# Patient Record
Sex: Male | Born: 1955
Health system: Southern US, Community
[De-identification: ages and names within clinical notes are randomized; demographics above are authoritative.]

## PROBLEM LIST (undated history)

## (undated) DIAGNOSIS — L0390 Cellulitis, unspecified: Secondary | ICD-10-CM

## (undated) DIAGNOSIS — E66811 Obesity, class 1: Secondary | ICD-10-CM

## (undated) DIAGNOSIS — N189 Chronic kidney disease, unspecified: Secondary | ICD-10-CM

## (undated) DIAGNOSIS — I10 Essential (primary) hypertension: Secondary | ICD-10-CM

## (undated) DIAGNOSIS — E119 Type 2 diabetes mellitus without complications: Secondary | ICD-10-CM

## (undated) DIAGNOSIS — I639 Cerebral infarction, unspecified: Secondary | ICD-10-CM

## (undated) DIAGNOSIS — G479 Sleep disorder, unspecified: Secondary | ICD-10-CM

## (undated) DIAGNOSIS — M109 Gout, unspecified: Secondary | ICD-10-CM

## (undated) DIAGNOSIS — E669 Obesity, unspecified: Secondary | ICD-10-CM

## (undated) HISTORY — DX: Essential (primary) hypertension: I10

## (undated) HISTORY — DX: Obesity, unspecified: E66.9

## (undated) HISTORY — PX: TONSILLECTOMY: SUR1361

## (undated) HISTORY — PX: OTHER SURGICAL HISTORY: SHX169

## (undated) HISTORY — DX: Cellulitis, unspecified: L03.90

## (undated) HISTORY — DX: Gout, unspecified: M10.9

## (undated) HISTORY — DX: Sleep disorder, unspecified: G47.9

## (undated) HISTORY — DX: Type 2 diabetes mellitus without complications: E11.9

## (undated) HISTORY — DX: Chronic kidney disease, unspecified: N18.9

## (undated) HISTORY — DX: Obesity, class 1: E66.811

---

## 2003-02-05 ENCOUNTER — Ambulatory Visit (HOSPITAL_BASED_OUTPATIENT_CLINIC_OR_DEPARTMENT_OTHER): Admission: RE | Admit: 2003-02-05 | Discharge: 2003-02-05 | Payer: Self-pay | Admitting: Family Medicine

## 2012-01-05 DIAGNOSIS — I639 Cerebral infarction, unspecified: Secondary | ICD-10-CM

## 2012-01-05 HISTORY — DX: Cerebral infarction, unspecified: I63.9

## 2012-10-02 ENCOUNTER — Ambulatory Visit: Payer: Self-pay | Admitting: Neurology

## 2016-02-08 DIAGNOSIS — T23261A Burn of second degree of back of right hand, initial encounter: Secondary | ICD-10-CM | POA: Diagnosis not present

## 2016-03-15 DIAGNOSIS — M5136 Other intervertebral disc degeneration, lumbar region: Secondary | ICD-10-CM | POA: Diagnosis not present

## 2016-03-15 DIAGNOSIS — M9905 Segmental and somatic dysfunction of pelvic region: Secondary | ICD-10-CM | POA: Diagnosis not present

## 2016-03-15 DIAGNOSIS — M9903 Segmental and somatic dysfunction of lumbar region: Secondary | ICD-10-CM | POA: Diagnosis not present

## 2016-03-15 DIAGNOSIS — M5442 Lumbago with sciatica, left side: Secondary | ICD-10-CM | POA: Diagnosis not present

## 2016-03-19 DIAGNOSIS — M5430 Sciatica, unspecified side: Secondary | ICD-10-CM | POA: Diagnosis not present

## 2016-03-19 DIAGNOSIS — M545 Low back pain: Secondary | ICD-10-CM | POA: Diagnosis not present

## 2016-04-26 DIAGNOSIS — I639 Cerebral infarction, unspecified: Secondary | ICD-10-CM | POA: Diagnosis not present

## 2016-04-26 DIAGNOSIS — N183 Chronic kidney disease, stage 3 (moderate): Secondary | ICD-10-CM | POA: Diagnosis not present

## 2016-04-26 DIAGNOSIS — I10 Essential (primary) hypertension: Secondary | ICD-10-CM | POA: Diagnosis not present

## 2016-04-26 DIAGNOSIS — G459 Transient cerebral ischemic attack, unspecified: Secondary | ICD-10-CM | POA: Diagnosis not present

## 2016-04-26 DIAGNOSIS — R270 Ataxia, unspecified: Secondary | ICD-10-CM | POA: Insufficient documentation

## 2016-04-26 DIAGNOSIS — E1122 Type 2 diabetes mellitus with diabetic chronic kidney disease: Secondary | ICD-10-CM | POA: Diagnosis not present

## 2016-04-26 DIAGNOSIS — R4189 Other symptoms and signs involving cognitive functions and awareness: Secondary | ICD-10-CM | POA: Diagnosis not present

## 2016-04-26 DIAGNOSIS — N179 Acute kidney failure, unspecified: Secondary | ICD-10-CM | POA: Diagnosis not present

## 2016-04-26 DIAGNOSIS — M109 Gout, unspecified: Secondary | ICD-10-CM | POA: Diagnosis not present

## 2016-04-26 DIAGNOSIS — R479 Unspecified speech disturbances: Secondary | ICD-10-CM | POA: Diagnosis not present

## 2016-04-26 DIAGNOSIS — F430 Acute stress reaction: Secondary | ICD-10-CM | POA: Diagnosis not present

## 2016-04-26 DIAGNOSIS — I668 Occlusion and stenosis of other cerebral arteries: Secondary | ICD-10-CM | POA: Diagnosis not present

## 2016-04-26 DIAGNOSIS — I129 Hypertensive chronic kidney disease with stage 1 through stage 4 chronic kidney disease, or unspecified chronic kidney disease: Secondary | ICD-10-CM | POA: Diagnosis not present

## 2016-04-26 DIAGNOSIS — R4781 Slurred speech: Secondary | ICD-10-CM | POA: Diagnosis not present

## 2016-04-26 DIAGNOSIS — I080 Rheumatic disorders of both mitral and aortic valves: Secondary | ICD-10-CM | POA: Diagnosis not present

## 2016-04-26 DIAGNOSIS — I517 Cardiomegaly: Secondary | ICD-10-CM | POA: Diagnosis not present

## 2016-04-26 DIAGNOSIS — I663 Occlusion and stenosis of cerebellar arteries: Secondary | ICD-10-CM | POA: Diagnosis not present

## 2016-04-26 DIAGNOSIS — R260 Ataxic gait: Secondary | ICD-10-CM | POA: Diagnosis not present

## 2016-04-26 DIAGNOSIS — I6522 Occlusion and stenosis of left carotid artery: Secondary | ICD-10-CM | POA: Diagnosis not present

## 2016-04-26 DIAGNOSIS — Z789 Other specified health status: Secondary | ICD-10-CM | POA: Diagnosis not present

## 2016-04-26 DIAGNOSIS — R410 Disorientation, unspecified: Secondary | ICD-10-CM | POA: Diagnosis not present

## 2016-04-26 DIAGNOSIS — E669 Obesity, unspecified: Secondary | ICD-10-CM | POA: Diagnosis not present

## 2016-04-26 DIAGNOSIS — Z6831 Body mass index (BMI) 31.0-31.9, adult: Secondary | ICD-10-CM | POA: Diagnosis not present

## 2016-04-26 DIAGNOSIS — G4733 Obstructive sleep apnea (adult) (pediatric): Secondary | ICD-10-CM | POA: Diagnosis not present

## 2016-04-26 DIAGNOSIS — G9389 Other specified disorders of brain: Secondary | ICD-10-CM | POA: Diagnosis not present

## 2016-04-27 DIAGNOSIS — I517 Cardiomegaly: Secondary | ICD-10-CM | POA: Diagnosis not present

## 2016-04-27 DIAGNOSIS — I639 Cerebral infarction, unspecified: Secondary | ICD-10-CM | POA: Diagnosis not present

## 2016-04-27 DIAGNOSIS — I080 Rheumatic disorders of both mitral and aortic valves: Secondary | ICD-10-CM | POA: Diagnosis not present

## 2016-04-27 DIAGNOSIS — R270 Ataxia, unspecified: Secondary | ICD-10-CM | POA: Diagnosis not present

## 2016-04-27 DIAGNOSIS — N179 Acute kidney failure, unspecified: Secondary | ICD-10-CM | POA: Diagnosis not present

## 2016-04-27 DIAGNOSIS — E119 Type 2 diabetes mellitus without complications: Secondary | ICD-10-CM | POA: Insufficient documentation

## 2016-04-27 DIAGNOSIS — E1122 Type 2 diabetes mellitus with diabetic chronic kidney disease: Secondary | ICD-10-CM | POA: Insufficient documentation

## 2016-04-27 DIAGNOSIS — R410 Disorientation, unspecified: Secondary | ICD-10-CM | POA: Diagnosis not present

## 2016-04-28 DIAGNOSIS — E669 Obesity, unspecified: Secondary | ICD-10-CM | POA: Insufficient documentation

## 2016-04-28 DIAGNOSIS — Z7289 Other problems related to lifestyle: Secondary | ICD-10-CM | POA: Insufficient documentation

## 2016-04-28 DIAGNOSIS — N179 Acute kidney failure, unspecified: Secondary | ICD-10-CM | POA: Diagnosis not present

## 2016-04-28 DIAGNOSIS — F430 Acute stress reaction: Secondary | ICD-10-CM | POA: Insufficient documentation

## 2016-04-28 DIAGNOSIS — Z789 Other specified health status: Secondary | ICD-10-CM | POA: Diagnosis not present

## 2016-04-28 DIAGNOSIS — R270 Ataxia, unspecified: Secondary | ICD-10-CM | POA: Diagnosis not present

## 2016-04-28 DIAGNOSIS — I10 Essential (primary) hypertension: Secondary | ICD-10-CM | POA: Insufficient documentation

## 2016-04-28 DIAGNOSIS — I639 Cerebral infarction, unspecified: Secondary | ICD-10-CM | POA: Diagnosis not present

## 2016-04-29 DIAGNOSIS — Z789 Other specified health status: Secondary | ICD-10-CM | POA: Diagnosis not present

## 2016-04-29 DIAGNOSIS — I672 Cerebral atherosclerosis: Secondary | ICD-10-CM | POA: Insufficient documentation

## 2016-04-29 DIAGNOSIS — N179 Acute kidney failure, unspecified: Secondary | ICD-10-CM | POA: Diagnosis not present

## 2016-04-29 DIAGNOSIS — R270 Ataxia, unspecified: Secondary | ICD-10-CM | POA: Diagnosis not present

## 2016-04-29 DIAGNOSIS — I639 Cerebral infarction, unspecified: Secondary | ICD-10-CM | POA: Diagnosis not present

## 2016-04-29 DIAGNOSIS — G4733 Obstructive sleep apnea (adult) (pediatric): Secondary | ICD-10-CM | POA: Insufficient documentation

## 2016-05-05 DIAGNOSIS — Z8673 Personal history of transient ischemic attack (TIA), and cerebral infarction without residual deficits: Secondary | ICD-10-CM | POA: Diagnosis not present

## 2016-05-05 DIAGNOSIS — Z6832 Body mass index (BMI) 32.0-32.9, adult: Secondary | ICD-10-CM | POA: Diagnosis not present

## 2016-05-05 DIAGNOSIS — E1165 Type 2 diabetes mellitus with hyperglycemia: Secondary | ICD-10-CM | POA: Diagnosis not present

## 2016-05-05 DIAGNOSIS — Z1389 Encounter for screening for other disorder: Secondary | ICD-10-CM | POA: Diagnosis not present

## 2016-05-05 DIAGNOSIS — I10 Essential (primary) hypertension: Secondary | ICD-10-CM | POA: Diagnosis not present

## 2016-05-27 DIAGNOSIS — M109 Gout, unspecified: Secondary | ICD-10-CM | POA: Diagnosis not present

## 2016-05-27 DIAGNOSIS — Z6831 Body mass index (BMI) 31.0-31.9, adult: Secondary | ICD-10-CM | POA: Diagnosis not present

## 2016-05-27 DIAGNOSIS — Q846 Other congenital malformations of nails: Secondary | ICD-10-CM | POA: Diagnosis not present

## 2016-05-27 DIAGNOSIS — G479 Sleep disorder, unspecified: Secondary | ICD-10-CM | POA: Diagnosis not present

## 2016-05-27 DIAGNOSIS — E7800 Pure hypercholesterolemia, unspecified: Secondary | ICD-10-CM | POA: Diagnosis not present

## 2016-05-27 DIAGNOSIS — R5383 Other fatigue: Secondary | ICD-10-CM | POA: Diagnosis not present

## 2016-05-27 DIAGNOSIS — I10 Essential (primary) hypertension: Secondary | ICD-10-CM | POA: Diagnosis not present

## 2016-06-08 DIAGNOSIS — D485 Neoplasm of uncertain behavior of skin: Secondary | ICD-10-CM | POA: Diagnosis not present

## 2016-06-08 DIAGNOSIS — L608 Other nail disorders: Secondary | ICD-10-CM | POA: Diagnosis not present

## 2016-06-08 DIAGNOSIS — L570 Actinic keratosis: Secondary | ICD-10-CM | POA: Diagnosis not present

## 2016-08-12 DIAGNOSIS — Z6833 Body mass index (BMI) 33.0-33.9, adult: Secondary | ICD-10-CM | POA: Diagnosis not present

## 2016-08-12 DIAGNOSIS — M109 Gout, unspecified: Secondary | ICD-10-CM | POA: Diagnosis not present

## 2016-08-16 DIAGNOSIS — Z6833 Body mass index (BMI) 33.0-33.9, adult: Secondary | ICD-10-CM | POA: Diagnosis not present

## 2016-08-16 DIAGNOSIS — Z0000 Encounter for general adult medical examination without abnormal findings: Secondary | ICD-10-CM | POA: Diagnosis not present

## 2016-08-16 DIAGNOSIS — R9431 Abnormal electrocardiogram [ECG] [EKG]: Secondary | ICD-10-CM | POA: Diagnosis not present

## 2016-09-10 DIAGNOSIS — E7800 Pure hypercholesterolemia, unspecified: Secondary | ICD-10-CM

## 2016-09-10 DIAGNOSIS — R9431 Abnormal electrocardiogram [ECG] [EKG]: Secondary | ICD-10-CM

## 2016-09-10 DIAGNOSIS — G479 Sleep disorder, unspecified: Secondary | ICD-10-CM

## 2016-09-10 DIAGNOSIS — I10 Essential (primary) hypertension: Secondary | ICD-10-CM

## 2016-09-14 ENCOUNTER — Encounter: Payer: Self-pay | Admitting: Cardiology

## 2016-09-14 ENCOUNTER — Ambulatory Visit (INDEPENDENT_AMBULATORY_CARE_PROVIDER_SITE_OTHER): Payer: BLUE CROSS/BLUE SHIELD | Admitting: Cardiology

## 2016-09-14 VITALS — BP 190/80 | HR 108 | Resp 14 | Ht 70.0 in | Wt 227.1 lb

## 2016-09-14 DIAGNOSIS — G479 Sleep disorder, unspecified: Secondary | ICD-10-CM

## 2016-09-14 DIAGNOSIS — R9431 Abnormal electrocardiogram [ECG] [EKG]: Secondary | ICD-10-CM

## 2016-09-14 DIAGNOSIS — E7800 Pure hypercholesterolemia, unspecified: Secondary | ICD-10-CM | POA: Diagnosis not present

## 2016-09-14 DIAGNOSIS — I10 Essential (primary) hypertension: Secondary | ICD-10-CM

## 2016-09-14 NOTE — Patient Instructions (Signed)
Medication Instructions:  Your physician recommends that you continue on your current medications as directed. Please refer to the Current Medication list given to you today.  Labwork: None   Testing/Procedures: Your physician has requested that you have an echocardiogram. Echocardiography is a painless test that uses sound waves to create images of your heart. It provides your doctor with information about the size and shape of your heart and how well your heart's chambers and valves are working. This procedure takes approximately one hour. There are no restrictions for this procedure.   Follow-Up: Your physician recommends that you schedule a follow-up appointment in: 3 weeks   Any Other Special Instructions Will Be Listed Below (If Applicable).  Please note that any paperwork needing to be filled out by the provider will need to be addressed at the front desk prior to seeing the provider. Please note that any paperwork FMLA, Disability or other documents regarding health condition is subject to a $25.00 charge that must be received prior to completion of paperwork in the form of a money order or check.    If you need a refill on your cardiac medications before your next appointment, please call your pharmacy.

## 2016-09-14 NOTE — Progress Notes (Signed)
Cardiology Consultation:    Date:  09/14/2016   ID:  [name] [name], DOB 18-Mar-1955, MRN [id number]  PCP:  Noni Saupeedding, John F. II, MD  Cardiologist:  Gypsy Balsamobert Jaquin Coy, MD   Referring MD: Noni Saupeedding, John F. II, MD   Chief Complaint  Patient presents with  . Abnormal ECG  Is doing well he's here to talk about abnormal EKG  History of Present Illness:    [name] [name] is a 61 y.o. male who is being seen today for the evaluation of Abnormal EKG at the request of Arvin Collardedding, John F. II, MD. Patient has a history of long-standing hypertension he tells me for at least 17 years, dyslipidemia, mild sleep apnea. It have his annual physical was fine to have abnormal EKG with some ischemic changes and he was referred to me for evaluation. He denies having tightness squeezing pressure burning chest but that the same time he tells me he does not exercise much. 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 He does not exercise on the radial basis. Years ago he was diagnosed with mild sleep apnea however no treatment initiated. He described to have some exertional shortness of breath as well as some swelling of lower extremities evening time. No dizziness no syncope.  Past Medical History:  Diagnosis Date  . Hypertension   . Obesity (BMI 30.0-34.9)   . Sleep disorder     Past Surgical History:  Procedure Laterality Date  . Arthroscopic knee surgery      Current Medications: Current Meds  Medication Sig  . allopurinol (ZYLOPRIM) 100 MG tablet Take 200 mg by mouth daily.  Marland Kitchen. ALPRAZolam (XANAX) 0.25 MG tablet Take 0.25 mg by mouth as needed for anxiety.  Marland Kitchen. amLODipine (NORVASC) 10 MG tablet Take 10 mg by mouth daily.  Marland Kitchen. aspirin 81 MG chewable tablet Chew 81 mg by mouth daily.  Marland Kitchen. lisinopril (PRINIVIL,ZESTRIL) 40 MG tablet Take 40 mg by mouth daily.  . meloxicam (MOBIC) 15 MG tablet Take 15 mg by mouth daily.  . metoprolol (TOPROL-XL) 200 MG 24 hr tablet Take 200 mg by mouth  daily.  . nitroGLYCERIN (NITROSTAT) 0.6 MG SL tablet Place 0.6 mg under the tongue every 5 (five) minutes as needed for chest pain.     Allergies:   Patient has no known allergies.   Social History   Social History  . Marital status: Unknown    Spouse name: N/A  . Number of children: N/A  . Years of education: N/A   Social History Main Topics  . Smoking status: Never Smoker  . Smokeless tobacco: Never Used  . Alcohol use Yes  . Drug use: No  . Sexual activity: Not Asked   Other Topics Concern  . None   Social History Narrative  . None     Family History: The patient's family history includes Hypertension in his father and mother. ROS:   Please see the history of present illness.    All 14 point review of systems negative except as described per history of present illness.  EKGs/Labs/Other Studies Reviewed:     Recent Labs: No results found for requested labs within last 8760 hours.  Recent Lipid Panel No results found for: CHOL, TRIG, HDL, CHOLHDL, VLDL, LDLCALC, LDLDIRECT  Physical Exam:    VS:  BP (!) 190/80   Pulse (!) 108   Resp 14   Ht 5\' 10"  (1.778 m)   Wt 227 lb 1.9 oz (103 kg)  BMI 32.59 kg/m     Wt Readings from Last 3 Encounters:  09/14/16 227 lb 1.9 oz (103 kg)     GEN:  Well nourished, well developed in no acute distress HEENT: Normal NECK: No JVD; No carotid bruits LYMPHATICS: No lymphadenopathy CARDIAC: RRR, no murmurs, no rubs, no gallops RESPIRATORY:  Clear to auscultation without rales, wheezing or rhonchi  ABDOMEN: Soft, non-tender, non-distended MUSCULOSKELETAL:  No edema; No deformity  SKIN: Warm and dry NEUROLOGIC:  Alert and oriented x 3 PSYCHIATRIC:  Normal affect   ASSESSMENT:    1. Benign essential hypertension   2. Abnormal EKG   3. Elevated cholesterol   4. Sleep disorder    PLAN:    In order of problems listed above:  1. Benign essential hypertension: His blood pressure is high today but this is first visit to  my office. I asked him to recheck his blood pressure on the radial basis and taken out of it. I will continue present medications which include metoprolol lisinopril as well as amlodipine. 2. Abnormal EKG: EKG will be repeated. Those changes on the EKG could be related to ischemia or left ventricle hypertrophy. I will ask him to have echocardiogram to assess left ventricular ejection fraction and left ventricle hypertrophy. I'll be also interested in looking for right ventricle pressures. 3. Lipidemia: I will call primary care physician to get his fasting lipid profile. He is not on any therapy. 4. Obstructive sleep apnea: Apparently mild does not have any treatment for it we'll talk about this in the future. He may benefit from CPAP mask if blood pressure is truly elevated and does some target organ damage like left ventricle hypertrophy.  I will see him in about 2-3 weeks. Hopefully by that time we'll have echocardiogram done, and also he will bring the blood pressure measurements so we can adjust his medications accordingly.   Medication Adjustments/Labs and Tests Ordered: Current medicines are reviewed at length with the patient today.  Concerns regarding medicines are outlined above.  No orders of the defined types were placed in this encounter.  No orders of the defined types were placed in this encounter.   Signed, Georgeanna Lea, MD, [facility] 09/14/2016 10:32 AM    [facility]

## 2016-09-22 ENCOUNTER — Ambulatory Visit (HOSPITAL_BASED_OUTPATIENT_CLINIC_OR_DEPARTMENT_OTHER)
Admission: RE | Admit: 2016-09-22 | Discharge: 2016-09-22 | Disposition: A | Discharge status: Home / Self Care | Payer: BLUE CROSS/BLUE SHIELD | Source: Ambulatory Visit | Attending: Cardiology | Admitting: Cardiology

## 2016-09-22 DIAGNOSIS — I081 Rheumatic disorders of both mitral and tricuspid valves: Secondary | ICD-10-CM | POA: Insufficient documentation

## 2016-09-22 DIAGNOSIS — R9431 Abnormal electrocardiogram [ECG] [EKG]: Secondary | ICD-10-CM | POA: Insufficient documentation

## 2016-10-05 ENCOUNTER — Ambulatory Visit: Payer: BLUE CROSS/BLUE SHIELD | Admitting: Cardiology

## 2016-10-08 ENCOUNTER — Ambulatory Visit: Payer: BLUE CROSS/BLUE SHIELD | Admitting: Cardiology

## 2016-10-14 ENCOUNTER — Ambulatory Visit (INDEPENDENT_AMBULATORY_CARE_PROVIDER_SITE_OTHER): Payer: BLUE CROSS/BLUE SHIELD | Admitting: Cardiology

## 2016-10-14 ENCOUNTER — Encounter: Payer: Self-pay | Admitting: Cardiology

## 2016-10-14 VITALS — BP 170/92 | HR 76 | Resp 14 | Ht 70.0 in | Wt 227.0 lb

## 2016-10-14 DIAGNOSIS — R0789 Other chest pain: Secondary | ICD-10-CM | POA: Insufficient documentation

## 2016-10-14 DIAGNOSIS — G479 Sleep disorder, unspecified: Secondary | ICD-10-CM | POA: Diagnosis not present

## 2016-10-14 DIAGNOSIS — I10 Essential (primary) hypertension: Secondary | ICD-10-CM | POA: Diagnosis not present

## 2016-10-14 DIAGNOSIS — R9431 Abnormal electrocardiogram [ECG] [EKG]: Secondary | ICD-10-CM | POA: Diagnosis not present

## 2016-10-14 DIAGNOSIS — E7800 Pure hypercholesterolemia, unspecified: Secondary | ICD-10-CM | POA: Diagnosis not present

## 2016-10-14 MED ORDER — ATORVASTATIN CALCIUM 20 MG PO TABS: 20.0000 mg | ORAL_TABLET | Freq: Every day | ORAL | 6 refills | Status: DC

## 2016-10-14 NOTE — Progress Notes (Signed)
Cardiology Office Note:    Date:  10/14/2016   ID:  [name] [name], DOB 02/03/1955, MRN [id number]  PCP:  Noni Saupe, MD  Cardiologist:  Gypsy Balsam, MD    Referring MD: Noni Saupe, MD   Chief Complaint  Patient presents with  . 3 week follow up  I'm feeling fine  History of Present Illness:    [name] [name] is a 61 y.o. male  with hypertension, abnormal EKG. He said he is feeling well according on was done which showed significant left ventricle hypertrophy. Clearly we need to improve management of his hypertension. I will ask him to have Chem-7 done today Chem-7 is okay we will initiate small dose of diuretic. If that will not be sufficient to get his blood pressure were supposed to be will try to readjust dose of clonidine. He is already on ACE inhibitor as well as calcium channel blocker and beta blocker. He described to have an fatigued quite easily and some on his sensation to chest, therefore I will schedule him to have a stress test. It will be done in form of stress echocardiogram. I reviewed his cholesterol. He needs to be at least moderate intensity stopped and I will initiate therapy with Lipitor 20 mg daily.  Past Medical History:  Diagnosis Date  . Hypertension   . Obesity (BMI 30.0-34.9)   . Sleep disorder     Past Surgical History:  Procedure Laterality Date  . Arthroscopic knee surgery      Current Medications: Current Meds  Medication Sig  . allopurinol (ZYLOPRIM) 100 MG tablet Take 200 mg by mouth daily.  Marland Kitchen amLODipine (NORVASC) 10 MG tablet Take 10 mg by mouth daily.  . cloNIDine (CATAPRES) 0.2 MG tablet Take 0.2 mg by mouth daily.  Marland Kitchen lisinopril (PRINIVIL,ZESTRIL) 40 MG tablet Take 40 mg by mouth daily.  . meloxicam (MOBIC) 15 MG tablet Take 15 mg by mouth daily.  . metoprolol (TOPROL-XL) 200 MG 24 hr tablet Take 200 mg by mouth daily.     Allergies:   Patient has no known allergies.   Social History   Social History  .  Marital status: Married    Spouse name: N/A  . Number of children: N/A  . Years of education: N/A   Social History Main Topics  . Smoking status: Never Smoker  . Smokeless tobacco: Never Used  . Alcohol use Yes  . Drug use: No  . Sexual activity: Not Asked   Other Topics Concern  . None   Social History Narrative  . None     Family History: The patient's family history includes Hypertension in his father and mother. ROS:   Please see the history of present illness.    All 14 point review of systems negative except as described per history of present illness  EKGs/Labs/Other Studies Reviewed:      Recent Labs: No results found for requested labs within last 8760 hours.  Recent Lipid Panel No results found for: CHOL, TRIG, HDL, CHOLHDL, VLDL, LDLCALC, LDLDIRECT  Physical Exam:    VS:  BP (!) 170/92   Pulse 76   Resp 14   Ht  (1.778 m)   Wt 227 lb (103 kg)   BMI 32.57 kg/m     Wt Readings from Last 3 Encounters:  10/14/16 227 lb (103 kg)  09/14/16 227 lb 1.9 oz (103 kg)     GEN:  Well nourished, well developed in no acute  distress HEENT: Normal NECK: No JVD; No carotid bruits LYMPHATICS: No lymphadenopathy CARDIAC: RRR, no murmurs, no rubs, no gallops RESPIRATORY:  Clear to auscultation without rales, wheezing or rhonchi  ABDOMEN: Soft, non-tender, non-distended MUSCULOSKELETAL:  No edema; No deformity  SKIN: Warm and dry LOWER EXTREMITIES: no swelling NEUROLOGIC:  Alert and oriented x 3 PSYCHIATRIC:  Normal affect   ASSESSMENT:    1. Benign essential hypertension   2. Elevated cholesterol   3. Abnormal EKG   4. Sleep disorder   5. Atypical chest pain    PLAN:    In order of problems listed above:  1. Benign essential hypertension: Chem-7 will be done if Chem-7 is 5 we'll initiate diuretic. 2. Elevated cholesterol: We will restart statin. 3. Abnormal EKG: Most likely left ventricle hypertrophy related but stressed this will be done to  rule out any inducible ischemia. 4. Sleep disorder: Followed by internal medicine team. 5. Atypical chest pain: Stressed test will be done.  I see him back in one month or sooner if he has a problem   Medication Adjustments/Labs and Tests Ordered: Current medicines are reviewed at length with the patient today.  Concerns regarding medicines are outlined above.  No orders of the defined types were placed in this encounter.  Medication changes: No orders of the defined types were placed in this encounter.   Signed, Georgeanna Lea, MD, [facility] 10/14/2016 9:30 AM    [facility]

## 2016-10-14 NOTE — Patient Instructions (Addendum)
Medication Instructions:  Your physician has recommended you make the following change in your medication:  1.) START Lipitor 20 mg daily.   Labwork: Your physician recommends that you have lab work today to check your Kidney function as well as sodium level and potassium.   Testing/Procedures: Your physician has requested that you have a stress echocardiogram. For further information please visit [email or web address] Please follow instruction sheet as given.   Follow-Up: Your physician recommends that you schedule a follow-up appointment in: 1 month   Any Other Special Instructions Will Be Listed Below (If Applicable).  Please note that any paperwork needing to be filled out by the provider will need to be addressed at the front desk prior to seeing the provider. Please note that any paperwork FMLA, Disability or other documents regarding health condition is subject to a $25.00 charge that must be received prior to completion of paperwork in the form of a money order or check.    If you need a refill on your cardiac medications before your next appointment, please call your pharmacy.

## 2016-10-15 LAB — BASIC METABOLIC PANEL
BUN/Creatinine Ratio: 17 (ref 9–20)
BUN: 23 mg/dL (ref 6–24)
CALCIUM: 9.4 mg/dL (ref 8.7–10.2)
CHLORIDE: 104 mmol/L (ref 96–106)
CO2: 27 mmol/L (ref 20–29)
Creatinine, Ser: 1.34 mg/dL — ABNORMAL HIGH (ref 0.76–1.27)
GFR calc non Af Amer: 58 mL/min/{1.73_m2} — ABNORMAL LOW (ref >59)
GFR, EST AFRICAN AMERICAN: 67 mL/min/{1.73_m2} (ref >59)
GLUCOSE: 177 mg/dL — AB (ref 65–99)
Potassium: 4.2 mmol/L (ref 3.5–5.2)
Sodium: 145 mmol/L — ABNORMAL HIGH (ref 134–144)

## 2016-11-09 DIAGNOSIS — R0603 Acute respiratory distress: Secondary | ICD-10-CM | POA: Diagnosis not present

## 2016-11-09 DIAGNOSIS — I130 Hypertensive heart and chronic kidney disease with heart failure and stage 1 through stage 4 chronic kidney disease, or unspecified chronic kidney disease: Secondary | ICD-10-CM | POA: Diagnosis not present

## 2016-11-09 DIAGNOSIS — Z79899 Other long term (current) drug therapy: Secondary | ICD-10-CM | POA: Diagnosis not present

## 2016-11-09 DIAGNOSIS — G8191 Hemiplegia, unspecified affecting right dominant side: Secondary | ICD-10-CM | POA: Diagnosis not present

## 2016-11-09 DIAGNOSIS — Z9181 History of falling: Secondary | ICD-10-CM | POA: Diagnosis not present

## 2016-11-09 DIAGNOSIS — M25461 Effusion, right knee: Secondary | ICD-10-CM | POA: Diagnosis not present

## 2016-11-09 DIAGNOSIS — E7800 Pure hypercholesterolemia, unspecified: Secondary | ICD-10-CM | POA: Diagnosis not present

## 2016-11-09 DIAGNOSIS — G459 Transient cerebral ischemic attack, unspecified: Secondary | ICD-10-CM | POA: Diagnosis not present

## 2016-11-09 DIAGNOSIS — R29818 Other symptoms and signs involving the nervous system: Secondary | ICD-10-CM | POA: Diagnosis not present

## 2016-11-09 DIAGNOSIS — M109 Gout, unspecified: Secondary | ICD-10-CM | POA: Diagnosis not present

## 2016-11-09 DIAGNOSIS — I6523 Occlusion and stenosis of bilateral carotid arteries: Secondary | ICD-10-CM | POA: Diagnosis not present

## 2016-11-09 DIAGNOSIS — Z8673 Personal history of transient ischemic attack (TIA), and cerebral infarction without residual deficits: Secondary | ICD-10-CM | POA: Diagnosis not present

## 2016-11-09 DIAGNOSIS — I6350 Cerebral infarction due to unspecified occlusion or stenosis of unspecified cerebral artery: Secondary | ICD-10-CM | POA: Diagnosis not present

## 2016-11-09 DIAGNOSIS — I5031 Acute diastolic (congestive) heart failure: Secondary | ICD-10-CM | POA: Diagnosis not present

## 2016-11-09 DIAGNOSIS — R29700 NIHSS score 0: Secondary | ICD-10-CM | POA: Diagnosis not present

## 2016-11-09 DIAGNOSIS — I10 Essential (primary) hypertension: Secondary | ICD-10-CM | POA: Diagnosis not present

## 2016-11-09 DIAGNOSIS — N183 Chronic kidney disease, stage 3 (moderate): Secondary | ICD-10-CM | POA: Diagnosis not present

## 2016-11-09 DIAGNOSIS — E785 Hyperlipidemia, unspecified: Secondary | ICD-10-CM | POA: Diagnosis not present

## 2016-11-09 DIAGNOSIS — E1122 Type 2 diabetes mellitus with diabetic chronic kidney disease: Secondary | ICD-10-CM | POA: Diagnosis not present

## 2016-11-09 DIAGNOSIS — I6789 Other cerebrovascular disease: Secondary | ICD-10-CM | POA: Diagnosis not present

## 2016-11-09 DIAGNOSIS — R0602 Shortness of breath: Secondary | ICD-10-CM | POA: Diagnosis not present

## 2016-11-09 DIAGNOSIS — I639 Cerebral infarction, unspecified: Secondary | ICD-10-CM | POA: Diagnosis not present

## 2016-11-09 DIAGNOSIS — Z7982 Long term (current) use of aspirin: Secondary | ICD-10-CM | POA: Diagnosis not present

## 2016-11-09 DIAGNOSIS — R0902 Hypoxemia: Secondary | ICD-10-CM | POA: Diagnosis not present

## 2016-11-09 DIAGNOSIS — M1A0790 Idiopathic chronic gout, unspecified ankle and foot, without tophus (tophi): Secondary | ICD-10-CM | POA: Diagnosis not present

## 2016-11-09 DIAGNOSIS — I160 Hypertensive urgency: Secondary | ICD-10-CM | POA: Diagnosis not present

## 2016-11-10 DIAGNOSIS — I6523 Occlusion and stenosis of bilateral carotid arteries: Secondary | ICD-10-CM | POA: Diagnosis not present

## 2016-11-10 DIAGNOSIS — I10 Essential (primary) hypertension: Secondary | ICD-10-CM | POA: Diagnosis not present

## 2016-11-10 DIAGNOSIS — I639 Cerebral infarction, unspecified: Secondary | ICD-10-CM | POA: Diagnosis not present

## 2016-11-10 DIAGNOSIS — I6789 Other cerebrovascular disease: Secondary | ICD-10-CM | POA: Diagnosis not present

## 2016-11-10 DIAGNOSIS — M109 Gout, unspecified: Secondary | ICD-10-CM | POA: Diagnosis not present

## 2016-11-10 DIAGNOSIS — E785 Hyperlipidemia, unspecified: Secondary | ICD-10-CM | POA: Diagnosis not present

## 2016-11-11 DIAGNOSIS — M109 Gout, unspecified: Secondary | ICD-10-CM | POA: Diagnosis not present

## 2016-11-11 DIAGNOSIS — E785 Hyperlipidemia, unspecified: Secondary | ICD-10-CM | POA: Diagnosis not present

## 2016-11-11 DIAGNOSIS — I10 Essential (primary) hypertension: Secondary | ICD-10-CM | POA: Diagnosis not present

## 2016-11-11 DIAGNOSIS — R0602 Shortness of breath: Secondary | ICD-10-CM | POA: Diagnosis not present

## 2016-11-11 DIAGNOSIS — R0603 Acute respiratory distress: Secondary | ICD-10-CM | POA: Diagnosis not present

## 2016-11-11 DIAGNOSIS — I639 Cerebral infarction, unspecified: Secondary | ICD-10-CM | POA: Diagnosis not present

## 2016-11-11 DIAGNOSIS — I6789 Other cerebrovascular disease: Secondary | ICD-10-CM | POA: Diagnosis not present

## 2016-11-11 DIAGNOSIS — M25461 Effusion, right knee: Secondary | ICD-10-CM | POA: Diagnosis not present

## 2016-11-12 ENCOUNTER — Inpatient Hospital Stay (HOSPITAL_COMMUNITY): Payer: BLUE CROSS/BLUE SHIELD

## 2016-11-12 ENCOUNTER — Inpatient Hospital Stay (HOSPITAL_COMMUNITY)
Admission: RE | Admit: 2016-11-12 | Discharge: 2016-11-23 | DRG: 057 | Disposition: A | Discharge status: Home / Self Care | Payer: BLUE CROSS/BLUE SHIELD | Source: Intra-hospital | Attending: Physical Medicine & Rehabilitation | Admitting: Physical Medicine & Rehabilitation

## 2016-11-12 ENCOUNTER — Encounter: Payer: Self-pay | Admitting: *Deleted

## 2016-11-12 ENCOUNTER — Encounter (HOSPITAL_COMMUNITY): Payer: Self-pay | Admitting: Physical Medicine and Rehabilitation

## 2016-11-12 DIAGNOSIS — E8770 Fluid overload, unspecified: Secondary | ICD-10-CM | POA: Diagnosis not present

## 2016-11-12 DIAGNOSIS — N183 Chronic kidney disease, stage 3 unspecified: Secondary | ICD-10-CM

## 2016-11-12 DIAGNOSIS — I129 Hypertensive chronic kidney disease with stage 1 through stage 4 chronic kidney disease, or unspecified chronic kidney disease: Secondary | ICD-10-CM | POA: Diagnosis not present

## 2016-11-12 DIAGNOSIS — F411 Generalized anxiety disorder: Secondary | ICD-10-CM | POA: Diagnosis not present

## 2016-11-12 DIAGNOSIS — G8191 Hemiplegia, unspecified affecting right dominant side: Secondary | ICD-10-CM | POA: Diagnosis not present

## 2016-11-12 DIAGNOSIS — R0789 Other chest pain: Secondary | ICD-10-CM | POA: Diagnosis not present

## 2016-11-12 DIAGNOSIS — E785 Hyperlipidemia, unspecified: Secondary | ICD-10-CM | POA: Diagnosis not present

## 2016-11-12 DIAGNOSIS — M25561 Pain in right knee: Secondary | ICD-10-CM | POA: Insufficient documentation

## 2016-11-12 DIAGNOSIS — R279 Unspecified lack of coordination: Secondary | ICD-10-CM | POA: Diagnosis not present

## 2016-11-12 DIAGNOSIS — M10061 Idiopathic gout, right knee: Secondary | ICD-10-CM | POA: Diagnosis present

## 2016-11-12 DIAGNOSIS — E119 Type 2 diabetes mellitus without complications: Secondary | ICD-10-CM | POA: Diagnosis not present

## 2016-11-12 DIAGNOSIS — Z791 Long term (current) use of non-steroidal anti-inflammatories (NSAID): Secondary | ICD-10-CM

## 2016-11-12 DIAGNOSIS — M1000 Idiopathic gout, unspecified site: Secondary | ICD-10-CM | POA: Diagnosis not present

## 2016-11-12 DIAGNOSIS — I63412 Cerebral infarction due to embolism of left middle cerebral artery: Secondary | ICD-10-CM | POA: Diagnosis present

## 2016-11-12 DIAGNOSIS — I10 Essential (primary) hypertension: Secondary | ICD-10-CM

## 2016-11-12 DIAGNOSIS — Z743 Need for continuous supervision: Secondary | ICD-10-CM | POA: Diagnosis not present

## 2016-11-12 DIAGNOSIS — R079 Chest pain, unspecified: Secondary | ICD-10-CM

## 2016-11-12 DIAGNOSIS — I69392 Facial weakness following cerebral infarction: Secondary | ICD-10-CM

## 2016-11-12 DIAGNOSIS — R1311 Dysphagia, oral phase: Secondary | ICD-10-CM | POA: Diagnosis not present

## 2016-11-12 DIAGNOSIS — R0602 Shortness of breath: Secondary | ICD-10-CM

## 2016-11-12 DIAGNOSIS — Z79899 Other long term (current) drug therapy: Secondary | ICD-10-CM | POA: Diagnosis not present

## 2016-11-12 DIAGNOSIS — I639 Cerebral infarction, unspecified: Secondary | ICD-10-CM | POA: Diagnosis not present

## 2016-11-12 DIAGNOSIS — I169 Hypertensive crisis, unspecified: Secondary | ICD-10-CM | POA: Diagnosis not present

## 2016-11-12 DIAGNOSIS — E1122 Type 2 diabetes mellitus with diabetic chronic kidney disease: Secondary | ICD-10-CM | POA: Diagnosis not present

## 2016-11-12 DIAGNOSIS — G8194 Hemiplegia, unspecified affecting left nondominant side: Secondary | ICD-10-CM

## 2016-11-12 DIAGNOSIS — Z8249 Family history of ischemic heart disease and other diseases of the circulatory system: Secondary | ICD-10-CM | POA: Diagnosis not present

## 2016-11-12 DIAGNOSIS — M10071 Idiopathic gout, right ankle and foot: Secondary | ICD-10-CM | POA: Diagnosis not present

## 2016-11-12 DIAGNOSIS — I69351 Hemiplegia and hemiparesis following cerebral infarction affecting right dominant side: Secondary | ICD-10-CM | POA: Diagnosis not present

## 2016-11-12 DIAGNOSIS — M109 Gout, unspecified: Secondary | ICD-10-CM | POA: Diagnosis not present

## 2016-11-12 DIAGNOSIS — M79609 Pain in unspecified limb: Secondary | ICD-10-CM | POA: Diagnosis not present

## 2016-11-12 HISTORY — DX: Cerebral infarction, unspecified: I63.9

## 2016-11-12 LAB — GLUCOSE, CAPILLARY
GLUCOSE-CAPILLARY: 139 mg/dL — AB (ref 65–99)
Glucose-Capillary: 173 mg/dL — ABNORMAL HIGH (ref 65–99)

## 2016-11-12 LAB — URINALYSIS, ROUTINE W REFLEX MICROSCOPIC
BILIRUBIN URINE: NEGATIVE
Glucose, UA: NEGATIVE mg/dL
HGB URINE DIPSTICK: NEGATIVE
KETONES UR: NEGATIVE mg/dL
Leukocytes, UA: NEGATIVE
Nitrite: NEGATIVE
PROTEIN: NEGATIVE mg/dL
SPECIFIC GRAVITY, URINE: 1.006 (ref 1.005–1.030)
pH: 5 (ref 5.0–8.0)

## 2016-11-12 MED ORDER — PROCHLORPERAZINE MALEATE 5 MG PO TABS: 5.0000 mg | ORAL_TABLET | Freq: Four times a day (QID) | ORAL | Status: DC | PRN

## 2016-11-12 MED ORDER — PROCHLORPERAZINE EDISYLATE 5 MG/ML IJ SOLN: 5.0000 mg | Freq: Four times a day (QID) | INTRAMUSCULAR | Status: DC | PRN

## 2016-11-12 MED ORDER — ASPIRIN EC 81 MG PO TBEC
81.0000 mg | DELAYED_RELEASE_TABLET | Freq: Every day | ORAL | Status: DC
  Administered 2016-11-13 – 2016-11-23 (×11): 81 mg via ORAL
  Filled 2016-11-12 (×11): qty 1

## 2016-11-12 MED ORDER — CLONIDINE HCL 0.1 MG PO TABS
0.1000 mg | ORAL_TABLET | ORAL | Status: DC | PRN
  Administered 2016-11-12 – 2016-11-21 (×4): 0.1 mg via ORAL
  Filled 2016-11-12 (×4): qty 1

## 2016-11-12 MED ORDER — FUROSEMIDE 10 MG/ML IJ SOLN
INTRAMUSCULAR | Status: AC
  Administered 2016-11-12: 40 mg
  Filled 2016-11-12: qty 4

## 2016-11-12 MED ORDER — ALLOPURINOL 100 MG PO TABS
100.0000 mg | ORAL_TABLET | Freq: Every day | ORAL | Status: DC
  Administered 2016-11-13: 100 mg via ORAL
  Filled 2016-11-12: qty 1

## 2016-11-12 MED ORDER — PROCHLORPERAZINE 25 MG RE SUPP: 12.5000 mg | Freq: Four times a day (QID) | RECTAL | Status: DC | PRN

## 2016-11-12 MED ORDER — BISACODYL 10 MG RE SUPP: 10.0000 mg | Freq: Every day | RECTAL | Status: DC | PRN

## 2016-11-12 MED ORDER — TRAZODONE HCL 50 MG PO TABS
25.0000 mg | ORAL_TABLET | Freq: Every evening | ORAL | Status: DC | PRN
  Administered 2016-11-15 – 2016-11-21 (×6): 50 mg via ORAL
  Filled 2016-11-12 (×6): qty 1

## 2016-11-12 MED ORDER — INSULIN ASPART 100 UNIT/ML ~~LOC~~ SOLN: 0.0000 [IU] | Freq: Every day | SUBCUTANEOUS | Status: DC

## 2016-11-12 MED ORDER — ALUM & MAG HYDROXIDE-SIMETH 200-200-20 MG/5ML PO SUSP: 30.0000 mL | ORAL | Status: DC | PRN

## 2016-11-12 MED ORDER — ACETAMINOPHEN 325 MG PO TABS: 325.0000 mg | ORAL_TABLET | ORAL | Status: DC | PRN

## 2016-11-12 MED ORDER — POLYETHYLENE GLYCOL 3350 17 G PO PACK: 17.0000 g | PACK | Freq: Every day | ORAL | Status: DC | PRN

## 2016-11-12 MED ORDER — ENOXAPARIN SODIUM 40 MG/0.4ML ~~LOC~~ SOLN
40.0000 mg | SUBCUTANEOUS | Status: DC
Start: 2016-11-13 — End: 2016-11-23
  Administered 2016-11-13 – 2016-11-23 (×11): 40 mg via SUBCUTANEOUS
  Filled 2016-11-12 (×11): qty 0.4

## 2016-11-12 MED ORDER — LISINOPRIL 10 MG PO TABS
10.0000 mg | ORAL_TABLET | Freq: Every day | ORAL | Status: DC
  Administered 2016-11-13 – 2016-11-17 (×5): 10 mg via ORAL
  Filled 2016-11-12 (×5): qty 1

## 2016-11-12 MED ORDER — GUAIFENESIN-DM 100-10 MG/5ML PO SYRP: 5.0000 mL | ORAL_SOLUTION | Freq: Four times a day (QID) | ORAL | Status: DC | PRN

## 2016-11-12 MED ORDER — FLEET ENEMA 7-19 GM/118ML RE ENEM: 1.0000 | ENEMA | Freq: Once | RECTAL | Status: DC | PRN

## 2016-11-12 MED ORDER — METOPROLOL SUCCINATE ER 50 MG PO TB24
100.0000 mg | ORAL_TABLET | Freq: Every day | ORAL | Status: DC
  Administered 2016-11-13 – 2016-11-23 (×11): 100 mg via ORAL
  Filled 2016-11-12 (×11): qty 2

## 2016-11-12 MED ORDER — GLIMEPIRIDE 1 MG PO TABS
1.0000 mg | ORAL_TABLET | Freq: Every day | ORAL | Status: DC
  Administered 2016-11-13 – 2016-11-23 (×11): 1 mg via ORAL
  Filled 2016-11-12 (×11): qty 1

## 2016-11-12 MED ORDER — DIPHENHYDRAMINE HCL 12.5 MG/5ML PO ELIX
12.5000 mg | ORAL_SOLUTION | Freq: Four times a day (QID) | ORAL | Status: DC | PRN
  Filled 2016-11-12: qty 10

## 2016-11-12 MED ORDER — TRAMADOL HCL 50 MG PO TABS
50.0000 mg | ORAL_TABLET | Freq: Four times a day (QID) | ORAL | Status: DC | PRN
  Administered 2016-11-13 – 2016-11-15 (×7): 50 mg via ORAL
  Filled 2016-11-12 (×7): qty 1

## 2016-11-12 MED ORDER — INSULIN ASPART 100 UNIT/ML ~~LOC~~ SOLN
0.0000 [IU] | Freq: Three times a day (TID) | SUBCUTANEOUS | Status: DC
  Administered 2016-11-13 – 2016-11-22 (×8): 1 [IU] via SUBCUTANEOUS

## 2016-11-12 MED ORDER — FUROSEMIDE 10 MG/ML IJ SOLN
40.0000 mg | Freq: Once | INTRAMUSCULAR | Status: AC
  Administered 2016-11-12: 40 mg via INTRAVENOUS

## 2016-11-12 NOTE — H&P (Signed)
Physical Medicine and Rehabilitation Admission H&P    CC: Stroke with significant decline in mobility and ability to carry out ADLs.    HPI: [name] [name] is a 61 year old male with history of HTN, gout, prior CVA, medication non-compliance who was admitted to [facility] on 11/09/16 with right sided weakness, inability to walk, fall, word finding deficits and right facial droop. History taken from chart review and patient. BP reported to be 240/120 per EMS. He was started on IVF for question AKI with BUN/Scr 24/1.4. MRI brain done revealing acute left internal capsule lacunar infarct and subacute small left frontal white matter infarct and old left> right basal ganglia infarcts and mild parenchymal volume loss. Cardiac echo done showing EF 55-60% with moderate to severe AR and diastolic dysfunction. Carotid dopplers were negative for significant ICA stenosis. Low dose ASA recommended for thrombotic stroke due to small vessel disease and medications added for management of labile blood pressures. He was found to have elevated BS and new diagnosis of diabetes. Swallow evaluation showed mild oral phase dysphagia and diet modified to dysphagia 3 with chopped meats.  Patient with left facial weakness, left sided weakness with balance deficits and has had significant decline in mobility and ability to carry out ADLs. CIR recommended by rehab team. Upon arrival, patient noted to have chest pain with SOB.  Review of Systems  Constitutional: Negative for chills.  HENT: Negative for hearing loss and tinnitus.   Eyes: Negative for blurred vision and double vision.  Respiratory: Positive for shortness of breath.   Cardiovascular: Positive for chest pain and leg swelling.  Gastrointestinal: Negative for constipation, heartburn and nausea.  Genitourinary: Negative for dysuria and urgency.  Musculoskeletal: Positive for joint pain (right knee). Negative for myalgias.  Skin: Negative for rash.  Neurological:  Positive for speech change, focal weakness and weakness. Negative for dizziness and headaches.  Psychiatric/Behavioral: The patient is not nervous/anxious and does not have insomnia.   All other systems reviewed and are negative.     Past Medical History:  Diagnosis Date  . CVA (cerebral vascular accident) (HCC) 2014  . Hypertension   . Obesity (BMI 30.0-34.9)   . Sleep disorder     Past Surgical History:  Procedure Laterality Date  . Arthroscopic knee surgery    . TONSILLECTOMY      Family History  Problem Relation Age of Onset  . Hypertension Mother   . Hypertension Father   . Colon cancer Paternal Grandmother     Social History:  Lives with fiancee. Works as a Production designer, theatre/television/film for [employer]. He reports that  has never smoked. he has never used smokeless tobacco. He reports that he drinks alcohol on rare occasions.  He reports that he does not use drugs.    Allergies: No Known Allergies    Medications Prior to Admission  Medication Sig Dispense Refill  . allopurinol (ZYLOPRIM) 100 MG tablet Take 200 mg by mouth daily.    Marland Kitchen amLODipine (NORVASC) 10 MG tablet Take 10 mg by mouth daily.    Marland Kitchen atorvastatin (LIPITOR) 20 MG tablet Take 1 tablet (20 mg total) by mouth daily. 30 tablet 6  . cloNIDine (CATAPRES) 0.2 MG tablet Take 0.2 mg by mouth daily.    Marland Kitchen lisinopril (PRINIVIL,ZESTRIL) 40 MG tablet Take 40 mg by mouth daily.    . meloxicam (MOBIC) 15 MG tablet Take 15 mg by mouth daily.    . metoprolol (TOPROL-XL) 200 MG 24 hr tablet Take 200 mg by  mouth daily.      Drug Regimen Review  Drug regimen was reviewed and remains appropriate with no significant issues identified  Home: One level home with 3 STE   Functional History: Independent without AD PTA. Works full time.   Functional Status:  Mobility: Min assist for bed mobility Min assist for transfers Mod assist + 2 to ambulate 585' with RW   ADL:   Cognition:     There were no vitals taken for this  visit. Physical Exam  Nursing note and vitals reviewed. Constitutional: He is oriented to person, place, and time. He appears well-developed and well-nourished. He appears distressed.  HENT:  Head: Normocephalic and atraumatic.  Mouth/Throat: Oropharynx is clear and moist.  Eyes: Conjunctivae and EOM are normal. Pupils are equal, round, and reactive to light.  Neck: Normal range of motion. Neck supple.  Cardiovascular: Regular rhythm. Tachycardia present.  Respiratory: No stridor. He has decreased breath sounds in the left lower field. He has wheezes in the left middle field and the left lower field.  Increased WOB +Rales  GI: Soft. Bowel sounds are normal. He exhibits no distension. There is no tenderness.  Musculoskeletal:  Min edema right knee and discomfort with ROM.  Multiple abrasions right shin.   Neurological: He is alert and oriented to person, place, and time.  Right facial weakness with soft and measured speech. Motor:RUE/RLE: 4/5 proximal to distal with apraxia LUE/LLE: 5/5 proximal to distal Hyperreflexic B/l LE  Sensation diminished to light touch RLE  Skin: Skin is warm and dry. He is not diaphoretic.  Psychiatric: His behavior is normal. Judgment and thought content normal. His mood appears anxious.    Results for orders placed or performed during the hospital encounter of 11/12/16 (from the past 48 hour(s))  Glucose, capillary     Status: Abnormal   Collection Time: 11/12/16  5:23 PM  Result Value Ref Range   Glucose-Capillary 139 (H) 65 - 99 mg/dL   Labs:  BMET: Na- 045142  K- 3.5, BUN - 25    SCr- 1.4  Gluc- 158 Hgb A1C- 7.3  TSH- 3.87  \ Lipid panel: Chol - 190     LDL - 129   HDL - 33  Trig - 139  VLDL - 27.8      Medical Problem List and Plan: 1.  Left facial weakness, left sided weakness with balance deficits secondary to left internal capsule CVA with history of CVA. 2.  DVT Prophylaxis/Anticoagulation: Pharmaceutical: Lovenox 3. Pain Management: will  order tramadol and/or tylenol prn 4. Mood: LCSW to follow for evaluation and support.  5. Neuropsych: This patient is capable of making decisions on his own behalf. 6. Skin/Wound Care: routine pressure relief measures.  7. Fluids/Electrolytes/Nutrition: Maintain adequate nutrition/hydration. 8. Right knee pain: Post fall in hospital--X rays reported to be negative. Local measures.  9. HTN: Monitor BP bid--continue metoprolol and lisinopril. Permissive HTN to prevent hypoperfusion.  10. CKD?: baseline SCr- 1.4. Recheck in am 11. New diagnosis T2DM: Monitor BS ac/hs. Will start low dose amaryl and titrate as indicated. Will use SSI for elevated BS and tighter control. Consult dietitian for dietary education.  12. Fluid overload/Chest pain: had SOB last night and diuresed per reports. Will check CXR as still with SOB. IV Lasix and ECG ordered.   Post Admission Physician Evaluation: 1. Preadmission assessment reviewed and changes made below. 2. Functional deficits secondary  to left internal capsule CVA with history of CVA.. 3. Patient is admitted to receive  collaborative, interdisciplinary care between the physiatrist, rehab nursing staff, and therapy team. 4. Patient's level of medical complexity and substantial therapy needs in context of that medical necessity cannot be provided at a lesser intensity of care such as a SNF. 5. Patient has experienced substantial functional loss from his/her baseline which was documented above under the "Functional History" and "Functional Status" headings.  Judging by the patient's diagnosis, physical exam, and functional history, the patient has potential for functional progress which will result in measurable gains while on inpatient rehab.  These gains will be of substantial and practical use upon discharge  in facilitating mobility and self-care at the household level. 6. Physiatrist will provide 24 hour management of medical needs as well as oversight of the  therapy plan/treatment and provide guidance as appropriate regarding the interaction of the two. 7. 24 hour rehab nursing will assist with safety, disease management, pain management and patient education  and help integrate therapy concepts, techniques,education, etc. PT will assess and treat for/with: Lower extremity strength, range of motion, stamina, balance, functional mobility, safety, adaptive techniques and equipment, coping skills, pain control, education.   Goals are: Mod I/Supervision. 8. OT will assess and treat for/with: ADL's, functional mobility, safety, upper extremity strength, adaptive techniques and equipment, ego support, and community reintegration.   Goals are: Mod I/Supervision. Therapy may proceed with showering this patient. 9. SLP will assess and treat for/with: swallowing.  Goals are: Ind. 10. Case Management and Social Worker will assess and treat for psychological issues and discharge planning. 11. Team conference will be held weekly to assess progress toward goals and to determine barriers to discharge. 12. Patient will receive at least 3 hours of therapy per day at least 5 days per week. 13. ELOS: 10-14 days.       14. Prognosis:  good  Maryla MorrowAnkit Patel, MD, ABPMR Jacquelynn CreeLove, Pamela S, [place]-C 11/12/2016

## 2016-11-12 NOTE — PMR Pre-admission (Signed)
[place] [facility] PMR Admission Coordinator Pre-Admission Assessment  Patient: [name] [name] is an 61 y.o., male MRN: [id number] DOB: 1955-01-10 Height: '5\' 10"'  (177.8 cm) Weight: 95.3 kg (210 lb)  Insurance Information HMO:     PPO: yes     PCP:      IPA:      80/20:      OTHER:  PRIMARY: [employer]      Policy#: [id number]      Subscriber: pt CM NameJeannene Patella      Phone#: [phone] general CM number     Fax#: direct call for clinicals. Approved for 7 days Pre-Cert#: [id number]      Employer: [employer] Benefits:  Phone #: [phone]     Name: 11/11/2016 Eff. Date: 05/05/2015     Deduct: none      Out of Pocket Max: $4200      Life Max: none CIR: $750 co pay per admission      SNF: $750 co pay per admission 45 days Outpatient: $60 co pay per visit     Co-Pay: 45 visits combined Home Health: 100%      Co-Pay: 40 days per year DME: 80%     Co-Pay: 20% Providers:  in network  SECONDARY: none       Medicaid Application Date:       Case Manager:  Disability Application Date:       Case Worker:   Emergency Facilities manager Information    Name Relation Home Work Mobile   Vanwart,Angela Spouse [phone]        Current Medical History  Patient Admitting Diagnosis: Left internal capsule infarct  History of Present Illness: Patient presented 11/09/[facility] to [facility] with facial droop, slurred speech, ataxia with falls and right sided weakness. Initial BP in the ER 240/120. Patient evaluated  By [facility] Neurology who recommended workup. Blood pressure was brought down rapidly overnight with oral agents. Deficits were slow to resolve. MRI brain showed internal capsule acute stroke left side. Carotid ultrasound and echo were unremarkable. Patent attempted to stand without assistance 11/10/16 and fell without injuries. No afib on monitor, one 5 beat run VT. He has had a 2 week Holter within the past year, Neuro recommended no repeat. Patient to continue on lisinopril and  metoprolol and start amlodipine. Start Metformin. Prn labetalol for SBP greater than 180. Continue ASA 81 mg. Continue statin. For his gout to continue allopurinol. New diagnosis DM. Hgb A1c 7.3 and fasting sugars >125 for past two days. Began Metformin. Patient has Chronic Kidney Disease stage 2-3. DVT prophylaxis with Lovenox.   Patient's medical record from [facility] has been reviewed by the rehabilitation admission coordinator and physician. Admissions Coordinator has completed an onsite bedside assessment.   NIH Stroke scale:not available  Past Medical History  Past Medical History:  Diagnosis Date  . Hypertension   . Obesity (BMI 30.0-34.9)   . Sleep disorder     Family History   family history includes Hypertension in his father and mother.  Prior Rehab/Hospitalizations Has the patient had major surgery during 100 days prior to admission? No    Current Medications See MAR  Patients Current Diet:  Mechanical soft with thin liquids  Precautions / Restrictions Precautions Precautions: Fall Restrictions Weight Bearing Restrictions: No   Has the patient had 2 or more falls or a fall with injury in the past year?No  Prior Activity Level Community (5-7x/wk): Independent and driving; Chartered certified accountant for [employer]  Level Self Care: Did the patient need help bathing, dressing, using the toilet or eating?  Independent  Indoor Mobility: Did the patient need assistance with walking from room to room (with or without device)? Independent  Stairs: Did the patient need assistance with internal or external stairs (with or without device)? Independent  Functional Cognition: Did the patient need help planning regular tasks such as shopping or remembering to take medications? Independent  Home Assistive Devices / Equipment Home Assistive Devices/Equipment: None  Prior Device Use: Indicate devices/aids used by the patient prior to current illness,  exacerbation or injury? None of the above   Prior Functional Level Current Functional Level  Bed Mobility  Independent  Min assist   Transfers  Independent  Mod assist.    Mobility - Walk/Wheelchair  Independent  Min assist to mod assist of 2 with RW to correct balance loss (Lateral lean to the right). Pt also required verbal and tactile cues for RLE and heel strike. Pt's ambulation greatly improved in speed and stability after 20 feet   Upper Body Dressing  Independent  Min assist   Lower Body Dressing  Independent  Mod assist   Grooming  Independent  Min assist   Eating/Drinking  Independent  Min assist   Toilet Transfer  Independent  Mod assist   Bladder Continence   continent  continent   Bowel Management  continent  continent   Stair Climbing   Independent  (not attempted)   Communication  independent  dysthartric   Memory  intact  intact   Cooking/Meal Prep  independent      Housework  independent    Money Management  independent    Driving   independent      Special needs/care consideration BiPAP/CPAP  N/a CPM  N/a Continuous Drip IV  N/a Dialysis  N/a Life Vest  N/a Oxygen  N/a Special Bed  N/a Trach Size n/a Wound Vac n/a Skin  N/a Bowel mgmt: continent Bladder mgmt:  continent Diabetic mgmt new diagnosis DM. Hgb A1c 7.3 Poor safety awareness Fell at home pta, and fell in hospital attempting to get up unattended  Previous Home Environment Living Arrangements: Spouse/significant other(Angela, fiance and 39 year old foster child, 9)  Lives With: Significant other(foster son) Available Help at Discharge: Available 24 hours/day(Angela works but may be able to take some time off) Type of Home: House Home Layout: One level Home Access: Stairs to enter Entrance Stairs-Rails: None Technical brewer of Steps: 3 Bathroom Shower/Tub: Public librarian, Architectural technologist: Standard Bathroom Accessibility:  Yes How Accessible: Accessible via walker [place]: No  Discharge Living Setting Plans for Discharge Living Setting: Patient's home, Lives with (comment)(fiance, Levada Dy, and 67 year old foster son, Tommy) Type of Home at Discharge: House Discharge Home Layout: One level Discharge Home Access: Stairs to enter Entrance Stairs-Rails: None Entrance Stairs-Number of Steps: 3 Discharge Bathroom Shower/Tub: Tub/shower unit, Curtain Discharge Bathroom Toilet: Standard Discharge Bathroom Accessibility: Yes How Accessible: Accessible via walker Does the patient have any problems obtaining your medications?: No  Social/Family/Support Systems Patient Roles: Partner, 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[employer]) [place]: Levada Dy [phone] Anticipated Caregiver: Levada Dy Anticipated Caregiver's Contact Information: see above Ability/Limitations of Caregiver: works Banker Availability: 24/7 Discharge Plan Discussed with Primary Caregiver: Yes Is Caregiver In Agreement with Plan?: Yes Does Caregiver/Family have Issues with Lodging/Transportation while Pt is in Rehab?: No  Goals/Additional Needs Patient/Family Goal for Rehab: Mod I to superivison with PT, OT, and SLP Expected length of  stay: ELOS 10- 14 days Special Service Needs: Patient fell on day of admission at home; has since fallen in Hospital Additional Information: [place] complains of right knee soreness due to initial fall pta Pt/Family Agrees to Admission and willing to participate: Yes Program Orientation Provided & Reviewed with Pt/Caregiver Including Roles  & Responsibilities: Yes  Patient Condition: I have reviewed all medical records from [facility], spoke with Case Manager, Attending MD as well as met with patient at Bedside. patient will benefit from ongoing PT, OT, and SLP therapies provided in the coordinated team approach as offered in an Inpatient Acute Therapy admission. He will  receive 3 hours per day of therapy as well as rehabilatative MD and Nursing interventions. He is currently overall min to mod assist with all adls. We will admit to Acute inpatient Rehabilitation today.   Preadmission Screen Completed By:  Cleatrice Burke, 11/12/2016 11:01 AM ______________________________________________________________________   Discussed status with Dr. Posey Pronto  on  11/12/2016 at  1250 and received telephone approval for admission today.  Admission Coordinator:  Cleatrice Burke, time  1250 Date  11/12/2016   Assessment/Plan: Diagnosis: Left internal capsule infarct  1. Does the need for close, 24 hr/day  Medical supervision in concert with the patient's rehab needs make it unreasonable for this patient to be served in a less intensive setting? Yes  2. Co-Morbidities requiring supervision/potential complications: gout, DM, Chronic Kidney Disease 3. Due to safety, disease management and patient education, does the patient require 24 hr/day rehab nursing? Yes 4. Does the patient require coordinated care of a physician, rehab nurse, PT (1-2 hrs/day, 5 days/week) and OT (1-2 hrs/day, 5 days/week) to address physical and functional deficits in the context of the above medical diagnosis(es)? Yes Addressing deficits in the following areas: balance, endurance, locomotion, strength, transferring, bathing, dressing, toileting and psychosocial support 5. Can the patient actively participate in an intensive therapy program of at least 3 hrs of therapy 5 days a week? Yes 6. The potential for patient to make measurable gains while on inpatient rehab is excellent 7. Anticipated functional outcomes upon discharge from inpatients are: modified independent and supervision PT, modified independent and supervision OT, n/a SLP 8. Estimated rehab length of stay to reach the above functional goals is: 10-14 days. 9. Does the patient have adequate social supports to accommodate these  discharge functional goals? Yes 10. Anticipated D/C setting: Home 11. Anticipated post D/C treatments: HH therapy and Home excercise program 12. Overall Rehab/Functional Prognosis: good    RECOMMENDATIONS: This patient's condition is appropriate for continued rehabilitative care in the following setting: CIR Patient has agreed to participate in recommended program. Yes Note that insurance prior authorization may be required for reimbursement for recommended care.  Delice Lesch, MD, ABPMR Cleatrice Burke 11/12/2016

## 2016-11-12 NOTE — Progress Notes (Signed)
Patient and family were informed about rehab [rocess including patient safety plan and rehab booklet.

## 2016-11-13 ENCOUNTER — Inpatient Hospital Stay (HOSPITAL_COMMUNITY): Payer: BLUE CROSS/BLUE SHIELD

## 2016-11-13 ENCOUNTER — Inpatient Hospital Stay (HOSPITAL_COMMUNITY): Payer: BLUE CROSS/BLUE SHIELD | Admitting: Physical Therapy

## 2016-11-13 DIAGNOSIS — I69351 Hemiplegia and hemiparesis following cerebral infarction affecting right dominant side: Principal | ICD-10-CM

## 2016-11-13 LAB — CBC WITH DIFFERENTIAL/PLATELET
Basophils Absolute: 0 10*3/uL (ref 0.0–0.1)
Basophils Relative: 0 %
Eosinophils Absolute: 0.1 10*3/uL (ref 0.0–0.7)
Eosinophils Relative: 1 %
HEMATOCRIT: 42.1 % (ref 39.0–52.0)
HEMOGLOBIN: 14.4 g/dL (ref 13.0–17.0)
LYMPHS ABS: 1.6 10*3/uL (ref 0.7–4.0)
Lymphocytes Relative: 15 %
MCH: 29.5 pg (ref 26.0–34.0)
MCHC: 34.2 g/dL (ref 30.0–36.0)
MCV: 86.3 fL (ref 78.0–100.0)
MONO ABS: 1.2 10*3/uL — AB (ref 0.1–1.0)
MONOS PCT: 10 %
NEUTROS ABS: 8.3 10*3/uL — AB (ref 1.7–7.7)
NEUTROS PCT: 74 %
Platelets: 178 10*3/uL (ref 150–400)
RBC: 4.88 MIL/uL (ref 4.22–5.81)
RDW: 14.3 % (ref 11.5–15.5)
WBC: 11.1 10*3/uL — ABNORMAL HIGH (ref 4.0–10.5)

## 2016-11-13 LAB — COMPREHENSIVE METABOLIC PANEL
ALBUMIN: 3 g/dL — AB (ref 3.5–5.0)
ALK PHOS: 63 U/L (ref 38–126)
ALT: 24 U/L (ref 17–63)
ANION GAP: 7 (ref 5–15)
AST: 20 U/L (ref 15–41)
BUN: 24 mg/dL — ABNORMAL HIGH (ref 6–20)
CALCIUM: 8.6 mg/dL — AB (ref 8.9–10.3)
CHLORIDE: 105 mmol/L (ref 101–111)
CO2: 27 mmol/L (ref 22–32)
Creatinine, Ser: 1.38 mg/dL — ABNORMAL HIGH (ref 0.61–1.24)
GFR calc non Af Amer: 54 mL/min — ABNORMAL LOW (ref >60)
GLUCOSE: 136 mg/dL — AB (ref 65–99)
Potassium: 3.4 mmol/L — ABNORMAL LOW (ref 3.5–5.1)
Sodium: 139 mmol/L (ref 135–145)
Total Bilirubin: 2 mg/dL — ABNORMAL HIGH (ref 0.3–1.2)
Total Protein: 6.1 g/dL — ABNORMAL LOW (ref 6.5–8.1)

## 2016-11-13 LAB — GLUCOSE, CAPILLARY
GLUCOSE-CAPILLARY: 96 mg/dL (ref 65–99)
Glucose-Capillary: 131 mg/dL — ABNORMAL HIGH (ref 65–99)
Glucose-Capillary: 111 mg/dL — ABNORMAL HIGH (ref 65–99)
Glucose-Capillary: 120 mg/dL — ABNORMAL HIGH (ref 65–99)

## 2016-11-13 MED ORDER — ALBUTEROL SULFATE (2.5 MG/3ML) 0.083% IN NEBU
3.0000 mL | INHALATION_SOLUTION | RESPIRATORY_TRACT | Status: DC | PRN
  Administered 2016-11-14: 3 mL via RESPIRATORY_TRACT
  Filled 2016-11-13: qty 3

## 2016-11-13 MED ORDER — DICLOFENAC SODIUM 1 % TD GEL
2.0000 g | Freq: Four times a day (QID) | TRANSDERMAL | Status: DC
  Administered 2016-11-13 – 2016-11-17 (×12): 2 g via TOPICAL
  Filled 2016-11-13: qty 100

## 2016-11-13 NOTE — Evaluation (Addendum)
Physical Therapy Assessment and Plan  Patient Details  Name: [name] [name] MRN: [id number] Date of Birth: 1955/02/26  PT Diagnosis: Abnormality of gait, Difficulty walking, Hemiparesis dominant, Impaired cognition, Muscle weakness and Pain in R knee and ankle Rehab Potential: Good ELOS: 10 to 14 days   Today's Date: 11/13/2016 PT Individual Time: 1254-1410 PT Individual Time Calculation (min): 76 min    Problem List:  Patient Active Problem List   Diagnosis Date Noted  . Stroke due to embolism of left middle cerebral artery ([place]) 11/12/2016  . Shortness of breath   . SOB (shortness of breath)   . Left hemiparesis ([place])   . Acute pain of right knee   . Benign essential HTN   . Stage 3 chronic kidney disease ([place])   . Newly diagnosed diabetes ([place])   . Chest pain   . Atypical chest pain 10/14/2016  . Abnormal EKG 09/10/2016  . Sleep disorder 09/10/2016  . Benign essential hypertension 09/10/2016  . Elevated cholesterol 09/10/2016    Past Medical History:  Past Medical History:  Diagnosis Date  . CVA (cerebral vascular accident) ([place]) 2014  . Hypertension   . Obesity (BMI 30.0-34.9)   . Sleep disorder    Past Surgical History:  Past Surgical History:  Procedure Laterality Date  . Arthroscopic knee surgery    . TONSILLECTOMY      Assessment & Plan Clinical Impression: Patient is a 61 y.o. year old male with recent admission to the hospital on 11-09-16 with history of HTN, gout, prior CVA, medication non-compliance who was admitted with right sided weakness, inability to walk, fall, word finding deficits and right facial droop. History taken from chart review and patient. BP reported to be 240/120 per EMS. He was started on IVF for question AKI with BUN/Scr 24/1.4. MRI brain done revealing acute left internal capsule lacunar infarct and subacute small left frontal white matter infarct and old left> right basal ganglia infarcts and mild parenchymal volume loss. Cardiac echo  done showing EF 55-60% with moderate to severe AR and diastolic dysfunction. Carotid dopplers were negative for significant ICA stenosis. Low dose ASA recommended for thrombotic stroke due to small vessel disease and medications added for management of labile blood pressures. He was found to have elevated BS and new diagnosis of diabetes. Swallow evaluation showed mild oral phase dysphagia and diet modified to dysphagia 3 with chopped meats.  Patient with left facial weakness, left sided weakness with balance deficits and has had significant decline in mobility and ability to carry out ADLs. CIR recommended by rehab team. Upon arrival, patient noted to have chest pain with SOB.    Patient transferred to CIR on 11/12/2016 .   Patient currently requires mod with mobility secondary to muscle weakness and muscle paralysis, decreased cardiorespiratoy endurance, impaired timing and sequencing, decreased coordination and decreased motor planning, decreased safety awareness and decreased memory and decreased standing balance, decreased postural control, hemiplegia and decreased balance strategies.  Prior to hospitalization, patient was independent  with mobility and lived with Significant other in a House home.  Home access is 3Stairs to enter.  Patient will benefit from skilled PT intervention to maximize safe functional mobility, minimize fall risk and decrease caregiver burden for planned discharge home with 24 hour supervision.  Anticipate patient will benefit from follow up [place] at discharge.  PT - End of Session Activity Tolerance: Tolerates 10 - 20 min activity with multiple rests Endurance Deficit: Yes Endurance Deficit Description: Decreased safety noted with  sit to stand after prolonged activity. PT Assessment Rehab Potential (ACUTE/IP ONLY): Good PT Barriers to Discharge: Decreased caregiver support;Inaccessible home environment PT Patient demonstrates impairments in the following area(s):  Balance;Endurance;Motor;Pain;Perception;Safety PT Transfers Functional Problem(s): Bed Mobility;Bed to Chair;Car;Furniture PT Locomotion Functional Problem(s): Ambulation;Stairs PT Plan PT Intensity: Minimum of 1-2 x/day ,45 to 90 minutes PT Frequency: 5 out of 7 days PT Duration Estimated Length of Stay: 10 to 14 days PT Treatment/Interventions: Ambulation/gait training;Balance/vestibular training;Cognitive remediation/compensation;Community reintegration;DME/adaptive equipment instruction;Disease management/prevention;Discharge planning;Functional mobility training;Neuromuscular re-education;Pain management;Patient/family education;Stair training;UE/LE Strength taining/ROM;UE/LE Coordination activities;Therapeutic Exercise;Wheelchair propulsion/positioning;Therapeutic Activities;Psychosocial support PT Transfers Anticipated Outcome(s): Mod I PT Locomotion Anticipated Outcome(s): Mod I PT Recommendation Recommendations for Other Services: Therapeutic Recreation consult Therapeutic Recreation Interventions: Outing/community reintergration;Kitchen group;Stress management Follow Up Recommendations: Outpatient PT;Home health PT Patient destination: Home Equipment Recommended: Rolling walker with 5" wheels;Cane  Skilled Therapeutic Intervention Session today focused on improving R UE coordination and strength through propelling w/c with B UE.  Additionally worked on decreasing knee pain and increasing R LE with nu-step x 10 min with B UE/LE.  Also worked on ambulation with manual cues from PT to control R knee flexion during midstance of gait cycle.  After nu-step, pt reports pain 6/10 in R knee. Prior to session, reports 7/10, however, declines wanting meds.  Following session, pt left up in chair with call bell in reach and needs met.  Vitals pre: 158/89; HR: 78 bpm.  Nursing notified of R ankle pain and is to discuss with physician in AM.  PT  Evaluation Precautions/Restrictions Precautions Precautions: Fall Precaution Comments: (R knee and ankle pain.  h/o R ankle gout.  R knee buckles.) Restrictions Weight Bearing Restrictions: No General Chart Reviewed: Yes Response to Previous Treatment: Patient with no complaints from previous session. Family/Caregiver Present: No Vital Signs   Home Living/Prior Functioning Home Living Available Help at Discharge: Available PRN/intermittently Type of Home: House Home Access: Stairs to enter [employer] of Steps: 3 Entrance Stairs-Rails: None Home Layout: One level  Lives With: Significant other Prior Function Level of Independence: Independent with basic ADLs;Independent with transfers;Independent with gait;Independent with homemaking with ambulation  Able to Take Stairs?: Reciprically Driving: Yes Vocation: Full time employment Vision/Perception  Perception Perception: Impaired(mild R inattention with R foot placement during transfers) Praxis Praxis: Intact  Cognition Overall Cognitive Status: Impaired/Different from baseline Arousal/Alertness: Awake/alert Orientation Level: Oriented X4 Attention: Sustained Sustained Attention: Appears intact Memory: Impaired Memory Impairment: Decreased recall of new information;Retrieval deficit;Storage deficit Awareness: Appears intact Problem Solving: Impaired Problem Solving Impairment: Functional complex;Verbal complex Safety/Judgment: Impaired(Impulsive (mild)) Comments: (Scored 3/5 on mini-cog - delayed recall 1/3 words only) Sensation Sensation Light Touch: Appears Intact Proprioception: Appears Intact Coordination Gross Motor Movements are Fluid and Coordinated: No Fine Motor Movements are Fluid and Coordinated: No Coordination and Movement Description: Decreased R UE/LE coordination with RAMPS and mild dysmetria on R > L with finger to nose Motor  Motor Motor: Hemiplegia Motor - Skilled Clinical  Observations: (Mild R hemi)      Trunk/Postural Assessment  Cervical Assessment Cervical Assessment: Exceptions to WFL(Fwd head) Thoracic Assessment Thoracic Assessment: Exceptions to WFL(Rounded shldr) Lumbar Assessment Lumbar Assessment: Exceptions to WFL(Inc post pelvic tile) Postural Control Postural Control: Deficits on evaluation(Impaired postural control during standing)  Balance Balance Balance Assessed: Yes Dynamic Sitting Balance Dynamic Sitting - Balance Support: Feet supported Dynamic Sitting - Level of Assistance: 4: Min assist Dynamic Sitting Balance - Compensations: (Reaching for w/c locks) Dynamic Standing Balance Dynamic Standing - Balance Support: Bilateral upper  extremity supported Dynamic Standing - Level of Assistance: 4: Min assist Dynamic Standing - Balance Activities: Lateral lean/weight shifting Dynamic Standing - Comments: (Decreased weight shift over R LE during function) Extremity Assessment  RUE Assessment RUE Assessment: Exceptions to WFL(R finger abd: 4/5; R wrist extension: 4+/5; Otherwise 5/5 in B UE) LUE Assessment LUE Assessment: Within Functional Limits RLE Assessment RLE Assessment: Exceptions to WFL(Limited R knee flexion/extension with increased swelling present.  Also increased swelling in R ankle;  R knee extension: 3-/5; R ankle DF: 5/5 in available range) LLE Assessment LLE Assessment: Within Functional Limits   See Function Navigator for Current Functional Status.   Refer to Care Plan for Long Term Goals  Recommendations for other services: Therapeutic Recreation  Pet therapy, Kitchen group, Stress management and Outing/community reintegration  Discharge Criteria: Patient will be discharged from PT if patient refuses treatment 3 consecutive times without medical reason, if treatment goals not met, if there is a change in medical status, if patient makes no progress towards goals or if patient is discharged from hospital.  The  above assessment, treatment plan, treatment alternatives and goals were discussed and mutually agreed upon: by patient  Quinette Hentges Hilario Quarry 11/13/2016, 3:52 PM

## 2016-11-13 NOTE — Evaluation (Addendum)
Occupational Therapy Assessment and Plan  Patient Details  Name: [name] [name] MRN: [id number] Date of Birth: December 24, 1955  OT Diagnosis: acute pain, cognitive deficits, hemiplegia affecting dominant side, muscle weakness (generalized) and pain in joint Rehab Potential:   ELOS: 10-14   Today's Date: 11/13/2016 OT Individual Time: 5361-4431 OT Individual Time Calculation (min): 57 min     Problem List:  Patient Active Problem List   Diagnosis Date Noted  . Stroke due to embolism of left middle cerebral artery ([place]) 11/12/2016  . Shortness of breath   . SOB (shortness of breath)   . Left hemiparesis ([place])   . Acute pain of right knee   . Benign essential HTN   . Stage 3 chronic kidney disease ([place])   . Newly diagnosed diabetes ([place])   . Chest pain   . Atypical chest pain 10/14/2016  . Abnormal EKG 09/10/2016  . Sleep disorder 09/10/2016  . Benign essential hypertension 09/10/2016  . Elevated cholesterol 09/10/2016    Past Medical History:  Past Medical History:  Diagnosis Date  . CVA (cerebral vascular accident) ([place]) 2014  . Hypertension   . Obesity (BMI 30.0-34.9)   . Sleep disorder    Past Surgical History:  Past Surgical History:  Procedure Laterality Date  . Arthroscopic knee surgery    . TONSILLECTOMY      Assessment & Plan Clinical Impression: Patient currently requires mod with basic self-care skills secondary to muscle weakness, decreased cardiorespiratoy endurance, decreased coordination and decreased motor planning, decreased safety awareness and delayed processing and decreased standing balance, decreased postural control, hemiplegia and decreased balance strategies.  Prior to hospitalization, patient could complete ADL/IADL with independent .  Patient will benefit from skilled intervention to increase independence with basic self-care skills and increase level of independence with iADL prior to discharge home with care partner.  Anticipate patient will  require 24 hour supervision and follow up outpatient.  OT - End of Session Activity Tolerance: Tolerates 30+ min activity with multiple rests Endurance Deficit: Yes OT Assessment Rehab Potential (ACUTE ONLY): Good OT Barriers to Discharge: Other (comments) OT Patient demonstrates impairments in the following area(s): Balance;Cognition;Endurance;Motor;Pain;Safety OT Basic ADL's Functional Problem(s): Eating;Grooming;Bathing;Dressing;Toileting OT Transfers Functional Problem(s): Toilet;Tub/Shower OT Plan OT Intensity: Minimum of 1-2 x/day, 45 to 90 minutes OT Frequency: 5 out of 7 days OT Duration/Estimated Length of Stay: 10-14 OT Treatment/Interventions: Balance/vestibular training;Discharge planning;Pain management;Self Care/advanced ADL retraining;Therapeutic Activities;UE/LE Coordination activities;Visual/perceptual remediation/compensation;Therapeutic Exercise;Patient/family education;Functional mobility training;Disease mangement/prevention;Cognitive remediation/compensation;Community reintegration;DME/adaptive equipment instruction;Neuromuscular re-education;Psychosocial support;UE/LE Strength taining/ROM;Wheelchair propulsion/positioning OT Self Feeding Anticipated Outcome(s): MOD I OT Basic Self-Care Anticipated Outcome(s): MOD I OT Toileting Anticipated Outcome(s):  MOD I toilet S shower OT Bathroom Transfers Anticipated Outcome(s): MOD I toilet S shower OT Recommendation Patient destination: Home Follow Up Recommendations: Outpatient OT Equipment Recommended: Tub/shower bench;To be determined;3 in 1 bedside comode   Skilled Therapeutic Intervention 1:1, Pt educated on role/purpose of OT, goals, ELOS, and CIR. Pt completes bathing and dressing at shower level. Pt supine>EOB with min A for trunk elevation. Pt stand pivot transfer with MOD A lifting assistance EOB>w/c<>TTB and R knee buckling d/t pain. Pt reporting 8/10 pain in R knee and RN aware. Pt bathes seated on shower seat  with VC for lateral lean technique to wash buttocks. Pt dresses UB with supervision and LB with min A for threading RLE into pants/underwear despite hemi technique. Pt able to identify he threaded BLE into same pant leg, however unable to fix himself. Pt able to sit to stand  with MOD A and advance pants past hips. Pt able to hold toothbrush in R hand, however requires cues to move hand to brush instead of head. Pt may benefit from red foam handle until grip strength improves. Exited session with pt seated in w/c with call light in reach and RN entering room.   OT Evaluation Precautions/Restrictions  Precautions Precautions: Fall Restrictions Weight Bearing Restrictions: No General Chart Reviewed: Yes Vital Signs Therapy Vitals BP: (!) 175/88 Pain   Home Living/Prior Functioning Home Living Available Help at Discharge: Available 24 hours/day Type of Home: House Home Access: Stairs to enter [employer] of Steps: 3 Entrance Stairs-Rails: None Home Layout: One level Bathroom Shower/Tub: Tub/shower unit, Industrial/product designer: Yes  Lives With: Significant other ADL   Vision Baseline Vision/History: Wears glasses Wears Glasses: Reading only Patient Visual Report: No change from baseline Vision Assessment?: No apparent visual deficits Perception  Perception: Impaired(mild R inattention) Praxis Praxis: Intact Cognition Overall Cognitive Status: Impaired/Different from baseline Arousal/Alertness: Awake/alert Year: 2018 Month: November Day of Week: Correct Immediate Memory Recall: Sock;Blue;Bed Memory Recall: Sock;Blue;Bed Memory Recall Sock: Without Cue Memory Recall Blue: Without Cue Memory Recall Bed: Without Cue Attention: Sustained Sustained Attention: Appears intact Safety/Judgment: Impaired Sensation Sensation Light Touch: Appears Intact Hot/Cold: Appears Intact Coordination Gross Motor Movements are Fluid and  Coordinated: No Fine Motor Movements are Fluid and Coordinated: No Motor  Motor Motor: Hemiplegia Motor - Skilled Clinical Observations: mild R hemi Mobility  Transfers Transfers: Sit to Stand;Stand to Sit Sit to Stand: 3: Mod assist Sit to Stand Details (indicate cue type and reason): R knee blocking Stand to Sit: 3: Mod assist  Trunk/Postural Assessment  Cervical Assessment Cervical Assessment: Exceptions to WFL(head forward) Thoracic Assessment Thoracic Assessment: Exceptions to WFL(rounded shoulders) Lumbar Assessment Lumbar Assessment: Exceptions to WFL(post pelvic tilt) Postural Control Postural Control: Deficits on evaluation(delayed)  Balance Balance Balance Assessed: Yes Dynamic Sitting Balance Dynamic Sitting - Level of Assistance: 4: Min assist Dynamic Sitting Balance - Compensations: LOB L threading sock Dynamic Standing Balance Dynamic Standing - Level of Assistance: 3: Mod assist Dynamic Standing - Comments: advancing pants past hips Extremity/Trunk Assessment RUE Assessment RUE Assessment: Exceptions to WFL(full ROM decreased ROM/strength 2/2 hemiplegia) LUE Assessment LUE Assessment: Within Functional Limits   See Function Navigator for Current Functional Status.   Refer to Care Plan for Long Term Goals  Recommendations for other services: Therapeutic Recreation  Pet therapy, Kitchen group and Outing/community reintegration   Discharge Criteria: Patient will be discharged from OT if patient refuses treatment 3 consecutive times without medical reason, if treatment goals not met, if there is a change in medical status, if patient makes no progress towards goals or if patient is discharged from hospital.  The above assessment, treatment plan, treatment alternatives and goals were discussed and mutually agreed upon: by patient  Tonny Branch 11/13/2016, 8:01 AM

## 2016-11-13 NOTE — Evaluation (Addendum)
Speech Language Pathology Assessment and Plan  Patient Details  Name: [name] [name] MRN: [id number] Date of Birth: [birth year]  SLP Diagnosis: Cognitive Impairments;Dysphagia  Rehab Potential: Good ELOS: 10-14 days    Today's Date: 11/13/2016 SLP Individual Time: 0803-0900 SLP Individual Time Calculation (min): 57 min   Problem List:  Patient Active Problem List   Diagnosis Date Noted  . Stroke due to embolism of left middle cerebral artery ([place]) 11/12/2016  . Shortness of breath   . SOB (shortness of breath)   . Left hemiparesis ([name])   . Acute pain of right knee   . Benign essential HTN   . Stage 3 chronic kidney disease ([place])   . Newly diagnosed diabetes ([place])   . Chest pain   . Atypical chest pain 10/14/2016  . Abnormal EKG 09/10/2016  . Sleep disorder 09/10/2016  . Benign essential hypertension 09/10/2016  . Elevated cholesterol 09/10/2016   Past Medical History:  Past Medical History:  Diagnosis Date  . CVA (cerebral vascular accident) ([place]) 2014  . Hypertension   . Obesity (BMI 30.0-34.9)   . Sleep disorder    Past Surgical History:  Past Surgical History:  Procedure Laterality Date  . Arthroscopic knee surgery    . TONSILLECTOMY      Assessment / Plan / Recommendation Clinical Impression [name] [name] is a 61 year old male with history of HTN, gout, prior CVA, medication non-compliance who was admitted to [facility] on 11/09/16 with right sided weakness, inability to walk, fall, word finding deficits and right facial droop. History taken from chart review and patient. BP reported to be 240/120 per EMS. He was started on IVF for question AKI with BUN/Scr 24/1.4. MRI brain done revealing acute left internal capsule lacunar infarct and subacute small left frontal white matter infarct and old left> right basal ganglia infarcts and mild parenchymal volume loss. Cardiac echo done showing EF 55-60% with moderate to severe AR and diastolic dysfunction. Carotid dopplers were  negative for significant ICA stenosis. Low dose ASA recommended for thrombotic stroke due to small vessel disease and medications added for management of labile blood pressures. He was found to have elevated BS and new diagnosis of diabetes. Swallow evaluation showed mild oral phase dysphagia and diet modified to dysphagia 3 with chopped meats. Patient with left facial weakness, left sided weakness with balance deficits and has had significant decline in mobility and ability to carry out ADLs. CIR recommended by rehab team. Upon arrival, patient noted to have chest pain with SOB.  Pt admitted to CIR on 11/12/2016 and evaluated on 11/13/2016 for cognitive linguistic and swallow ability. Pt presented with mild oral dysphagia and oral motor impairment on right side, with prolonged mastication due to reduce ROM/weakness. Pt demonstrated PO consumption of regular textured foods with slight prolonged mastication requiring liquid wash, dys 3 textured foods with no noted impairment, no impairment with thin via cup/straw and no s/s aspiration. SLP recommends dys 3 and thin liquid diet with goals to advance solids. Pt presents with minimum impairment in cognitive linguistic skills pertaining to speech intelligibility in conversation due to mild dysarthria, recall of novel information, word finding skills, anticipatory awareness and semi-complex problem solving skills, which was further supported by a score of 22 out of 30 on MOCA version 7.1 (n=>26.) Pt would benefit from skilled ST services to maximize functional independence and reduce burden of care in order to return to Mod I level.   Skilled Therapeutic Interventions  Skilled ST services focused on cognitive skills. SLP facilitated semi-complex problem solving skills utilizing subsections of the ALFA, daily math problem and medication management, pt demonstrated ability at Mod I level. SLP instructed pt that they will work on personal medication management,  because pt stopped taking medication prior to stroke, pt agreed. Pt was left in room with call bell within reach.    SLP Assessment  Patient will need skilled Speech Lanaguage Pathology Services during CIR admission    Recommendations  SLP Diet Recommendations: Dysphagia 3 (Mech soft);Thin Liquid Administration via: Straw;Cup Medication Administration: Whole meds with liquid Supervision: Patient able to self feed Compensations: Minimize environmental distractions;Slow rate;Small sips/bites Postural Changes and/or Swallow Maneuvers: Seated upright 90 degrees Oral Care Recommendations: Oral care BID Patient destination: Home Follow up Recommendations: None    SLP Frequency 3 to 5 out of 7 days   SLP Duration  SLP Intensity  SLP Treatment/Interventions 10-14 days  Minumum of 1-2 x/day, 30 to 90 minutes  Cognitive remediation/compensation;Cueing hierarchy;Environmental controls;Dysphagia/aspiration precaution training    Pain Pain Assessment Pain Assessment: No/denies pain Pain Score: 6   Prior Functioning Cognitive/Linguistic Baseline: Information not available(family not present to provide information) Type of Home: House  Lives With: Significant other Available Help at Discharge: Available 24 hours/day Vocation: Full time employment  Function:  Eating Eating   Modified Consistency Diet: No Eating Assist Level: Set up assist for   Eating Set Up Assist For: Opening containers       Cognition Comprehension Comprehension assist level: Follows basic conversation/direction with no assist  Expression   Expression assist level: Expresses basic 90% of the time/requires cueing < 10% of the time.  Social Interaction Social Interaction assist level: Interacts appropriately with others with medication or extra time (anti-anxiety, antidepressant).  Problem Solving Problem solving assist level: Solves basic problems with no assist  Memory Memory assist level: Recognizes or  recalls 50 - 74% of the time/requires cueing 25 - 49% of the time   Short Term Goals: Week 1: SLP Short Term Goal 1 (Week 1): Pt will demonstrate trials of regular textured foods with efficient mastication without overt s/s aspiration to demonstrate readiness for diet advancement. SLP Short Term Goal 2 (Week 1): Pt will utilize speech intelligiblity strategies in conversation with supervision A verbal and question cues to achieve 90% intelligiblity.  SLP Short Term Goal 3 (Week 1): Pt will demonstrate word finding strategies in semi-complex functional tasks with supervision A verbal and question cues.  SLP Short Term Goal 4 (Week 1): Pt will use external memory aid to recall new semi-complex information with supervision A verbal and question cues.  SLP Short Term Goal 5 (Week 1): Pt will demonstrate functional probelm solving skills in semi-complex tasks at Mod I level.  SLP Short Term Goal 6 (Week 1): Pt will demonstrate anticipatorey awareness and identify 3 tasks he can particpate in at home safely with supervision A verbal and question cues.  Refer to Care Plan for Long Term Goals  Recommendations for other services: None   Discharge Criteria: Patient will be discharged from SLP if patient refuses treatment 3 consecutive times without medical reason, if treatment goals not met, if there is a change in medical status, if patient makes no progress towards goals or if patient is discharged from hospital.  The above assessment, treatment plan, treatment alternatives and goals were discussed and mutually agreed upon: by patient  MADISON  CRATCH 11/13/2016, 12:35 PM  

## 2016-11-13 NOTE — Progress Notes (Signed)
Subjective/Complaints: Complains of right knee pain.  He states he fell during a stroke.  X-rays at [facility] were reportedly negative. Shortness of breath improved after IV Lasix.  Patient denies history of asthma or COPD.  Reviewed echocardiogram results EJ fraction 65% Also uses his fiancs albuterol inhaler as needed for shortness of breath.  Denies history of asthma or COPD non-smoker.  He states that he only uses the inhaler very occasionally.  Has used it here in the hospital.  Review of systems negative for chest pain shortness of breath nausea vomiting constipation  Objective: Vital Signs: Blood pressure (!) 175/88, pulse 89, temperature 97.9 F (36.6 C), temperature source Oral, resp. rate 18, height _0  (1.778 m), weight 107.5 kg (237 lb), SpO2 93 %. Dg Chest Port 1 View  Result Date: 11/12/2016 CLINICAL DATA:  Short of breath EXAM: PORTABLE CHEST 1 VIEW COMPARISON:  CT and x-ray 11/11/2016 FINDINGS: Improvement in bilateral airspace disease. Bibasilar airspace disease remains. Probable clearing heart failure. No effusion. Heart size upper normal. IMPRESSION: Improving bilateral airspace disease compatible with improving heart failure. Electronically Signed   By: Franchot Gallo M.D.   On: 11/12/2016 20:26   Results for orders placed or performed during the hospital encounter of 11/12/16 (from the past 72 hour(s))  Glucose, capillary     Status: Abnormal   Collection Time: 11/12/16  5:23 PM  Result Value Ref Range   Glucose-Capillary 139 (H) 65 - 99 mg/dL  Glucose, capillary     Status: Abnormal   Collection Time: 11/12/16  9:11 PM  Result Value Ref Range   Glucose-Capillary 173 (H) 65 - 99 mg/dL  Urinalysis, Routine w reflex microscopic     Status: Abnormal   Collection Time: 11/12/16  9:28 PM  Result Value Ref Range   Color, Urine STRAW (A) YELLOW   APPearance CLEAR CLEAR   Specific Gravity, Urine 1.006 1.005 - 1.030   pH 5.0 5.0 - 8.0   Glucose, UA NEGATIVE  NEGATIVE mg/dL   Hgb urine dipstick NEGATIVE NEGATIVE   Bilirubin Urine NEGATIVE NEGATIVE   Ketones, ur NEGATIVE NEGATIVE mg/dL   Protein, ur NEGATIVE NEGATIVE mg/dL   Nitrite NEGATIVE NEGATIVE   Leukocytes, UA NEGATIVE NEGATIVE  Comprehensive metabolic panel     Status: Abnormal   Collection Time: 11/13/16  5:07 AM  Result Value Ref Range   Sodium 139 135 - 145 mmol/L   Potassium 3.4 (L) 3.5 - 5.1 mmol/L   Chloride 105 101 - 111 mmol/L   CO2 27 22 - 32 mmol/L   Glucose, Bld 136 (H) 65 - 99 mg/dL   BUN 24 (H) 6 - 20 mg/dL   Creatinine, Ser 1.38 (H) 0.61 - 1.24 mg/dL   Calcium 8.6 (L) 8.9 - 10.3 mg/dL   Total Protein 6.1 (L) 6.5 - 8.1 g/dL   Albumin 3.0 (L) 3.5 - 5.0 g/dL   AST 20 15 - 41 U/L   ALT 24 17 - 63 U/L   Alkaline Phosphatase 63 38 - 126 U/L   Total Bilirubin 2.0 (H) 0.3 - 1.2 mg/dL   GFR calc non Af Amer 54 (L) >60 mL/min   GFR calc Af Amer >60 >60 mL/min    Comment: (NOTE) The eGFR has been calculated using the CKD EPI equation. This calculation has not been validated in all clinical situations. eGFR's persistently <60 mL/min signify possible Chronic Kidney Disease.    Anion gap 7 5 - 15  CBC WITH DIFFERENTIAL  Status: Abnormal   Collection Time: 11/13/16  5:07 AM  Result Value Ref Range   WBC 11.1 (H) 4.0 - 10.5 K/uL   RBC 4.88 4.22 - 5.81 MIL/uL   Hemoglobin 14.4 13.0 - 17.0 g/dL   HCT 42.1 39.0 - 52.0 %   MCV 86.3 78.0 - 100.0 fL   MCH 29.5 26.0 - 34.0 pg   MCHC 34.2 30.0 - 36.0 g/dL   RDW 14.3 11.5 - 15.5 %   Platelets 178 150 - 400 K/uL   Neutrophils Relative % 74 %   Neutro Abs 8.3 (H) 1.7 - 7.7 K/uL   Lymphocytes Relative 15 %   Lymphs Abs 1.6 0.7 - 4.0 K/uL   Monocytes Relative 10 %   Monocytes Absolute 1.2 (H) 0.1 - 1.0 K/uL   Eosinophils Relative 1 %   Eosinophils Absolute 0.1 0.0 - 0.7 K/uL   Basophils Relative 0 %   Basophils Absolute 0.0 0.0 - 0.1 K/uL  Glucose, capillary     Status: Abnormal   Collection Time: 11/13/16  6:27 AM   Result Value Ref Range   Glucose-Capillary 131 (H) 65 - 99 mg/dL     HEENT: normal Cardio: RRR and no murmur Resp: CTA B/L and unlabored GI: BS positive and Non distended nontender Extremity:  No Edema Skin:   Wound abrasion healing R lateral knee Neuro: Alert/Oriented, Cranial Nerve Abnormalities Right CNVII, Abnormal Motor 3/5 right deltoid, bicep, tricep, finger flexors, 4 at the right hip flexor knee extensor 3- at the ankle plantar flexor 2- at the ankle dorsiflexor. and Dysarthric Musc/Skel:  Other Minimal pain with knee endrange flexion.  Has full range of motion.  No evidence of knee effusion.  No erythema.  There is mild tenderness over the patella, no significant tenderness over the quad tendon or the patellar tendon. General no acute distress   Assessment/Plan: 1. Functional deficits secondary to right hemiparesis secondary to left IC posterior limb infarct which require 3+ hours per day of interdisciplinary therapy in a comprehensive inpatient rehab setting. Physiatrist is providing close team supervision and 24 hour management of active medical problems listed below. Physiatrist and rehab team continue to assess barriers to discharge/monitor patient progress toward functional and medical goals. FIM:       Function - Toileting Toileting steps completed by patient: Adjust clothing after toileting Toileting steps completed by helper: Adjust clothing prior to toileting, Performs perineal hygiene, Adjust clothing after toileting Toileting Assistive Devices: Grab bar or rail Assist level: Two helpers  Function - Air cabin crew transfer assistive device: Bedside commode Assist level to bedside commode (at bedside): 2 helpers Assist level from bedside commode (at bedside): Moderate assist (Pt 50 - 74%/lift or lower)        Function - Comprehension Comprehension: Auditory Comprehension assist level: Understands basic 90% of the time/cues < 10% of the  time  Function - Expression Expression: Verbal Expression assist level: Expresses basic 90% of the time/requires cueing < 10% of the time.  Function - Social Interaction Social Interaction assist level: Interacts appropriately with others with medication or extra time (anti-anxiety, antidepressant).  Function - Problem Solving Problem solving assist level: Solves basic 90% of the time/requires cueing < 10% of the time  Function - Memory Memory assist level: Recognizes or recalls 90% of the time/requires cueing < 10% of the time Patient normally able to recall (first 3 days only): Current season, That he or she is in a hospital  Medical Problem List and Plan: 1.  Right  facial weakness, Right sided weakness with balance deficits secondary to left internal capsule CVA with history of CVA. Initiate CIR PT OT 2.  DVT Prophylaxis/Anticoagulation: Pharmaceutical: Lovenox 3. Pain Management: will order tramadol and/or tylenol prn 4. Mood: LCSW to follow for evaluation and support.  5. Neuropsych: This patient is capable of making decisions on his own behalf. 6. Skin/Wound Care: routine pressure relief measures.  7. Fluids/Electrolytes/Nutrition: Maintain adequate nutrition/hydration. 8. Right knee pain:Appears to be in patellar area Post fall in hospital--X rays reported to be negative. Local measures. Start voltaren gel 9. HTN: Monitor BP bid--continue metoprolol and lisinopril. Permissive HTN to prevent hypoperfusion.  Will start more aggressive control after 1 week post stroke Vitals:   11/13/16 0248 11/13/16 0428  BP: (!) 187/88 (!) 175/88  Pulse: 89   Resp: 18   Temp: 97.9 F (36.6 C)   SpO2: 93%    10. CKD?: baseline SCr- 1.4. Recheck in am 11. New diagnosis T2DM: Monitor BS ac/hs. Will start low dose amaryl and titrate as indicated. Will use SSI for elevated BS and tighter control. Consult dietitian for dietary education.  CBG (last 3)  Recent Labs    11/12/16 1723  11/12/16 2111 11/13/16 0627  GLUCAP 139* 173* 131*  Controlled 11/10  12. Fluid overload/Chest pain: Has seen cardiology as an outpatient in the past.  Chest x-ray improved after Lasix.Ej fx 65% 13.  Shortness of breath uses albuterol at home presumed asthma versus COPD.    LOS (Days) 1 A FACE TO FACE EVALUATION WAS PERFORMED  KIRSTEINS,ANDREW E 11/13/2016, 7:38 AM

## 2016-11-13 NOTE — Plan of Care (Signed)
  RD consulted for nutrition education regarding new diabetes.  No results found for: HGBA1C  Only patient present in room. He was agreeable to education  Went through dietary recall Breakfast: Varies. He works at [employer] and typically gets something at work. Drinks Coffee w/ sweetener and a [employer] Drink Lunch: Again, because he works at [employer], he will typically get whatever he wants. Usually doesn't choose fries. Typically burger Dinner: Home cooked meal. States meal always has a vegetable and a protein. Denies eating corn or potatoes often.  Beverages: Diet sodas, (3-4/day) , SF energy Drinks, Coffee with sweetener  RD first specifically mentioned the good aspects of his diet, such as not skipping meals, avoiding potatoes, choosing diet beverages over the regular versions.   His breakfast sounds to be the area of his diet that could use most improvement. RD noted that the biscuits at [facility] are extremely high in Carbohydrates. RD recommended more appropriate items such as oatmeal, eggs, greek yogurt. He said he also has HTN. Therefore, breakfast meats would also be a poor choice.   For lunch, he says he can start eating the "low carb" burger they offer which apparently is just a burger wrapped in lettuce. This certainly sounds better from a carbohydrate standpoint, though RD noted he still would be likely getting excess sodium/fat depending on how their burgers are prepared.   He is drinking a large number of diet/artificially sweetened beverages and while these are certainly better than their regular counterparts, they are not ideal. RD recommended trying to reduce his intake to just 1-2 of these a day. Advocated for water, unsweetened tea/coffee, crystal light in water.   He says he already reads labels. RD reviewed a picture of a label and showed what he should look for. He should choose items high in protein/fiber and low in sugar/total carbs. Noted he should also account for  the portion size he eats. Estimated that he should consume 70-80 g/meal.   Gave patient handout titled "Diabetes Type 2 nutrition Therapy", "Diabetes Label Reading Tips" and wrote the following recommendations on his handouts.   Summary of Recommendations 1. Choose protein/fiber containing breakfast. Avoid hashbrowns, biscuits 2. Reduce diet sodas to 1-2/day.  3. Avoid high amounts of potatoes/corn 4. Read labels. Choose items high in protein/fiber and low in sugar/total carbs 5. Aim for 70-80 g carbs/meal  Expect Good compliance. Patient was agreeable to changes. His diet doesn't sound too poor to begin with  Body mass index is 34.01 kg/m. Pt meets criteria for Obese based on current BMI.  Labs and medications reviewed. No further nutrition interventions warranted at this time. If additional nutrition issues arise, please re-consult RD.  Christophe LouisNathan Franks RD, LDN, CNSC Clinical Nutrition Pager: [phone] 11/13/2016 11:55 AM

## 2016-11-14 ENCOUNTER — Inpatient Hospital Stay (HOSPITAL_COMMUNITY): Payer: BLUE CROSS/BLUE SHIELD

## 2016-11-14 DIAGNOSIS — M1000 Idiopathic gout, unspecified site: Secondary | ICD-10-CM

## 2016-11-14 DIAGNOSIS — M79609 Pain in unspecified limb: Secondary | ICD-10-CM

## 2016-11-14 LAB — GLUCOSE, CAPILLARY
GLUCOSE-CAPILLARY: 107 mg/dL — AB (ref 65–99)
Glucose-Capillary: 101 mg/dL — ABNORMAL HIGH (ref 65–99)
Glucose-Capillary: 122 mg/dL — ABNORMAL HIGH (ref 65–99)
Glucose-Capillary: 143 mg/dL — ABNORMAL HIGH (ref 65–99)

## 2016-11-14 LAB — URINE CULTURE

## 2016-11-14 MED ORDER — COLCHICINE 0.6 MG PO TABS
0.6000 mg | ORAL_TABLET | Freq: Two times a day (BID) | ORAL | Status: DC
  Administered 2016-11-14 (×2): 0.6 mg via ORAL
  Filled 2016-11-14 (×2): qty 1

## 2016-11-14 NOTE — Progress Notes (Signed)
Occupational Therapy Session Note  Patient Details  Name: [name] [name] MRN: [id number] Date of Birth: May 01, 1955  Today's Date: 11/14/2016 OT Individual Time: 1020-1100 OT Individual Time Calculation (min): 40 min    Short Term Goals: Week 1:  OT Short Term Goal 1 (Week 1): Pt will stand at sink with MIN A for grooming 2/2 items to improve endurance OT Short Term Goal 2 (Week 1): Pt will stand to wash buttocks during bathing with touching A OT Short Term Goal 3 (Week 1): Pt will complete tub transfer/shower transfer wiht touching A OT Short Term Goal 4 (Week 1): Pt will complete commode/toilet transfer with touching A  Skilled Therapeutic Interventions/Progress Updates:    1:1. Pt reprting pain in R knee/ankle. RN applies cream. Pt stand pivot transfer with steadying A recliner<>w/c with VC for hand placement and safety awareness as pt attempts to stand as OT is locking w/c brakes.Pt missed 20 min as pt off unit getting doppler. Upon return pt requesting to work on [place]. Pt completes med management activity with forced use of RUE to manipulate "pills" for improved [place], NMR and cognition. Pt able to notice 1 mistake with 1 question cue. Pt plays game of jenga building tower and selecting pieces with forced use of RUE for improved [place] and NMR. Exited session with pt seated in recliner with call light in reach and all needs met.   Therapy Documentation Precautions:  Precautions Precautions: Fall Precaution Comments: (R knee and ankle pain.  h/o R ankle gout.  R knee buckles.) Restrictions Weight Bearing Restrictions: No General:  See Function Navigator for Current Functional Status.   Therapy/Group: Individual Therapy  Tonny Branch 11/14/2016, 6:54 AM

## 2016-11-14 NOTE — Progress Notes (Signed)
Subjective/Complaints: No issues overnite except R ankle pain, hx gout on allopurinol Patient was given a prescription by his primary care for colchicine to be used during gout flareups.  He feels like he has a gout flareup at the current time  Review of systems negative for chest pain shortness of breath nausea vomiting constipation  Objective: Vital Signs: Blood pressure (!) 147/88, pulse 72, temperature 98.5 F (36.9 C), temperature source Oral, resp. rate 18, height '5\' 10"'  (1.778 m), weight 107.5 kg (237 lb), SpO2 94 %. Dg Chest Port 1 View  Result Date: 11/12/2016 CLINICAL DATA:  Short of breath EXAM: PORTABLE CHEST 1 VIEW COMPARISON:  CT and x-ray 11/11/2016 FINDINGS: Improvement in bilateral airspace disease. Bibasilar airspace disease remains. Probable clearing heart failure. No effusion. Heart size upper normal. IMPRESSION: Improving bilateral airspace disease compatible with improving heart failure. Electronically Signed   By: Franchot Gallo M.D.   On: 11/12/2016 20:26   Results for orders placed or performed during the hospital encounter of 11/12/16 (from the past 72 hour(s))  Glucose, capillary     Status: Abnormal   Collection Time: 11/12/16  5:23 PM  Result Value Ref Range   Glucose-Capillary 139 (H) 65 - 99 mg/dL  Glucose, capillary     Status: Abnormal   Collection Time: 11/12/16  9:11 PM  Result Value Ref Range   Glucose-Capillary 173 (H) 65 - 99 mg/dL  Urinalysis, Routine w reflex microscopic     Status: Abnormal   Collection Time: 11/12/16  9:28 PM  Result Value Ref Range   Color, Urine STRAW (A) YELLOW   APPearance CLEAR CLEAR   Specific Gravity, Urine 1.006 1.005 - 1.030   pH 5.0 5.0 - 8.0   Glucose, UA NEGATIVE NEGATIVE mg/dL   Hgb urine dipstick NEGATIVE NEGATIVE   Bilirubin Urine NEGATIVE NEGATIVE   Ketones, ur NEGATIVE NEGATIVE mg/dL   Protein, ur NEGATIVE NEGATIVE mg/dL   Nitrite NEGATIVE NEGATIVE   Leukocytes, UA NEGATIVE NEGATIVE  Comprehensive  metabolic panel     Status: Abnormal   Collection Time: 11/13/16  5:07 AM  Result Value Ref Range   Sodium 139 135 - 145 mmol/L   Potassium 3.4 (L) 3.5 - 5.1 mmol/L   Chloride 105 101 - 111 mmol/L   CO2 27 22 - 32 mmol/L   Glucose, Bld 136 (H) 65 - 99 mg/dL   BUN 24 (H) 6 - 20 mg/dL   Creatinine, Ser 1.38 (H) 0.61 - 1.24 mg/dL   Calcium 8.6 (L) 8.9 - 10.3 mg/dL   Total Protein 6.1 (L) 6.5 - 8.1 g/dL   Albumin 3.0 (L) 3.5 - 5.0 g/dL   AST 20 15 - 41 U/L   ALT 24 17 - 63 U/L   Alkaline Phosphatase 63 38 - 126 U/L   Total Bilirubin 2.0 (H) 0.3 - 1.2 mg/dL   GFR calc non Af Amer 54 (L) >60 mL/min   GFR calc Af Amer >60 >60 mL/min    Comment: (NOTE) The eGFR has been calculated using the CKD EPI equation. This calculation has not been validated in all clinical situations. eGFR's persistently <60 mL/min signify possible Chronic Kidney Disease.    Anion gap 7 5 - 15  CBC WITH DIFFERENTIAL     Status: Abnormal   Collection Time: 11/13/16  5:07 AM  Result Value Ref Range   WBC 11.1 (H) 4.0 - 10.5 K/uL   RBC 4.88 4.22 - 5.81 MIL/uL   Hemoglobin 14.4 13.0 - 17.0 g/dL  HCT 42.1 39.0 - 52.0 %   MCV 86.3 78.0 - 100.0 fL   MCH 29.5 26.0 - 34.0 pg   MCHC 34.2 30.0 - 36.0 g/dL   RDW 14.3 11.5 - 15.5 %   Platelets 178 150 - 400 K/uL   Neutrophils Relative % 74 %   Neutro Abs 8.3 (H) 1.7 - 7.7 K/uL   Lymphocytes Relative 15 %   Lymphs Abs 1.6 0.7 - 4.0 K/uL   Monocytes Relative 10 %   Monocytes Absolute 1.2 (H) 0.1 - 1.0 K/uL   Eosinophils Relative 1 %   Eosinophils Absolute 0.1 0.0 - 0.7 K/uL   Basophils Relative 0 %   Basophils Absolute 0.0 0.0 - 0.1 K/uL  Glucose, capillary     Status: Abnormal   Collection Time: 11/13/16  6:27 AM  Result Value Ref Range   Glucose-Capillary 131 (H) 65 - 99 mg/dL  Glucose, capillary     Status: None   Collection Time: 11/13/16 12:22 PM  Result Value Ref Range   Glucose-Capillary 96 65 - 99 mg/dL  Glucose, capillary     Status: Abnormal    Collection Time: 11/13/16  4:43 PM  Result Value Ref Range   Glucose-Capillary 111 (H) 65 - 99 mg/dL  Glucose, capillary     Status: Abnormal   Collection Time: 11/13/16  9:34 PM  Result Value Ref Range   Glucose-Capillary 120 (H) 65 - 99 mg/dL  Glucose, capillary     Status: Abnormal   Collection Time: 11/14/16  6:31 AM  Result Value Ref Range   Glucose-Capillary 101 (H) 65 - 99 mg/dL   Comment 1 Notify RN      HEENT: normal Cardio: RRR and no murmur Resp: CTA B/L and unlabored GI: BS positive and Non distended nontender Extremity:  No Edema Skin:   Wound abrasion healing R lateral knee Neuro: Alert/Oriented, Cranial Nerve Abnormalities Right CNVII, Abnormal Motor 3/5 right deltoid, bicep, tricep, finger flexors, 4 at the right hip flexor knee extensor 3- at the ankle plantar flexor 2- at the ankle dorsiflexor. and Dysarthric Musc/Skel:  Other Minimal pain with knee endrange flexion.  Has full range of motion.  No evidence of knee effusion.  No erythema.  There is mild tenderness over the patella, no significant tenderness over the quad tendon or the patellar tendon. General no acute distress   Assessment/Plan: 1. Functional deficits secondary to right hemiparesis secondary to left IC posterior limb infarct which require 3+ hours per day of interdisciplinary therapy in a comprehensive inpatient rehab setting. Physiatrist is providing close team supervision and 24 hour management of active medical problems listed below. Physiatrist and rehab team continue to assess barriers to discharge/monitor patient progress toward functional and medical goals. FIM: Function - Bathing Position: Shower Body parts bathed by patient: Right arm, Right lower leg, Left lower leg, Left arm, Chest, Abdomen, Front perineal area, Buttocks, Right upper leg, Left upper leg Body parts bathed by helper: Back Assist Level: Supervision or verbal cues  Function- Upper Body Dressing/Undressing Assist Level:  Supervision or verbal cues Function - Lower Body Dressing/Undressing Position: Wheelchair/chair at sink Assist for footwear: Partial/moderate assist Assist for lower body dressing: Touching or steadying assistance (Pt > 75%)  Function - Toileting Toileting steps completed by patient: Adjust clothing prior to toileting, Performs perineal hygiene Toileting steps completed by helper: Adjust clothing after toileting Toileting Assistive Devices: Grab bar or rail Assist level: Touching or steadying assistance (Pt.75%)  Function - Air cabin crew transfer  assistive device: Grab bar Assist level to toilet: Moderate assist (Pt 50 - 74%/lift or lower) Assist level from toilet: Moderate assist (Pt 50 - 74%/lift or lower) Assist level to bedside commode (at bedside): 2 helpers Assist level from bedside commode (at bedside): Moderate assist (Pt 50 - 74%/lift or lower)  Function - Chair/bed transfer Chair/bed transfer method: Squat pivot Chair/bed transfer assist level: Moderate assist (Pt 50 - 74%/lift or lower) Chair/bed transfer assistive device: Bedrails, Armrests Chair/bed transfer details: Verbal cues for technique, Verbal cues for precautions/safety, Manual facilitation for weight shifting  Function - Locomotion: Wheelchair Will patient use wheelchair at discharge?: No Max wheelchair distance: (75 ft) Assist Level: Touching or steadying assistance (Pt > 75%) Assist Level: Touching or steadying assistance (Pt > 75%) Wheel 150 feet activity did not occur: Safety/medical concerns(Fatigue) Turns around,maneuvers to table,bed, and toilet,negotiates 3% grade,maneuvers on rugs and over doorsills: No Function - Locomotion: Ambulation Assistive device: Walker-rolling Max distance: 40 Assist level: Touching or steadying assistance (Pt > 75%) Assist level: Touching or steadying assistance (Pt > 75%) Walk 50 feet with 2 turns activity did not occur: Safety/medical concerns(R ankle pain/R  knee weakness) Walk 150 feet activity did not occur: Safety/medical concerns Walk 10 feet on uneven surfaces activity did not occur: Safety/medical concerns  Function - Comprehension Comprehension: Auditory Comprehension assist level: Follows basic conversation/direction with no assist  Function - Expression Expression: Verbal Expression assist level: Expresses basic 90% of the time/requires cueing < 10% of the time.  Function - Social Interaction Social Interaction assist level: Interacts appropriately with others with medication or extra time (anti-anxiety, antidepressant).  Function - Problem Solving Problem solving assist level: Solves basic problems with no assist  Function - Memory Memory assist level: Recognizes or recalls 75 - 89% of the time/requires cueing 10 - 24% of the time Patient normally able to recall (first 3 days only): Current season, That he or she is in a hospital  Medical Problem List and Plan: 1.  Right  facial weakness, Right sided weakness with balance deficits secondary to left internal capsule CVA with history of CVA. Continue CIR PT OT 2.  DVT Prophylaxis/Anticoagulation: Pharmaceutical: Lovenox 3. Pain Management: will order tramadol and/or tylenol prn 4. Mood: LCSW to follow for evaluation and support.  5. Neuropsych: This patient is capable of making decisions on his own behalf. 6. Skin/Wound Care: routine pressure relief measures.  7. Fluids/Electrolytes/Nutrition: Maintain adequate nutrition/hydration. 8. Right knee pain:Appears to be in patellar area Post fall in hospital--X rays reported to be negative. Local measures. Start voltaren gel 9. HTN: Monitor BP bid--continue metoprolol and lisinopril. Permissive HTN to prevent hypoperfusion.  Will start more aggressive control after 1 week post stroke Vitals:   11/13/16 1500 11/14/16 0353  BP: (!) 152/85 (!) 147/88  Pulse: 82 72  Resp: 18 18  Temp: 98.9 F (37.2 C) 98.5 F (36.9 C)  SpO2: 97%  94%  Reasonable control 11/14/2016 10. CKD?: baseline SCr- 1.4. Recheck in am 11. New diagnosis T2DM: Monitor BS ac/hs. Will start low dose amaryl and titrate as indicated. Will use SSI for elevated BS and tighter control. Consult dietitian for dietary education.  CBG (last 3)  Recent Labs    11/13/16 1643 11/13/16 2134 11/14/16 0631  GLUCAP 111* 120* 101*  Controlled 11/10  12. Fluid overload/Chest pain: Has seen cardiology as an outpatient in the past.  Chest x-ray improved after Lasix.Ej fx 65% 13.  Shortness of breath uses albuterol at home presumed asthma versus COPD.  14.  Gout flareup, stop allopurinol and start colchicine.  Will start with twice daily may need to increase.   LOS (Days) 2 A FACE TO FACE EVALUATION WAS PERFORMED  [name] [name] 11/14/2016, 7:45 AM

## 2016-11-14 NOTE — Progress Notes (Signed)
*  PRELIMINARY RESULTS* Vascular Ultrasound Bilateral lower extremity venous duplex has been completed.  Preliminary findings: No evidence of deep vein thrombosis or baker's cysts bilaterally.   Chauncey FischerCharlotte C Kenyan Karnes 11/14/2016, 9:43 AM

## 2016-11-15 ENCOUNTER — Ambulatory Visit: Payer: BLUE CROSS/BLUE SHIELD | Admitting: Cardiology

## 2016-11-15 ENCOUNTER — Inpatient Hospital Stay (HOSPITAL_COMMUNITY): Payer: BLUE CROSS/BLUE SHIELD | Admitting: Occupational Therapy

## 2016-11-15 ENCOUNTER — Inpatient Hospital Stay (HOSPITAL_COMMUNITY): Payer: BLUE CROSS/BLUE SHIELD | Admitting: Physical Therapy

## 2016-11-15 ENCOUNTER — Inpatient Hospital Stay (HOSPITAL_COMMUNITY): Payer: BLUE CROSS/BLUE SHIELD | Admitting: Speech Pathology

## 2016-11-15 LAB — GLUCOSE, CAPILLARY
GLUCOSE-CAPILLARY: 98 mg/dL (ref 65–99)
GLUCOSE-CAPILLARY: 81 mg/dL (ref 65–99)
Glucose-Capillary: 122 mg/dL — ABNORMAL HIGH (ref 65–99)
Glucose-Capillary: 130 mg/dL — ABNORMAL HIGH (ref 65–99)

## 2016-11-15 MED ORDER — COLCHICINE 0.6 MG PO TABS
0.6000 mg | ORAL_TABLET | Freq: Three times a day (TID) | ORAL | Status: DC
  Administered 2016-11-15: 0.6 mg via ORAL
  Filled 2016-11-15 (×2): qty 1

## 2016-11-15 MED ORDER — COLCHICINE 0.6 MG PO TABS
1.2000 mg | ORAL_TABLET | Freq: Once | ORAL | Status: AC
  Administered 2016-11-15: 1.2 mg via ORAL
  Filled 2016-11-15: qty 2

## 2016-11-15 NOTE — Plan of Care (Signed)
  Progressing RH BLADDER ELIMINATION RH STG MANAGE BLADDER WITH ASSISTANCE Description STG Manage Bladder With min Assistance  11/15/2016 1643 - Progressing by Alfonso RamusEvans, Harry Bark S, RN RH SKIN INTEGRITY RH STG SKIN FREE OF INFECTION/BREAKDOWN 11/15/2016 1643 - Progressing by Alfonso RamusEvans, Donie Lemelin S, RN RH STG MAINTAIN SKIN INTEGRITY WITH ASSISTANCE Description STG Maintain Skin Integrity With min Assistance.  11/15/2016 1643 - Progressing by Alfonso RamusEvans, Adelai Achey S, RN RH SAFETY RH STG ADHERE TO SAFETY PRECAUTIONS W/ASSISTANCE/DEVICE Description STG Adhere to Safety Precautions With min Assistance/Device.  11/15/2016 1643 - Progressing by Alfonso RamusEvans, Laiden Milles S, RN RH STG DECREASED RISK OF FALL WITH ASSISTANCE Description STG Decreased Risk of Fall With Assistance. 11/15/2016 1643 - Progressing by Alfonso RamusEvans, Dhyan Noah S, RN RH COGNITION-NURSING RH STG USES MEMORY AIDS/STRATEGIES W/ASSIST TO PROBLEM SOLVE Description STG Uses Memory Aids/Strategies With min Assistance to Problem Solve.  11/15/2016 1643 - Progressing by Alfonso RamusEvans, Audreyanna Butkiewicz S, RN RH PAIN MANAGEMENT RH STG PAIN MANAGED AT OR BELOW PT'S PAIN GOAL Description <3 (scale of 0-10)  11/15/2016 1643 - Progressing by Alfonso RamusEvans, Adekunle Rohrbach S, RN RH KNOWLEDGE DEFICIT RH STG INCREASE KNOWLEDGE OF DIABETES Description Pt will demonstrate increased knowledge of DM at discharge with Mod I assist using cues/printed resources  11/15/2016 1643 - Progressing by Alfonso RamusEvans, Jovani Colquhoun S, RN RH STG INCREASE KNOWLEDGE OF HYPERTENSION Description Pt will demonstrate increased knowledge of DM at discharge with Mod I assist using cues/printed resources  11/15/2016 1643 - Progressing by Alfonso RamusEvans, Kori Colin S, RN

## 2016-11-15 NOTE — Progress Notes (Signed)
Subjective/Complaints:   Review of systems negative for chest pain shortness of breath nausea vomiting constipation  Objective: Vital Signs: Blood pressure (!) 162/91, pulse 69, temperature 97.9 F (36.6 C), temperature source Oral, resp. rate 18, height '5\' 10"'  (1.778 m), weight 107.5 kg (237 lb), SpO2 100 %. No results found. Results for orders placed or performed during the hospital encounter of 11/12/16 (from the past 72 hour(s))  Glucose, capillary     Status: Abnormal   Collection Time: 11/12/16  5:23 PM  Result Value Ref Range   Glucose-Capillary 139 (H) 65 - 99 mg/dL  Glucose, capillary     Status: Abnormal   Collection Time: 11/12/16  9:11 PM  Result Value Ref Range   Glucose-Capillary 173 (H) 65 - 99 mg/dL  Culture, Urine     Status: Abnormal   Collection Time: 11/12/16  9:25 PM  Result Value Ref Range   Specimen Description URINE, CLEAN CATCH    Special Requests NONE    Culture MULTIPLE SPECIES PRESENT, SUGGEST RECOLLECTION (A)    Report Status 11/14/2016 FINAL   Urinalysis, Routine w reflex microscopic     Status: Abnormal   Collection Time: 11/12/16  9:28 PM  Result Value Ref Range   Color, Urine STRAW (A) YELLOW   APPearance CLEAR CLEAR   Specific Gravity, Urine 1.006 1.005 - 1.030   pH 5.0 5.0 - 8.0   Glucose, UA NEGATIVE NEGATIVE mg/dL   Hgb urine dipstick NEGATIVE NEGATIVE   Bilirubin Urine NEGATIVE NEGATIVE   Ketones, ur NEGATIVE NEGATIVE mg/dL   Protein, ur NEGATIVE NEGATIVE mg/dL   Nitrite NEGATIVE NEGATIVE   Leukocytes, UA NEGATIVE NEGATIVE  Comprehensive metabolic panel     Status: Abnormal   Collection Time: 11/13/16  5:07 AM  Result Value Ref Range   Sodium 139 135 - 145 mmol/L   Potassium 3.4 (L) 3.5 - 5.1 mmol/L   Chloride 105 101 - 111 mmol/L   CO2 27 22 - 32 mmol/L   Glucose, Bld 136 (H) 65 - 99 mg/dL   BUN 24 (H) 6 - 20 mg/dL   Creatinine, Ser 1.38 (H) 0.61 - 1.24 mg/dL   Calcium 8.6 (L) 8.9 - 10.3 mg/dL   Total Protein 6.1 (L) 6.5 -  8.1 g/dL   Albumin 3.0 (L) 3.5 - 5.0 g/dL   AST 20 15 - 41 U/L   ALT 24 17 - 63 U/L   Alkaline Phosphatase 63 38 - 126 U/L   Total Bilirubin 2.0 (H) 0.3 - 1.2 mg/dL   GFR calc non Af Amer 54 (L) >60 mL/min   GFR calc Af Amer >60 >60 mL/min    Comment: (NOTE) The eGFR has been calculated using the CKD EPI equation. This calculation has not been validated in all clinical situations. eGFR's persistently <60 mL/min signify possible Chronic Kidney Disease.    Anion gap 7 5 - 15  CBC WITH DIFFERENTIAL     Status: Abnormal   Collection Time: 11/13/16  5:07 AM  Result Value Ref Range   WBC 11.1 (H) 4.0 - 10.5 K/uL   RBC 4.88 4.22 - 5.81 MIL/uL   Hemoglobin 14.4 13.0 - 17.0 g/dL   HCT 42.1 39.0 - 52.0 %   MCV 86.3 78.0 - 100.0 fL   MCH 29.5 26.0 - 34.0 pg   MCHC 34.2 30.0 - 36.0 g/dL   RDW 14.3 11.5 - 15.5 %   Platelets 178 150 - 400 K/uL   Neutrophils Relative % 74 %  Neutro Abs 8.3 (H) 1.7 - 7.7 K/uL   Lymphocytes Relative 15 %   Lymphs Abs 1.6 0.7 - 4.0 K/uL   Monocytes Relative 10 %   Monocytes Absolute 1.2 (H) 0.1 - 1.0 K/uL   Eosinophils Relative 1 %   Eosinophils Absolute 0.1 0.0 - 0.7 K/uL   Basophils Relative 0 %   Basophils Absolute 0.0 0.0 - 0.1 K/uL  Glucose, capillary     Status: Abnormal   Collection Time: 11/13/16  6:27 AM  Result Value Ref Range   Glucose-Capillary 131 (H) 65 - 99 mg/dL  Glucose, capillary     Status: None   Collection Time: 11/13/16 12:22 PM  Result Value Ref Range   Glucose-Capillary 96 65 - 99 mg/dL  Glucose, capillary     Status: Abnormal   Collection Time: 11/13/16  4:43 PM  Result Value Ref Range   Glucose-Capillary 111 (H) 65 - 99 mg/dL  Glucose, capillary     Status: Abnormal   Collection Time: 11/13/16  9:34 PM  Result Value Ref Range   Glucose-Capillary 120 (H) 65 - 99 mg/dL  Glucose, capillary     Status: Abnormal   Collection Time: 11/14/16  6:31 AM  Result Value Ref Range   Glucose-Capillary 101 (H) 65 - 99 mg/dL   Comment  1 Notify RN   Glucose, capillary     Status: Abnormal   Collection Time: 11/14/16 11:33 AM  Result Value Ref Range   Glucose-Capillary 107 (H) 65 - 99 mg/dL  Glucose, capillary     Status: Abnormal   Collection Time: 11/14/16  4:24 PM  Result Value Ref Range   Glucose-Capillary 122 (H) 65 - 99 mg/dL  Glucose, capillary     Status: Abnormal   Collection Time: 11/14/16  9:16 PM  Result Value Ref Range   Glucose-Capillary 143 (H) 65 - 99 mg/dL  Glucose, capillary     Status: None   Collection Time: 11/15/16  6:22 AM  Result Value Ref Range   Glucose-Capillary 98 65 - 99 mg/dL     HEENT: normal Cardio: RRR and no murmur Resp: CTA B/L and unlabored GI: BS positive and Non distended nontender Extremity:  No Edema Skin:   Wound abrasion healing R lateral knee Neuro: Alert/Oriented, Cranial Nerve Abnormalities Right CNVII, Abnormal Motor 3/5 right deltoid, bicep, tricep, finger flexors, 4 at the right hip flexor knee extensor 3- at the ankle plantar flexor 2- at the ankle dorsiflexor. and Dysarthric Musc/Skel:  Other Minimal pain with knee endrange flexion.  Has full range of motion.  No evidence of knee effusion.  No erythema.  There is mild tenderness over the patella, no significant tenderness over the quad tendon or the patellar tendon. General no acute distress   Assessment/Plan: 1. Functional deficits secondary to right hemiparesis secondary to left IC posterior limb infarct which require 3+ hours per day of interdisciplinary therapy in a comprehensive inpatient rehab setting. Physiatrist is providing close team supervision and 24 hour management of active medical problems listed below. Physiatrist and rehab team continue to assess barriers to discharge/monitor patient progress toward functional and medical goals. FIM: Function - Bathing Position: Shower Body parts bathed by patient: Right arm, Right lower leg, Left lower leg, Left arm, Chest, Abdomen, Front perineal area,  Buttocks, Right upper leg, Left upper leg Body parts bathed by helper: Back Assist Level: Supervision or verbal cues  Function- Upper Body Dressing/Undressing Assist Level: Supervision or verbal cues Function - Lower Body Dressing/Undressing Position:  Wheelchair/chair at sink Assist for footwear: Partial/moderate assist Assist for lower body dressing: Touching or steadying assistance (Pt > 75%)  Function - Toileting Toileting steps completed by patient: Adjust clothing prior to toileting, Performs perineal hygiene, Adjust clothing after toileting Toileting steps completed by helper: Adjust clothing prior to toileting, Performs perineal hygiene, Adjust clothing after toileting Toileting Assistive Devices: Grab bar or rail Assist level: Touching or steadying assistance (Pt.75%)  Function - Air cabin crew transfer activity did not occur: Refused Toilet transfer assistive device: Grab bar Assist level to toilet: Moderate assist (Pt 50 - 74%/lift or lower) Assist level from toilet: Moderate assist (Pt 50 - 74%/lift or lower) Assist level to bedside commode (at bedside): 2 helpers Assist level from bedside commode (at bedside): Moderate assist (Pt 50 - 74%/lift or lower)  Function - Chair/bed transfer Chair/bed transfer method: Squat pivot Chair/bed transfer assist level: Moderate assist (Pt 50 - 74%/lift or lower) Chair/bed transfer assistive device: Bedrails, Armrests Chair/bed transfer details: Verbal cues for technique, Verbal cues for precautions/safety, Manual facilitation for weight shifting  Function - Locomotion: Wheelchair Will patient use wheelchair at discharge?: No Max wheelchair distance: (75 ft) Assist Level: Touching or steadying assistance (Pt > 75%) Assist Level: Touching or steadying assistance (Pt > 75%) Wheel 150 feet activity did not occur: Safety/medical concerns(Fatigue) Turns around,maneuvers to table,bed, and toilet,negotiates 3% grade,maneuvers on  rugs and over doorsills: No Function - Locomotion: Ambulation Assistive device: Walker-rolling Max distance: 40 Assist level: Touching or steadying assistance (Pt > 75%) Assist level: Touching or steadying assistance (Pt > 75%) Walk 50 feet with 2 turns activity did not occur: Safety/medical concerns(R ankle pain/R knee weakness) Walk 150 feet activity did not occur: Safety/medical concerns Walk 10 feet on uneven surfaces activity did not occur: Safety/medical concerns  Function - Comprehension Comprehension: Auditory Comprehension assist level: Follows basic conversation/direction with no assist  Function - Expression Expression: Verbal Expression assist level: Expresses basic 90% of the time/requires cueing < 10% of the time.  Function - Social Interaction Social Interaction assist level: Interacts appropriately with others with medication or extra time (anti-anxiety, antidepressant).  Function - Problem Solving Problem solving assist level: Solves basic problems with no assist  Function - Memory Memory assist level: Recognizes or recalls 75 - 89% of the time/requires cueing 10 - 24% of the time Patient normally able to recall (first 3 days only): Current season, That he or she is in a hospital  Medical Problem List and Plan: 1.  Right  facial weakness, Right sided weakness with balance deficits secondary to left internal capsule CVA with history of CVA. Continue CIR PT OT 2.  DVT Prophylaxis/Anticoagulation: Pharmaceutical: Lovenox 3. Pain Management: will order tramadol and/or tylenol prn 4. Mood: LCSW to follow for evaluation and support.  5. Neuropsych: This patient is capable of making decisions on his own behalf. 6. Skin/Wound Care: routine pressure relief measures.  7. Fluids/Electrolytes/Nutrition: Maintain adequate nutrition/hydration. 8. Right knee pain:Appears to be in patellar area Post fall in hospital--X rays reported to be negative. Local measures. Start voltaren  gel 9. HTN: Monitor BP bid--continue metoprolol and lisinopril. Permissive HTN to prevent hypoperfusion.  Will start more aggressive control after 1 week post stroke Vitals:   11/14/16 1519 11/15/16 0527  BP: (!) 158/95 (!) 162/91  Pulse: 70 69  Resp:  18  Temp:  97.9 F (36.6 C)  SpO2:  100%  elevated will increase lisinopril in am if no improvement  10. CKD?: baseline SCr- 1.4. Stable at 1.38, monitor on colchcine 11. New diagnosis T2DM: Monitor BS ac/hs. Will start low dose amaryl and titrate as indicated. Will use SSI for elevated BS and tighter control. Consult dietitian for dietary education.  CBG (last 3)  Recent Labs    11/14/16 1624 11/14/16 2116 11/15/16 0622  GLUCAP 122* 143* 98  Controlled 11/12  12. Fluid overload/Chest pain: Has seen cardiology as an outpatient in the past.  Chest x-ray improved after Lasix.Ej fx 65% 13.  Shortness of breath uses albuterol at home presumed asthma versus COPD. 14.  Gout flareup, stop allopurinol and start colchicine.no improvement with BID dose Will load with 1.78m and increase to TID dose, monitor for diarrhea.   LOS (Days) 3 A FACE TO FACE EVALUATION WAS PERFORMED  KIRSTEINS,ANDREW E 11/15/2016, 7:16 AM

## 2016-11-15 NOTE — Care Management Note (Signed)
[facility] Individual Statement of Services  Patient Name:  [name] [name]  Date:  11/15/2016  Welcome to the [facility].  Our goal is to provide you with an individualized program based on your diagnosis and situation, designed to meet your specific needs.  With this comprehensive rehabilitation program, you will be expected to participate in at least 3 hours of rehabilitation therapies Monday-Friday, with modified therapy programming on the weekends.  Your rehabilitation program will include the following services:  Physical Therapy (PT), Occupational Therapy (OT), Speech Therapy (ST), 24 hour per day rehabilitation nursing, Therapeutic Recreaction (TR), Neuropsychology, Case Management (Social Worker), Rehabilitation Medicine, Nutrition Services and Pharmacy Services  Weekly team conferences will be held on Wednesdays to discuss your progress.  Your Social Worker will talk with you frequently to get your input and to update you on team discussions.  Team conferences with you and your family in attendance may also be held.  Expected length of stay: 10-14 days  Overall anticipated outcome: modified independent  Depending on your progress and recovery, your program may change. Your Social Worker will coordinate services and will keep you informed of any changes. Your Social Worker's name and contact numbers are listed  below.  The following services may also be recommended but are not provided by the [facility]:   Driving Evaluations  Home Health Rehabiltiation Services  Outpatient Rehabilitation Services  Vocational Rehabilitation   Arrangements will be made to provide these services after discharge if needed.  Arrangements include referral to agencies that provide these services.  Your insurance has been verified to be:  [employer] Your primary doctor is:  Jeanie SewerRedding  Pertinent information will be shared with your doctor  and your insurance company.  Social Worker:  Dossie DerBecky Dupree, SW [phone] or [phone]) [phone]  Information discussed with and copy given to patient by: Amada JupiterHOYLE, Ayame Rena, 11/15/2016, 2:03 PM

## 2016-11-15 NOTE — Progress Notes (Signed)
Occupational Therapy Session Note  Patient Details  Name: [name] [name] MRN: [id number] Date of Birth: 1955/10/30  Today's Date: 11/15/2016 OT Individual Time: 1132-1200 OT Individual Time Calculation (min): 28 min    Short Term Goals: Week 1:  OT Short Term Goal 1 (Week 1): Pt will stand at sink with MIN A for grooming 2/2 items to improve endurance OT Short Term Goal 2 (Week 1): Pt will stand to wash buttocks during bathing with touching A OT Short Term Goal 3 (Week 1): Pt will complete tub transfer/shower transfer wiht touching A OT Short Term Goal 4 (Week 1): Pt will complete commode/toilet transfer with touching A  Skilled Therapeutic Interventions/Progress Updates:    Treatment session with focus on RUE NMR and standing balance.  Pt completed stand pivot transfer recliner > w/c with use of RW and min guard.  Engaged in w/c propulsion with min cues for attention to RUE during propulsion to increase straight trajectory during mobility as well as obstacles on Rt.  Engaged in pipe tree activity in standing with focus on weight shift to Rt to obtain pieces to replicate picture.  Utilized RUE with mild increased time due to impaired strength and gross motor control.  Cues for controlled descent when returning to sitting.  Returned to room as above and completed transfer back to recliner with min guard.  All needs in reach and chair alarm on.  Therapy Documentation Precautions:  Precautions Precautions: Fall Precaution Comments: (R knee and ankle pain.  h/o R ankle gout.  R knee buckles.) Restrictions Weight Bearing Restrictions: No Pain:  Pt with no c/o pain  See Function Navigator for Current Functional Status.   Therapy/Group: Individual Therapy  Rosalio LoudHOXIE, Stpehanie Montroy 11/15/2016, 12:07 PM

## 2016-11-15 NOTE — Progress Notes (Signed)
Physical Therapy Session Note  Patient Details  Name: [name] [name] MRN: [id number] Date of Birth: 07/19/1955  Today's Date: 11/15/2016 PT Individual Time: 1000-1100 PT Individual Time Calculation (min): 60 min   Short Term Goals: Week 1:  PT Short Term Goal 1 (Week 1): Pt will ambulate 75 ft with LRAD and supervision PT Short Term Goal 2 (Week 1): Pt will transfer bed to chair with supervision and LRAD PT Short Term Goal 3 (Week 1): Pt will perform sit to stand wtih LRAD wtih supervision and without LOB for 5/5 reps  Skilled Therapeutic Interventions/Progress Updates:    no c/o pain.  Session focus on transfers, gait, balance, and NMR.   Pt transfers sit<>stand and stand/pivot throughout session with close supervision.  Gait training x93' with RW and min guard, verbal cues for upright posture, forward gaze, and walker positioning.  Attempted NMR for weight shifting with alternating toe taps but pt reporting pain from gout in RLE with SLS.  Standing card matching activity with RUE focus on card manipulation, visual scanning, and balance.  Transition to seated RUE graded clothespin task from large>small clothes pins for NMR.  Sit<>supine with supervision and pt completed 2x10 reps bridges and SAQ for NMR and strengthening.  Pt returned to room at end of session and positioned in recliner with chair alarm activated, call bell in reach and needs met.   Therapy Documentation Precautions:  Precautions Precautions: Fall Precaution Comments: (R knee and ankle pain.  h/o R ankle gout.  R knee buckles.) Restrictions Weight Bearing Restrictions: No   See Function Navigator for Current Functional Status.   Therapy/Group: Individual Therapy  Caitlin E Warren 11/15/2016, 11:04 AM  

## 2016-11-15 NOTE — Progress Notes (Signed)
Speech Language Pathology Daily Session Note  Patient Details  Name: [name] [name] MRN: [id number] Date of Birth: [birth year]  Today's Date: 11/15/2016 SLP Individual Time: 0830-0930 SLP Individual Time Calculation (min): 60 min  Short Term Goals: Week 1: SLP Short Term Goal 1 (Week 1): Pt will demonstrate trials of regular textured foods with efficient mastication without overt s/s aspiration to demonstrate readiness for diet advancement. SLP Short Term Goal 2 (Week 1): Pt will utilize speech intelligiblity strategies in conversation with supervision A verbal and question cues to achieve 90% intelligiblity.  SLP Short Term Goal 3 (Week 1): Pt will demonstrate word finding strategies in semi-complex functional tasks with supervision A verbal and question cues.  SLP Short Term Goal 4 (Week 1): Pt will use external memory aid to recall new semi-complex information with supervision A verbal and question cues.  SLP Short Term Goal 5 (Week 1): Pt will demonstrate functional probelm solving skills in semi-complex tasks at Mod I level.  SLP Short Term Goal 6 (Week 1): Pt will demonstrate anticipatorey awareness and identify 3 tasks he can particpate in at home safely with supervision A verbal and question cues.  Skilled Therapeutic Interventions: Skilled treatment session focused on cognition goals.  SLP facilitated session by providing supervision cues for completion of complex medication management task. Pt able to list 2 activities that would be safe within the home environment with supervision cues. Pt was returned to room, left upright in recliner, chair alarm on and all needs within reach. Continue per current plan of care.      Function:    Cognition Comprehension Comprehension assist level: Follows basic conversation/direction with no assist  Expression   Expression assist level: Expresses basic needs/ideas: With no assist  Social Interaction Social Interaction assist level: Interacts  appropriately with others with medication or extra time (anti-anxiety, antidepressant).  Problem Solving Problem solving assist level: Solves basic problems with no assist  Memory Memory assist level: Recognizes or recalls 75 - 89% of the time/requires cueing 10 - 24% of the time    Pain    Therapy/Group: Individual Therapy  [name] 11/15/2016, 11:22 AM

## 2016-11-15 NOTE — Progress Notes (Signed)
Social Work  Social Work Assessment and Plan  Patient Details  Name: [name] CurlingJon M Lesko MRN: [id number] Date of Birth: Apr 05, 1955  Today's Date: 11/15/2016  Problem List:  Patient Active Problem List   Diagnosis Date Noted  . Stroke due to embolism of left middle cerebral artery (HCC) 11/12/2016  . Shortness of breath   . SOB (shortness of breath)   . Left hemiparesis (HCC)   . Acute pain of right knee   . Benign essential HTN   . Stage 3 chronic kidney disease (HCC)   . Newly diagnosed diabetes (HCC)   . Chest pain   . Atypical chest pain 10/14/2016  . Abnormal EKG 09/10/2016  . Sleep disorder 09/10/2016  . Benign essential hypertension 09/10/2016  . Elevated cholesterol 09/10/2016   Past Medical History:  Past Medical History:  Diagnosis Date  . CVA (cerebral vascular accident) (HCC) 2014  . Hypertension   . Obesity (BMI 30.0-34.9)   . Sleep disorder    Past Surgical History:  Past Surgical History:  Procedure Laterality Date  . Arthroscopic knee surgery    . TONSILLECTOMY     Social History:  reports that  has never smoked. he has never used smokeless tobacco. He reports that he drinks alcohol. He reports that he does not use drugs.  Family / Support Systems Marital Status: Single(has long-term girlfriend) Patient Roles: Partner, Parent Spouse/Significant Other: girlfriend, Marylene Landngela @ [phone](C) [phone] Children: pt has a daughter, Denny Peonrin ([phone]) living in [place] and a son, Arlys JohnBrian ([id number]) living in [place], [place];  Marylene Landngela has adult children in the area; Pt and gf also have a 386 yr old foster son in their home. Other Supports: Pt's parents living in [place] Anticipated Caregiver: Marylene Landngela Ability/Limitations of Caregiver: works Economistfulltime Caregiver Availability: Intermittent(Angela may be able to take a short time off from work) Family Dynamics: Pt describes very good relationship with his children as well as Angela's.  No concerns.  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 Legal Hisotry/Current Legal Issues: None Guardian/Conservator: None - per MD, pt is capable of making decisions on his own behalf.   Abuse/Neglect Abuse/Neglect Assessment Can Be Completed: Yes Physical Abuse: Denies Verbal Abuse: Denies Sexual Abuse: Denies Exploitation of patient/patient's resources: Denies Self-Neglect: Denies  Emotional Status Pt's affect, behavior adn adjustment status: Pt very pleasant and completes assessment interview without difficulty.  He is hopeful about his recovery but admits that this, his 3rd stroke, is "the worst".  He denies any emotional distress, however, will monitor and refer for neuropsychology consult if indicated. Recent Psychosocial Issues: None Pyschiatric History: None Substance Abuse History: None  Patient / Family Perceptions, Expectations & Goals Pt/Family understanding of illness & functional limitations: Pt and family with good understanding of his CVA, resulting functional limitations and need for CIR. Premorbid pt/family roles/activities: Pt was working f/t and completely independent.  He and Marylene Landngela were both working and caring for their 466 yr old foster son. Anticipated changes in roles/activities/participation: limited change anticipated as goals have been set for modified independent Pt/family expectations/goals: "I need to get back to doing for myself."  [employer] Community Agencies: None Premorbid Home Care/DME Agencies: None Transportation available at discharge: yes Resource referrals recommended: Neuropsychology  Discharge Planning Living Arrangements: Spouse/significant other, Children  Support Systems: Spouse/significant other, Children, Parent,  Other relatives, Friends/neighbors Type of Residence: Private residence Insurance Resources: Media plannerrivate Insurance (specify)([employer]BS) Financial Resources: Employment Financial Screen Referred: No Living Expenses: Database administrator[employer]gage Money Management: Patient, Significant Other Does the patient have any problems obtaining your medications?: No Home Management: pt and girlfriend Patient/Family Preliminary Plans: Pt to return home with girlfriend able to take some time off from work if needed. Expected length of stay: ELOS 10- 14 days  Clinical Impression Very pleasant gentleman here following a CVA (his 3rd).  Motivated for CIR and hopes to return to work when medically ready to do so.  He denies any significant emotional distress but will monitor.  He admits frustrations with his limitations.  Good support from his long-term girlfriend and they adult children as well as extended family.  SW to follow for support and d/c planning needs.  Simrin Vegh 11/15/2016, 3:35 PM

## 2016-11-15 NOTE — IPOC Note (Signed)
Overall Plan of Care [facility](IPOC) Patient Details Name: [name] [name] MRN: [id number] DOB: 09/19/1955  Admitting Diagnosis: <principal problem not specified>  Hospital Problems: Active Problems:   Stroke due to embolism of left middle cerebral artery (HCC)   Shortness of breath   SOB (shortness of breath)   Left hemiparesis (HCC)   Acute pain of right knee   Benign essential HTN   Stage 3 chronic kidney disease (HCC)   Newly diagnosed diabetes (HCC)   Chest pain     Functional Problem List: Nursing Behavior, Endurance, Medication Management, Edema, Motor, Nutrition, Pain  PT Balance, Endurance, Motor, Pain, Perception, Safety  OT Balance, Cognition, Endurance, Motor, Pain, Safety  SLP    TR         Basic ADL's: OT Eating, Grooming, Bathing, Dressing, Toileting     Advanced  ADL's: OT       Transfers: PT Bed Mobility, Bed to Chair, Car, Occupational psychologisturniture  OT Toilet, Research scientist (life sciences)Tub/Shower     Locomotion: PT Ambulation, Stairs     Additional Impairments: OT    SLP Swallowing, Social Cognition   Memory, Problem Solving  TR      Anticipated Outcomes Item Anticipated Outcome  Self Feeding MOD I  Swallowing  Mod I   Basic self-care  MOD I  Toileting   MOD I toilet S shower   Bathroom Transfers MOD I toilet S shower  Bowel/Bladder  min assist   Transfers  Mod I  Locomotion  Mod I  Communication  Mod I  Cognition  Mod I  Pain  less<3  Safety/Judgment  min assist   Therapy Plan: PT Intensity: Minimum of 1-2 x/day ,45 to 90 minutes PT Frequency: 5 out of 7 days PT Duration Estimated Length of Stay: 10 to 14 days OT Intensity: Minimum of 1-2 x/day, 45 to 90 minutes OT Frequency: 5 out of 7 days OT Duration/Estimated Length of Stay: 10-14 SLP Intensity: Minumum of 1-2 x/day, 30 to 90 minutes SLP Frequency: 3 to 5 out of 7 days SLP Duration/Estimated Length of Stay: 10-14 days    Team Interventions: Nursing Interventions Patient/Family Education, Discharge Planning,  Skin Care/Wound Management, Pain Management, Medication Management, Disease Management/Prevention, Dysphagia/Aspiration Precaution Training, Psychosocial Support, Cognitive Remediation/Compensation  PT interventions Ambulation/gait training, Warden/rangerBalance/vestibular training, Cognitive remediation/compensation, FirefighterCommunity reintegration, Fish farm managerDME/adaptive equipment instruction, Disease management/prevention, Discharge planning, Functional mobility training, Neuromuscular re-education, Pain management, Patient/family education, Stair training, UE/LE Strength taining/ROM, UE/LE Coordination activities, Therapeutic Exercise, Wheelchair propulsion/positioning, Therapeutic Activities, Psychosocial support  OT Interventions Balance/vestibular training, Discharge planning, Pain management, Self Care/advanced ADL retraining, Therapeutic Activities, UE/LE Coordination activities, Visual/perceptual remediation/compensation, Therapeutic Exercise, Patient/family education, Functional mobility training, Disease mangement/prevention, Cognitive remediation/compensation, FirefighterCommunity reintegration, Fish farm managerDME/adaptive equipment instruction, Neuromuscular re-education, Psychosocial support, UE/LE Strength taining/ROM, Wheelchair propulsion/positioning  SLP Interventions Cognitive remediation/compensation, Financial traderCueing hierarchy, Environmental controls, Dysphagia/aspiration precaution training  TR Interventions    SW/CM Interventions Discharge Planning, Psychosocial Support, Patient/Family Education   Barriers to Discharge MD  Medical stability and gout flare  Nursing      PT Decreased caregiver support, Inaccessible home environment    OT Other (comments)    SLP Decreased caregiver support    SW       Team Discharge Planning: Destination: PT-Home ,OT- Home , SLP-Home Projected Follow-up: PT-Outpatient PT, Home health PT, OT-  Outpatient OT, SLP-None Projected Equipment Needs: PT-Rolling walker with 5" wheels, Cane, OT- Tub/shower bench,  To be determined, 3 in 1 bedside comode, SLP-  Equipment Details: PT- , OT-  Patient/family involved in discharge  planning: PT- Patient,  OT-Patient, SLP-Patient  MD ELOS: 14-16d Medical Rehab Prognosis:  Excellent Assessment:  61 year old male with history of HTN, gout, prior CVA, medication non-compliance who was admitted to [facility] on 11/09/16 with right sided weakness, inability to walk, fall, word finding deficits and right facial droop. History taken from chart review and patient. BP reported to be 240/120 per EMS. He was started on IVF for question AKI with BUN/Scr 24/1.4. MRI brain done revealing acute left internal capsule lacunar infarct and subacute small left frontal white matter infarct and old left> right basal ganglia infarcts and mild parenchymal volume loss. Cardiac echo done showing EF 55-60% with moderate to severe AR and diastolic dysfunction. Carotid dopplers were negative for significant ICA stenosis. Low dose ASA recommended for thrombotic stroke due to small vessel disease and medications added for management of labile blood pressures. He was found to have elevated BS and new diagnosis of diabetes. Swallow evaluation showed mild oral phase dysphagia and diet modified to dysphagia 3 with chopped meats.  Patient with left facial weakness, left sided weakness with balance deficits and has had significant decline in mobility and ability to carry out ADLs.   Now requiring 24/7 Rehab RN,MD, as well as CIR level PT, OT and SLP.  Treatment team will focus on ADLs and mobility with goals set at Mod I   See Team Conference Notes for weekly updates to the plan of care

## 2016-11-15 NOTE — Progress Notes (Signed)
Physical Therapy Session Note  Patient Details  Name: [name] [name] MRN: [id number] Date of Birth: 05/30/1955  Today's Date: 11/15/2016 PT Individual Time: 1515-1542 PT Individual Time Calculation (min): 27 min   Short Term Goals: Week 1:  PT Short Term Goal 1 (Week 1): Pt will ambulate 75 ft with LRAD and supervision PT Short Term Goal 2 (Week 1): Pt will transfer bed to chair with supervision and LRAD PT Short Term Goal 3 (Week 1): Pt will perform sit to stand wtih LRAD wtih supervision and without LOB for 5/5 reps  Skilled Therapeutic Interventions/Progress Updates:    no c/o pain at rest but grimaces with RLE weight bearing in SLS.  Session focus on gait with RW, ankle/hip strategy, and core strength.   Pt transfers throughout session with supervision for sit<>stand and min guard for ambulatory transfers. Gait to and from therapy gym with RW, min cues for upright posture, relaxing shoulders, and walker positioning, min guard for balance.  Standing on foam wedge for ankle/hip strategy, initially mod/max assist with pt hesitant to step onto wedge, when able to use RW, pt progressed to min>supervision with time.  Dynamic sitting on dynadisk with minimized LE support focus on anticipatory postural adjustments and core strength with throwing/catching weighted ball.  Pt returned to room at end of session, positioned in recliner with call bell in reach and needs met.   Therapy Documentation Precautions:  Precautions Precautions: Fall Precaution Comments: (R knee and ankle pain.  h/o R ankle gout.  R knee buckles.) Restrictions Weight Bearing Restrictions: No   See Function Navigator for Current Functional Status.   Therapy/Group: Individual Therapy  Caitlin E Warren 11/15/2016, 3:43 PM  

## 2016-11-15 NOTE — Progress Notes (Signed)
Occupational Therapy Session Note  Patient Details  Name: [name] [name] MRN: [id number] Date of Birth: Sep 11, 1955  Today's Date: 11/15/2016 OT Individual Time: 1103-1130 OT Individual Time Calculation (min): 27 min   Short Term Goals: Week 1:  OT Short Term Goal 1 (Week 1): Pt will stand at sink with MIN A for grooming 2/2 items to improve endurance OT Short Term Goal 2 (Week 1): Pt will stand to wash buttocks during bathing with touching A OT Short Term Goal 3 (Week 1): Pt will complete tub transfer/shower transfer wiht touching A OT Short Term Goal 4 (Week 1): Pt will complete commode/toilet transfer with touching A  Skilled Therapeutic Interventions/Progress Updates:    OT treatment session focused on R UE NMR and fine motor coordination. Pt completed stand-pivot to wc with RW and VC for safety as pt attempted to stand before RW placed in front of pt. Pt brought to therapy gym in wc for time management. Worked on pinch, grip, and rotation with washer, nut, bolt activity using graded sizes. Pt needed verbal cues initially to achieve rotation, but progressed with repetition. Pt returned to room at end of session in wc and left seated in recliner with needs met and chair alarm on.   Therapy Documentation Precautions:  Precautions Precautions: Fall Precaution Comments: (R knee and ankle pain.  h/o R ankle gout.  R knee buckles.) Restrictions Weight Bearing Restrictions: No Pain:  none/denies pain  See Function Navigator for Current Functional Status.   Therapy/Group: Individual Therapy  Valma Cava 11/15/2016, 11:30 AM

## 2016-11-16 ENCOUNTER — Inpatient Hospital Stay (HOSPITAL_COMMUNITY): Payer: BLUE CROSS/BLUE SHIELD | Admitting: Physical Therapy

## 2016-11-16 ENCOUNTER — Inpatient Hospital Stay (HOSPITAL_COMMUNITY): Payer: BLUE CROSS/BLUE SHIELD | Admitting: Speech Pathology

## 2016-11-16 ENCOUNTER — Encounter (HOSPITAL_COMMUNITY): Payer: Self-pay | Admitting: Emergency Medicine

## 2016-11-16 ENCOUNTER — Inpatient Hospital Stay (HOSPITAL_COMMUNITY): Payer: BLUE CROSS/BLUE SHIELD | Admitting: Occupational Therapy

## 2016-11-16 ENCOUNTER — Other Ambulatory Visit: Payer: Self-pay

## 2016-11-16 LAB — GLUCOSE, CAPILLARY
GLUCOSE-CAPILLARY: 101 mg/dL — AB (ref 65–99)
GLUCOSE-CAPILLARY: 131 mg/dL — AB (ref 65–99)
Glucose-Capillary: 106 mg/dL — ABNORMAL HIGH (ref 65–99)
Glucose-Capillary: 150 mg/dL — ABNORMAL HIGH (ref 65–99)

## 2016-11-16 LAB — BASIC METABOLIC PANEL
ANION GAP: 7 (ref 5–15)
BUN: 30 mg/dL — ABNORMAL HIGH (ref 6–20)
CALCIUM: 8.9 mg/dL (ref 8.9–10.3)
CHLORIDE: 105 mmol/L (ref 101–111)
CO2: 26 mmol/L (ref 22–32)
Creatinine, Ser: 1.44 mg/dL — ABNORMAL HIGH (ref 0.61–1.24)
GFR calc Af Amer: 60 mL/min — ABNORMAL LOW (ref >60)
GFR calc non Af Amer: 52 mL/min — ABNORMAL LOW (ref >60)
GLUCOSE: 113 mg/dL — AB (ref 65–99)
Potassium: 3.9 mmol/L (ref 3.5–5.1)
Sodium: 138 mmol/L (ref 135–145)

## 2016-11-16 MED ORDER — METHYLPREDNISOLONE 4 MG PO TBPK
4.0000 mg | ORAL_TABLET | Freq: Three times a day (TID) | ORAL | Status: AC
  Administered 2016-11-17 (×3): 4 mg via ORAL

## 2016-11-16 MED ORDER — METHYLPREDNISOLONE 4 MG PO TBPK
4.0000 mg | ORAL_TABLET | ORAL | Status: AC
  Administered 2016-11-16: 4 mg via ORAL

## 2016-11-16 MED ORDER — METHYLPREDNISOLONE 4 MG PO TBPK
8.0000 mg | ORAL_TABLET | Freq: Every evening | ORAL | Status: AC
  Administered 2016-11-17: 8 mg via ORAL
  Filled 2016-11-16: qty 21

## 2016-11-16 MED ORDER — METHYLPREDNISOLONE 4 MG PO TBPK
8.0000 mg | ORAL_TABLET | Freq: Every morning | ORAL | Status: AC
  Administered 2016-11-16: 8 mg via ORAL
  Filled 2016-11-16: qty 21

## 2016-11-16 MED ORDER — METHYLPREDNISOLONE 4 MG PO TBPK
4.0000 mg | ORAL_TABLET | Freq: Four times a day (QID) | ORAL | Status: AC
  Administered 2016-11-18 – 2016-11-21 (×10): 4 mg via ORAL

## 2016-11-16 MED ORDER — METHYLPREDNISOLONE 4 MG PO TBPK
8.0000 mg | ORAL_TABLET | Freq: Every evening | ORAL | Status: AC
Start: 2016-11-16 — End: 2016-11-16
  Administered 2016-11-16: 8 mg via ORAL

## 2016-11-16 NOTE — Progress Notes (Signed)
Physical Therapy Session Note  Patient Details  Name: [name] [name] MRN: [id number] Date of Birth: 12/25/1955  Today's Date: 11/16/2016 PT Individual Time: 0805-0930 PT Individual Time Calculation (min): 85 min   Short Term Goals: Week 1:  PT Short Term Goal 1 (Week 1): Pt will ambulate 75 ft with LRAD and supervision PT Short Term Goal 2 (Week 1): Pt will transfer bed to chair with supervision and LRAD PT Short Term Goal 3 (Week 1): Pt will perform sit to stand wtih LRAD wtih supervision and without LOB for 5/5 reps  Skilled Therapeutic Interventions/Progress Updates: Pt presented in recliner agreeable to therapy. Pt states ankle still sore but feeling a little better 7/10. Pt ambulated to rehab gym with RW min guard. Participated in standing balance activities in Airex while performing bug sorting and connecting for fine motor activities. Pt encouraged to use R hand with activities. Able to stand approx 10 min on airex with no c/o significant increase in ankle pain and no LOB. Pt performed Berg balance test with score of 40/56 indicating increased risk of falls and use of AD. Score as noted below. Performed seated balance/core strengthening activities including D1/D2 PNF with BUE and 2Kg weighted ball sitting on Airex. Also weighted ball toss from different angles sitting on Airex. Performed supine care stability HS pulls on physioball, LTR with ab set, bridges from physioball x 20 ea. Pt ambulated to day room and used NuStep L5 x 10 min for recipriocal movement and endurance. Pt transported back to room and handed off to SLP for following therapies.      Therapy Documentation Precautions:  Precautions Precautions: Fall Precaution Comments: (R knee and ankle pain.  h/o R ankle gout.  R knee buckles.) Restrictions Weight Bearing Restrictions: No General:   Vital Signs: Therapy Vitals Temp: 98.2 F (36.8 C) Temp Source: Oral Pulse Rate: 72 Resp: 20 BP: (!) 156/92(RN Notified  ) Patient Position (if appropriate): Sitting Oxygen Therapy SpO2: 97 % O2 Device: Not Delivered  See Function Navigator for Current Functional Status.   Therapy/Group: Individual Therapy  Aris Even  Ambrielle Kington, PTA  11/16/2016, 4:35 PM

## 2016-11-16 NOTE — Progress Notes (Signed)
Subjective/Complaints:  States that right ankle pain has not improved, the ankle pain kept him up at night.  He was able to perform physical therapy however midguard transfers 93 feet ambulation with walker. Review of systems negative for chest pain shortness of breath nausea vomiting constipation  Objective: Vital Signs: Blood pressure 136/80, pulse 61, temperature 98.3 F (36.8 C), temperature source Oral, resp. rate 16, height 5\' 10"  (1.778 m), weight 107.5 kg (237 lb), SpO2 100 %. No results found. Results for orders placed or performed during the hospital encounter of 11/12/16 (from the past 72 hour(s))  Glucose, capillary     Status: None   Collection Time: 11/13/16 12:22 PM  Result Value Ref Range   Glucose-Capillary 96 65 - 99 mg/dL  Glucose, capillary     Status: Abnormal   Collection Time: 11/13/16  4:43 PM  Result Value Ref Range   Glucose-Capillary 111 (H) 65 - 99 mg/dL  Glucose, capillary     Status: Abnormal   Collection Time: 11/13/16  9:34 PM  Result Value Ref Range   Glucose-Capillary 120 (H) 65 - 99 mg/dL  Glucose, capillary     Status: Abnormal   Collection Time: 11/14/16  6:31 AM  Result Value Ref Range   Glucose-Capillary 101 (H) 65 - 99 mg/dL   Comment 1 Notify RN   Glucose, capillary     Status: Abnormal   Collection Time: 11/14/16 11:33 AM  Result Value Ref Range   Glucose-Capillary 107 (H) 65 - 99 mg/dL  Glucose, capillary     Status: Abnormal   Collection Time: 11/14/16  4:24 PM  Result Value Ref Range   Glucose-Capillary 122 (H) 65 - 99 mg/dL  Glucose, capillary     Status: Abnormal   Collection Time: 11/14/16  9:16 PM  Result Value Ref Range   Glucose-Capillary 143 (H) 65 - 99 mg/dL  Glucose, capillary     Status: None   Collection Time: 11/15/16  6:22 AM  Result Value Ref Range   Glucose-Capillary 98 65 - 99 mg/dL  Glucose, capillary     Status: None   Collection Time: 11/15/16 12:14 PM  Result Value Ref Range   Glucose-Capillary 81 65 -  99 mg/dL  Glucose, capillary     Status: Abnormal   Collection Time: 11/15/16  4:37 PM  Result Value Ref Range   Glucose-Capillary 122 (H) 65 - 99 mg/dL  Glucose, capillary     Status: Abnormal   Collection Time: 11/15/16  9:27 PM  Result Value Ref Range   Glucose-Capillary 130 (H) 65 - 99 mg/dL   Comment 1 Notify RN   Glucose, capillary     Status: Abnormal   Collection Time: 11/16/16  7:03 AM  Result Value Ref Range   Glucose-Capillary 106 (H) 65 - 99 mg/dL     HEENT: normal Cardio: RRR and no murmur Resp: CTA B/L and unlabored GI: BS positive and Non distended nontender Extremity:  No Edema Skin:   Wound abrasion healing R lateral knee Neuro: Alert/Oriented, Cranial Nerve Abnormalities Right CNVII, Abnormal Motor 3/5 right deltoid, bicep, tricep, finger flexors, 4 at the right hip flexor knee extensor 3- at the ankle plantar flexor 2- at the ankle dorsiflexor. and Dysarthric Musc/Skel:  Other Minimal pain with knee endrange flexion.  Has full range of motion.  No evidence of knee effusion.  No erythema.  There is mild tenderness over the patella, no significant tenderness over the quad tendon or the patellar tendon. General no acute  distress   Assessment/Plan: 1. Functional deficits secondary to right hemiparesis secondary to left IC posterior limb infarct which require 3+ hours per day of interdisciplinary therapy in a comprehensive inpatient rehab setting. Physiatrist is providing close team supervision and 24 hour management of active medical problems listed below. Physiatrist and rehab team continue to assess barriers to discharge/monitor patient progress toward functional and medical goals. FIM: Function - Bathing Position: Shower Body parts bathed by patient: Right arm, Right lower leg, Left lower leg, Left arm, Chest, Abdomen, Front perineal area, Buttocks, Right upper leg, Left upper leg Body parts bathed by helper: Back Assist Level: Supervision or verbal  cues  Function- Upper Body Dressing/Undressing Assist Level: Supervision or verbal cues Function - Lower Body Dressing/Undressing Position: Wheelchair/chair at sink Assist for footwear: Partial/moderate assist Assist for lower body dressing: Touching or steadying assistance (Pt > 75%)(min assist needed to thread LE per OT note)  Function - Toileting Toileting steps completed by patient: Performs perineal hygiene(RN assisted pt to finish cleaning after pt wiped) Toileting steps completed by helper: Adjust clothing prior to toileting, Performs perineal hygiene, Adjust clothing after toileting Toileting Assistive Devices: Grab bar or rail Assist level: Touching or steadying assistance (Pt.75%)  Function - ArchivistToilet Transfers Toilet transfer activity did not occur: Refused Toilet transfer assistive device: Grab bar Assist level to toilet: Moderate assist (Pt 50 - 74%/lift or lower) Assist level from toilet: Moderate assist (Pt 50 - 74%/lift or lower) Assist level to bedside commode (at bedside): 2 helpers(per Rona Ravensiera Craven, NT report) Assist level from bedside commode (at bedside): Moderate assist (Pt 50 - 74%/lift or lower)  Function - Chair/bed transfer Chair/bed transfer method: Ambulatory Chair/bed transfer assist level: Touching or steadying assistance (Pt > 75%) Chair/bed transfer assistive device: Armrests, Walker Chair/bed transfer details: Verbal cues for technique, Verbal cues for precautions/safety, Manual facilitation for weight shifting  Function - Locomotion: Wheelchair Will patient use wheelchair at discharge?: No Max wheelchair distance: (75 ft) Assist Level: Touching or steadying assistance (Pt > 75%) Assist Level: Touching or steadying assistance (Pt > 75%) Wheel 150 feet activity did not occur: Safety/medical concerns(Fatigue) Turns around,maneuvers to table,bed, and toilet,negotiates 3% grade,maneuvers on rugs and over doorsills: No Function - Locomotion:  Ambulation Assistive device: Walker-rolling Max distance: 120 Assist level: Touching or steadying assistance (Pt > 75%) Assist level: Touching or steadying assistance (Pt > 75%) Walk 50 feet with 2 turns activity did not occur: Safety/medical concerns(R ankle pain/R knee weakness) Assist level: Touching or steadying assistance (Pt > 75%) Walk 150 feet activity did not occur: Safety/medical concerns Walk 10 feet on uneven surfaces activity did not occur: Safety/medical concerns  Function - Comprehension Comprehension: Auditory Comprehension assist level: Follows basic conversation/direction with no assist  Function - Expression Expression: Verbal Expression assist level: Expresses basic needs/ideas: With no assist  Function - Social Interaction Social Interaction assist level: Interacts appropriately with others with medication or extra time (anti-anxiety, antidepressant).  Function - Problem Solving Problem solving assist level: Solves basic problems with no assist  Function - Memory Memory assist level: Recognizes or recalls 75 - 89% of the time/requires cueing 10 - 24% of the time Patient normally able to recall (first 3 days only): Current season, That he or she is in a hospital, Location of own room  Medical Problem List and Plan: 1.  Right  facial weakness, Right sided weakness with balance deficits secondary to left internal capsule CVA with history of CVA. Continue CIR PT OT 2.  DVT Prophylaxis/Anticoagulation:  Pharmaceutical: Lovenox 3. Pain Management: will order tramadol and/or tylenol prn 4. Mood: LCSW to follow for evaluation and support.  5. Neuropsych: This patient is capable of making decisions on his own behalf. 6. Skin/Wound Care: routine pressure relief measures.  7. Fluids/Electrolytes/Nutrition: Maintain adequate nutrition/hydration. 8. Right knee pain:Appears to be in patellar area Post fall in hospital--X rays reported to be negative. Local measures. Start  voltaren gel 9. HTN: Monitor BP bid--continue metoprolol and lisinopril. Permissive HTN to prevent hypoperfusion.  Will start more aggressive control after 1 week post stroke Vitals:   11/15/16 1429 11/16/16 0531  BP: (!) 128/99 136/80  Pulse: 75 61  Resp: 20 16  Temp: 98.1 F (36.7 C) 98.3 F (36.8 C)  SpO2: 100% 100%  Controlled 11/16/2016                            10. CKD?: baseline SCr- 1.4. Stable at 1.38, monitor on colchcine 11. New diagnosis T2DM: Monitor BS ac/hs. Will start low dose amaryl and titrate as indicated. Will use SSI for elevated BS and tighter control. Consult dietitian for dietary education.  CBG (last 3)  Recent Labs    11/15/16 1637 11/15/16 2127 11/16/16 0703  GLUCAP 122* 130* 106*  Controlled 11/13-expect elevated blood sugars with Medrol dose pack  12. Fluid overload/Chest pain: Has seen cardiology as an outpatient in the past.  Chest x-ray improved after Lasix.Ej fx 65% 13.  Shortness of breath uses albuterol at home presumed asthma versus COPD. 14.  Gout flareup, unresponsive to colchicine.  Will prescribe Medrol Dosepak  LOS (Days) 4 A FACE TO FACE EVALUATION WAS PERFORMED  KIRSTEINS,ANDREW E 11/16/2016, 7:06 AM

## 2016-11-16 NOTE — Progress Notes (Signed)
Speech Language Pathology Daily Session Note  Patient Details  Name: [name] [name] MRN: [id number] Date of Birth: [birth year]  Today's Date: 11/16/2016 SLP Individual Time: 0930-1030 SLP Individual Time Calculation (min): 60 min  Short Term Goals: Week 1: SLP Short Term Goal 1 (Week 1): Pt will demonstrate trials of regular textured foods with efficient mastication without overt s/s aspiration to demonstrate readiness for diet advancement. SLP Short Term Goal 2 (Week 1): Pt will utilize speech intelligiblity strategies in conversation with supervision A verbal and question cues to achieve 90% intelligiblity.  SLP Short Term Goal 3 (Week 1): Pt will demonstrate word finding strategies in semi-complex functional tasks with supervision A verbal and question cues.  SLP Short Term Goal 4 (Week 1): Pt will use external memory aid to recall new semi-complex information with supervision A verbal and question cues.  SLP Short Term Goal 5 (Week 1): Pt will demonstrate functional probelm solving skills in semi-complex tasks at Mod I level.  SLP Short Term Goal 6 (Week 1): Pt will demonstrate anticipatorey awareness and identify 3 tasks he can particpate in at home safely with supervision A verbal and question cues.  Skilled Therapeutic Interventions: Skilled treatment session focused on dysphagia and cognitive goals. SLP facilitated session by providing trials of regular textures. Patient demonstrated efficient mastication with minimal oral residue that patient independently cleared with a liquid wash. Recommend trial tray prior to upgrade. Patient also participated in a basic money management task with supervision verbal cues. Patient transferred back to recliner at end of session and attempted to stand to use the urinal without requesting assistance form SLP. Patient provided education in regards to importance of having assistance at this time to decrease fall risk. Patient left upright in recliner with  chair alarm on and all needs within reach. Continue with current plan of care.      Function:  Eating Eating   Modified Consistency Diet: No(with trials from SLP ) Eating Assist Level: Set up assist for;Swallowing techniques: self managed   Eating Set Up Assist For: Opening containers       Cognition Comprehension Comprehension assist level: Follows basic conversation/direction with no assist  Expression   Expression assist level: Expresses basic needs/ideas: With no assist  Social Interaction Social Interaction assist level: Interacts appropriately with others with medication or extra time (anti-anxiety, antidepressant).  Problem Solving Problem solving assist level: Solves basic 90% of the time/requires cueing < 10% of the time  Memory Memory assist level: Recognizes or recalls 90% of the time/requires cueing < 10% of the time    Pain Pain Assessment Pain Assessment: No/denies pain  Therapy/Group: Individual Therapy  [name] [name] 11/16/2016, 11:08 AM

## 2016-11-16 NOTE — Progress Notes (Signed)
Physical Therapy Session Note  Patient Details  Name: [name] [name] MRN: [id number] Date of Birth: 22-Jul-1955  Today's Date: 11/16/2016 PT Individual Time: 1400-1430 PT Individual Time Calculation (min): 30 min   Short Term Goals: Week 1:  PT Short Term Goal 1 (Week 1): Pt will ambulate 75 ft with LRAD and supervision PT Short Term Goal 2 (Week 1): Pt will transfer bed to chair with supervision and LRAD PT Short Term Goal 3 (Week 1): Pt will perform sit to stand wtih LRAD wtih supervision and without LOB for 5/5 reps  Skilled Therapeutic Interventions/Progress Updates:    no c/o pain.  Session focus on gait in controlled and simulated home environment and RUE fine motor coordination.  Pt ambulates to and from therapy gym with RW and min guard.  High level gait training through obstacle course to simulate home environment with min assist and max cues for safe speed and walker management during ambulation (pt tending to pick walker up and carry it through obstacle course).  Table top activity for RUE fine motor coordination and strength with pill box activity.  Returned to room at end of session and positioned with chair alarm activated and call bell in reach.   Therapy Documentation Precautions:  Precautions Precautions: Fall Precaution Comments: (R knee and ankle pain.  h/o R ankle gout.  R knee buckles.) Restrictions Weight Bearing Restrictions: No   See Function Navigator for Current Functional Status.   Therapy/Group: Individual Therapy  Stephania FragminCaitlin E Jaelynn Pozo 11/16/2016, 3:20 PM

## 2016-11-16 NOTE — Plan of Care (Signed)
  Progressing Education: Ability to describe self-care measures that may prevent or decrease complications (Diabetes Survival Skills Education) will improve Description Pt able to describe prevention DM complications with min assist  11/16/2016 1207 - Progressing by Alfonso RamusEvans, Sequoia Mincey S, RN RH BLADDER ELIMINATION RH STG MANAGE BLADDER WITH ASSISTANCE Description STG Manage Bladder With min Assistance  11/16/2016 1207 - Progressing by Alfonso RamusEvans, Royalty Domagala S, RN RH SKIN INTEGRITY RH STG SKIN FREE OF INFECTION/BREAKDOWN 11/16/2016 1207 - Progressing by Alfonso RamusEvans, Lelan Cush S, RN RH STG MAINTAIN SKIN INTEGRITY WITH ASSISTANCE Description STG Maintain Skin Integrity With min Assistance.  11/16/2016 1207 - Progressing by Alfonso RamusEvans, Courtnei Ruddell S, RN RH SAFETY RH STG ADHERE TO SAFETY PRECAUTIONS W/ASSISTANCE/DEVICE Description STG Adhere to Safety Precautions With min Assistance/Device.  11/16/2016 1207 - Progressing by Alfonso RamusEvans, Matti Killingsworth S, RN RH STG DECREASED RISK OF FALL WITH ASSISTANCE Description STG Decreased Risk of Fall With Assistance. 11/16/2016 1207 - Progressing by Alfonso RamusEvans, Delisha Peaden S, RN RH COGNITION-NURSING RH STG USES MEMORY AIDS/STRATEGIES W/ASSIST TO PROBLEM SOLVE Description STG Uses Memory Aids/Strategies With min Assistance to Problem Solve.  11/16/2016 1207 - Progressing by Alfonso RamusEvans, Elenna Spratling S, RN RH PAIN MANAGEMENT RH STG PAIN MANAGED AT OR BELOW PT'S PAIN GOAL Description <3 (scale of 0-10)  11/16/2016 1207 - Progressing by Alfonso RamusEvans, Edgerrin Correia S, RN RH KNOWLEDGE DEFICIT RH STG INCREASE KNOWLEDGE OF DIABETES Description Pt will demonstrate increased knowledge of DM at discharge with Mod I assist using cues/printed resources  11/16/2016 1207 - Progressing by Alfonso RamusEvans, Keaton Beichner S, RN RH STG INCREASE KNOWLEDGE OF HYPERTENSION Description Pt will demonstrate increased knowledge of DM at discharge with Mod I assist using cues/printed resources  11/16/2016 1207 - Progressing by Alfonso RamusEvans, Robie Mcniel S, RN

## 2016-11-16 NOTE — Progress Notes (Signed)
Occupational Therapy Session Note  Patient Details  Name: [name] [name] MRN: [id number] Date of Birth: January 18, 1955  Today's Date: 11/16/2016 OT Individual Time: 7505-1833 OT Individual Time Calculation (min): 56 min    Short Term Goals: Week 1:  OT Short Term Goal 1 (Week 1): Pt will stand at sink with MIN A for grooming 2/2 items to improve endurance OT Short Term Goal 2 (Week 1): Pt will stand to wash buttocks during bathing with touching A OT Short Term Goal 3 (Week 1): Pt will complete tub transfer/shower transfer wiht touching A OT Short Term Goal 4 (Week 1): Pt will complete commode/toilet transfer with touching A  Skilled Therapeutic Interventions/Progress Updates:    OT treatment session focused on fine-motor coordination, R NMR, and functional ambulation. B UE strength/coordination w/ wc propulsion to therapy gym. Pt came into quadruped on therapy mat with min A. Focus on weight shifting, weight bearing, core strength, and functional use of R UE with quadruped card matching task. <in tactile cues to facilite weight shift when reaching outside base pf support. Continues working on [facility] with card sorting and shuffling. Progressed to red theraputty hand there-ex focused on in-hand manipulation, rotation, and pinch strength. Pt completed 10 sit<>stands with facilitation for anterior weight shift, then ambulated back to room with min A and RW. Pt left seated in recliner with chair alarm on and needs met .  Therapy Documentation Precautions:  Precautions Precautions: Fall Precaution Comments: (R knee and ankle pain.  h/o R ankle gout.  R knee buckles.) Restrictions Weight Bearing Restrictions: No Pain: Pain Assessment Pain Assessment: 0-10 Pain Score: 3  Pain Location: Ankle Pain Orientation: Right Pain Descriptors / Indicators: Aching Pain Onset: With Activity Pain Intervention(s): Repositioned  See Function Navigator for Current Functional Status.   Therapy/Group:  Individual Therapy  Valma Cava 11/16/2016, 12:05 PM

## 2016-11-17 ENCOUNTER — Inpatient Hospital Stay (HOSPITAL_COMMUNITY): Payer: BLUE CROSS/BLUE SHIELD | Admitting: Speech Pathology

## 2016-11-17 ENCOUNTER — Inpatient Hospital Stay (HOSPITAL_COMMUNITY): Payer: BLUE CROSS/BLUE SHIELD | Admitting: *Deleted

## 2016-11-17 ENCOUNTER — Inpatient Hospital Stay (HOSPITAL_COMMUNITY): Payer: BLUE CROSS/BLUE SHIELD | Admitting: Physical Therapy

## 2016-11-17 ENCOUNTER — Inpatient Hospital Stay (HOSPITAL_COMMUNITY): Payer: BLUE CROSS/BLUE SHIELD | Admitting: Occupational Therapy

## 2016-11-17 ENCOUNTER — Ambulatory Visit (HOSPITAL_BASED_OUTPATIENT_CLINIC_OR_DEPARTMENT_OTHER): Payer: BLUE CROSS/BLUE SHIELD

## 2016-11-17 DIAGNOSIS — G8191 Hemiplegia, unspecified affecting right dominant side: Secondary | ICD-10-CM

## 2016-11-17 DIAGNOSIS — M10071 Idiopathic gout, right ankle and foot: Secondary | ICD-10-CM

## 2016-11-17 LAB — GLUCOSE, CAPILLARY
GLUCOSE-CAPILLARY: 112 mg/dL — AB (ref 65–99)
GLUCOSE-CAPILLARY: 110 mg/dL — AB (ref 65–99)
Glucose-Capillary: 122 mg/dL — ABNORMAL HIGH (ref 65–99)
Glucose-Capillary: 150 mg/dL — ABNORMAL HIGH (ref 65–99)
Glucose-Capillary: 181 mg/dL — ABNORMAL HIGH (ref 65–99)

## 2016-11-17 NOTE — Patient Care Conference (Signed)
Inpatient RehabilitationTeam Conference and Plan of Care Update Date: 11/17/2016   Time: 11:00 AM    Patient Name: [name] [name]      Medical Record Number: [id number]  Date of Birth: December 10, 1955 Sex: Male         Room/Bed: 4M04C/4M04C-01 Payor Info: Payor: BLUE CROSS BLUE [employer]IELD / Plan: BCBS OTHER / Product Type: *No Product type* /    Admitting Diagnosis: L CVA  Admit Date/Time:  11/12/2016  4:53 PM Admission Comments: No comment available   Primary Diagnosis:  <principal problem not specified> Principal Problem: <principal problem not specified>  Patient Active Problem List   Diagnosis Date Noted  . Stroke due to embolism of left middle cerebral artery (HCC) 11/12/2016  . Shortness of breath   . SOB (shortness of breath)   . Left hemiparesis (HCC)   . Acute pain of right knee   . Benign essential HTN   . Stage 3 chronic kidney disease (HCC)   . Newly diagnosed diabetes (HCC)   . Chest pain   . Atypical chest pain 10/14/2016  . Abnormal EKG 09/10/2016  . Sleep disorder 09/10/2016  . Benign essential hypertension 09/10/2016  . Elevated cholesterol 09/10/2016    Expected Discharge Date: Expected Discharge Date: 11/24/16  Team Members Present: Physician leading conference: Dr. Claudette LawsAndrew Kirsteins Social Worker Present: Dossie DerBecky Earnest Mcgillis, LCSW Nurse Present: Regino SchultzeHilary Lilja, RN PT Present: Teodoro Kilaitlin Penven-Crew, PT OT Present: Kearney HardElisabeth Doe, OT SLP Present: Feliberto Gottronourtney Payne, SLP PPS Coordinator present : Tora DuckMarie Noel, RN, CRRN     Current Status/Progress Goal Weekly Team Focus  Medical   Gout affecting right ankle, did not respond well to colchicine  Get over current gout flare and increase prophylactic medications  Gout treatment with steroid taper   Bowel/Bladder   continent of bowel & bladder, LBM 11/14  remain continent  continue to monitor & assist as needed   Swallow/Nutrition/ Hydration             ADL's   Min A/supervision overall  supervision/mod I  modified  bathing/dressing, R NMR,     Mobility   min guard overall, more cuing for safety in distracting/cluttered environments  mod I goals  balance, safety awareness, gait with LRAD   Communication             Safety/Cognition/ Behavioral Observations            Pain   c/o knee & ankle pain, has tylenol & tramadol prn  pain scale <4  continue to assess & treat as needed   Skin   no skin break down  no new breakdown  assess q shift      *See Care Plan and progress notes for long and short-term goals.     Barriers to Discharge  Current Status/Progress Possible Resolutions Date Resolved   Physician    Medical stability;Weight bearing restrictions  he has problems with weightbearing due to pain right ankle  Progressing towards goals  Medrol Dosepak      Nursing                  PT  Decreased caregiver support;Inaccessible home environment                 OT                  SLP                SW  Discharge Planning/Teaching Needs:  Home with wife and foster son-will not have 24 hr care.      Team Discussion:  Goals mod/i level. Trials of regular textures today and hope to upgrade his diet. MD treating his gout flare in his ankle. BERG 40/56 . Making good progress in therapies.  Revisions to Treatment Plan:  DC 11/21    Continued Need for Acute Rehabilitation Level of Care: The patient requires daily medical management by a physician with specialized training in physical medicine and rehabilitation for the following conditions: Daily direction of a multidisciplinary physical rehabilitation program to ensure safe treatment while eliciting the highest outcome that is of practical value to the patient.: Yes Daily medical management of patient stability for increased activity during participation in an intensive rehabilitation regime.: Yes Daily analysis of laboratory values and/or radiology reports with any subsequent need for medication adjustment of medical  intervention for : Neurological problems;Other  [name] [name], [name] [name] 11/17/2016, 12:33 PM

## 2016-11-17 NOTE — Progress Notes (Signed)
Speech Language Pathology Daily Session Note  Patient Details  Name: [name] CurlingJon M Fonte MRN: [id number] Date of Birth: [birth year]  Today's Date: 11/17/2016 SLP Individual Time: 1135-1200 SLP Individual Time Calculation (min): 25 min  Short Term Goals: Week 1: SLP Short Term Goal 1 (Week 1): Pt will demonstrate trials of regular textured foods with efficient mastication without overt s/s aspiration to demonstrate readiness for diet advancement. SLP Short Term Goal 2 (Week 1): Pt will utilize speech intelligiblity strategies in conversation with supervision A verbal and question cues to achieve 90% intelligiblity.  SLP Short Term Goal 3 (Week 1): Pt will demonstrate word finding strategies in semi-complex functional tasks with supervision A verbal and question cues.  SLP Short Term Goal 4 (Week 1): Pt will use external memory aid to recall new semi-complex information with supervision A verbal and question cues.  SLP Short Term Goal 5 (Week 1): Pt will demonstrate functional probelm solving skills in semi-complex tasks at Mod I level.  SLP Short Term Goal 6 (Week 1): Pt will demonstrate anticipatorey awareness and identify 3 tasks he can particpate in at home safely with supervision A verbal and question cues.  Skilled Therapeutic Interventions: Skilled treatment session focused on dysphagia and speech goals. SLP facilitated session by providing skilled observation with lunch meal of regular textures. Patient demonstrated efficient mastication with complete oral clearance and without overt s/s of aspiration. Therefore, recommend patient upgrade to regular textures. Patient was Mod I for word-finding throughout session at the sentence level but required supervision verbal cues for increased vocal intensity. Patient left upright in recliner with family present. Continue with current plan of care.      Function:  Eating Eating   Modified Consistency Diet: No Eating Assist Level: Set up assist  for;Swallowing techniques: self managed   Eating Set Up Assist For: Opening containers       Cognition Comprehension Comprehension assist level: Follows basic conversation/direction with no assist  Expression   Expression assist level: Expresses basic needs/ideas: With no assist  Social Interaction Social Interaction assist level: Interacts appropriately with others with medication or extra time (anti-anxiety, antidepressant).  Problem Solving Problem solving assist level: Solves basic 90% of the time/requires cueing < 10% of the time  Memory Memory assist level: Recognizes or recalls 90% of the time/requires cueing < 10% of the time    Pain No reports of pain   Therapy/Group: Individual Therapy  Violette Morneault 11/17/2016, 12:53 PM

## 2016-11-17 NOTE — Progress Notes (Signed)
Physical Therapy Session Note  Patient Details  Name: [name] [name] MRN: [id number] Date of Birth: 1955-09-25  Today's Date: 11/17/2016 PT Individual Time: 1015-1100 PT Individual Time Calculation (min): 45 min   Short Term Goals: Week 1:  PT Short Term Goal 1 (Week 1): Pt will ambulate 75 ft with LRAD and supervision PT Short Term Goal 2 (Week 1): Pt will transfer bed to chair with supervision and LRAD PT Short Term Goal 3 (Week 1): Pt will perform sit to stand wtih LRAD wtih supervision and without LOB for 5/5 reps  Skilled Therapeutic Interventions/Progress Updates:    no c/o pain, reports gout pain is improving.  Session focus on safety awareness, balance, and NMR for weight shifting.    Pt transfers throughout session with supervision and RW.  Gait throughout unit, max distance 200', with RW and min guard fade to supervision.  PT providing verbal cues for walker safety with navigation of obstacles as pt continues to lift walker off the ground and reach it around the obstacles.  Biodex limits of stability training with UE support for NMR and weight shifting as below.  Pt returned to room at end of session and positioned in recliner with chair alarm activated and call bell in reach.   Biodex: Static base: 70% Level 7 base: 64% Level 1 base: 45% > 65%   Therapy Documentation Precautions:  Precautions Precautions: Fall Precaution Comments: (R knee and ankle pain.  h/o R ankle gout.  R knee buckles.) Restrictions Weight Bearing Restrictions: No   See Function Navigator for Current Functional Status.   Therapy/Group: Individual Therapy  Stephania FragminCaitlin E [name] 11/17/2016, 12:08 PM

## 2016-11-17 NOTE — Progress Notes (Signed)
Subjective/Complaints:  No issues overnite, right ankle did not keep him awake  Review of systems negative for chest pain shortness of breath nausea vomiting constipation  Objective: Vital Signs: Blood pressure (!) 157/80, pulse 83, temperature 97.9 F (36.6 C), temperature source Oral, resp. rate 18, height '5\' 10"'  (1.778 m), weight 107.5 kg (237 lb), SpO2 100 %. No results found. Results for orders placed or performed during the hospital encounter of 11/12/16 (from the past 72 hour(s))  Glucose, capillary     Status: Abnormal   Collection Time: 11/14/16 11:33 AM  Result Value Ref Range   Glucose-Capillary 107 (H) 65 - 99 mg/dL  Glucose, capillary     Status: Abnormal   Collection Time: 11/14/16  4:24 PM  Result Value Ref Range   Glucose-Capillary 122 (H) 65 - 99 mg/dL  Glucose, capillary     Status: Abnormal   Collection Time: 11/14/16  9:16 PM  Result Value Ref Range   Glucose-Capillary 143 (H) 65 - 99 mg/dL  Glucose, capillary     Status: None   Collection Time: 11/15/16  6:22 AM  Result Value Ref Range   Glucose-Capillary 98 65 - 99 mg/dL  Glucose, capillary     Status: None   Collection Time: 11/15/16 12:14 PM  Result Value Ref Range   Glucose-Capillary 81 65 - 99 mg/dL  Glucose, capillary     Status: Abnormal   Collection Time: 11/15/16  4:37 PM  Result Value Ref Range   Glucose-Capillary 122 (H) 65 - 99 mg/dL  Glucose, capillary     Status: Abnormal   Collection Time: 11/15/16  9:27 PM  Result Value Ref Range   Glucose-Capillary 130 (H) 65 - 99 mg/dL   Comment 1 Notify RN   Basic metabolic panel     Status: Abnormal   Collection Time: 11/16/16  5:55 AM  Result Value Ref Range   Sodium 138 135 - 145 mmol/L   Potassium 3.9 3.5 - 5.1 mmol/L   Chloride 105 101 - 111 mmol/L   CO2 26 22 - 32 mmol/L   Glucose, Bld 113 (H) 65 - 99 mg/dL   BUN 30 (H) 6 - 20 mg/dL   Creatinine, Ser 1.44 (H) 0.61 - 1.24 mg/dL   Calcium 8.9 8.9 - 10.3 mg/dL   GFR calc non Af Amer 52  (L) >60 mL/min   GFR calc Af Amer 60 (L) >60 mL/min    Comment: (NOTE) The eGFR has been calculated using the CKD EPI equation. This calculation has not been validated in all clinical situations. eGFR's persistently <60 mL/min signify possible Chronic Kidney Disease.    Anion gap 7 5 - 15  Glucose, capillary     Status: Abnormal   Collection Time: 11/16/16  7:03 AM  Result Value Ref Range   Glucose-Capillary 106 (H) 65 - 99 mg/dL  Glucose, capillary     Status: Abnormal   Collection Time: 11/16/16 11:53 AM  Result Value Ref Range   Glucose-Capillary 101 (H) 65 - 99 mg/dL  Glucose, capillary     Status: Abnormal   Collection Time: 11/16/16  4:18 PM  Result Value Ref Range   Glucose-Capillary 131 (H) 65 - 99 mg/dL  Glucose, capillary     Status: Abnormal   Collection Time: 11/16/16  8:58 PM  Result Value Ref Range   Glucose-Capillary 150 (H) 65 - 99 mg/dL  Glucose, capillary     Status: Abnormal   Collection Time: 11/17/16  6:43 AM  Result Value  Ref Range   Glucose-Capillary 122 (H) 65 - 99 mg/dL     HEENT: normal Cardio: RRR and no murmur Resp: CTA B/L and unlabored GI: BS positive and Non distended nontender Extremity:  No Edema Skin:   Wound abrasion healing R lateral knee Neuro: Alert/Oriented, Cranial Nerve Abnormalities Right CNVII, Abnormal Motor 3/5 right deltoid, bicep, tricep, finger flexors, 4 at the right hip flexor knee extensor 3- at the ankle plantar flexor 2- at the ankle dorsiflexor. and Dysarthric Musc/Skel:  Other Minimal pain with knee endrange flexion.  Has full range of motion.  No evidence of knee effusion.  No erythema.  There is mild tenderness over the patella, no significant tenderness over the quad tendon or the patellar tendon. General no acute distress   Assessment/Plan: 1. Functional deficits secondary to right hemiparesis secondary to left IC posterior limb infarct which require 3+ hours per day of interdisciplinary therapy in a comprehensive  inpatient rehab setting. Physiatrist is providing close team supervision and 24 hour management of active medical problems listed below. Physiatrist and rehab team continue to assess barriers to discharge/monitor patient progress toward functional and medical goals. FIM: Function - Bathing Position: Shower Body parts bathed by patient: Right arm, Right lower leg, Left lower leg, Left arm, Chest, Abdomen, Front perineal area, Buttocks, Right upper leg, Left upper leg Body parts bathed by helper: Back Assist Level: Supervision or verbal cues  Function- Upper Body Dressing/Undressing Assist Level: Supervision or verbal cues Function - Lower Body Dressing/Undressing Position: Wheelchair/chair at sink Assist for footwear: Partial/moderate assist Assist for lower body dressing: Touching or steadying assistance (Pt > 75%)(min assist needed to thread LE per OT note)  Function - Toileting Toileting steps completed by patient: Performs perineal hygiene(RN assisted pt to finish cleaning after pt wiped) Toileting steps completed by helper: Adjust clothing prior to toileting, Performs perineal hygiene, Adjust clothing after toileting Toileting Assistive Devices: Grab bar or rail Assist level: Touching or steadying assistance (Pt.75%)  Function - Air cabin crew transfer activity did not occur: Refused Toilet transfer assistive device: Grab bar Assist level to toilet: Moderate assist (Pt 50 - 74%/lift or lower) Assist level from toilet: Moderate assist (Pt 50 - 74%/lift or lower) Assist level to bedside commode (at bedside): 2 helpers(per Elmo Putt, NT report) Assist level from bedside commode (at bedside): Moderate assist (Pt 50 - 74%/lift or lower)  Function - Chair/bed transfer Chair/bed transfer method: Ambulatory Chair/bed transfer assist level: Touching or steadying assistance (Pt > 75%) Chair/bed transfer assistive device: Armrests, Walker Chair/bed transfer details: Verbal  cues for technique, Verbal cues for precautions/safety, Manual facilitation for weight shifting  Function - Locomotion: Wheelchair Will patient use wheelchair at discharge?: No Max wheelchair distance: (75 ft) Assist Level: Touching or steadying assistance (Pt > 75%) Assist Level: Touching or steadying assistance (Pt > 75%) Wheel 150 feet activity did not occur: Safety/medical concerns(Fatigue) Turns around,maneuvers to table,bed, and toilet,negotiates 3% grade,maneuvers on rugs and over doorsills: No Function - Locomotion: Ambulation Assistive device: Walker-rolling Max distance: 150 Assist level: Touching or steadying assistance (Pt > 75%) Assist level: Touching or steadying assistance (Pt > 75%) Walk 50 feet with 2 turns activity did not occur: Safety/medical concerns(R ankle pain/R knee weakness) Assist level: Touching or steadying assistance (Pt > 75%) Walk 150 feet activity did not occur: Safety/medical concerns Assist level: Touching or steadying assistance (Pt > 75%) Walk 10 feet on uneven surfaces activity did not occur: Safety/medical concerns  Function - Comprehension Comprehension: Auditory Comprehension assist  level: Follows basic conversation/direction with no assist  Function - Expression Expression: Verbal Expression assist level: Expresses basic needs/ideas: With no assist  Function - Social Interaction Social Interaction assist level: Interacts appropriately with others with medication or extra time (anti-anxiety, antidepressant).  Function - Problem Solving Problem solving assist level: Solves basic 90% of the time/requires cueing < 10% of the time  Function - Memory Memory assist level: Recognizes or recalls 90% of the time/requires cueing < 10% of the time Patient normally able to recall (first 3 days only): Current season, That he or she is in a hospital, Location of own room  Medical Problem List and Plan: 1.  Right  facial weakness, Right sided weakness  with balance deficits secondary to left internal capsule CVA with history of CVA. Continue CIR PT OT 2.  DVT Prophylaxis/Anticoagulation: Pharmaceutical: Lovenox 3. Pain Management: will order tramadol and/or tylenol prn 4. Mood: LCSW to follow for evaluation and support.  5. Neuropsych: This patient is capable of making decisions on his own behalf. 6. Skin/Wound Care: routine pressure relief measures.  7. Fluids/Electrolytes/Nutrition: Maintain adequate nutrition/hydration. 8. Right knee pain:Appears to be in patellar area Post fall in hospital--X rays reported to be negative. Local measures. Start voltaren gel 9. HTN: Monitor BP bid--continue metoprolol and lisinopril. Permissive HTN to prevent hypoperfusion.  Will start more aggressive control after 1 week post stroke Vitals:   11/16/16 1450 11/17/16 0610  BP: (!) 156/92 (!) 157/80  Pulse: 72 83  Resp: 20 18  Temp: 98.2 F (36.8 C) 97.9 F (36.6 C)  SpO2: 97% 100%  Controlled 11/16/2016                                                                                                                           10. CKD?: baseline SCr- 1.4. Stable at 1.44,11/13 11. New diagnosis T2DM: Monitor BS ac/hs. Will start low dose amaryl and titrate as indicated. Will use SSI for elevated BS and tighter control. Consult dietitian for dietary education.  CBG (last 3)  Recent Labs    11/16/16 1618 11/16/16 2058 11/17/16 0643  GLUCAP 131* 150* 122*  Controlled 11/14-expect elevated blood sugars with Medrol dose pack  12. Fluid overload/Chest pain: Has seen cardiology as an outpatient in the past.  Chest x-ray improved after Lasix.Ej fx 65% 13.  Shortness of breath uses albuterol at home presumed asthma versus COPD. 14.  Gout flareup, unresponsive to colchicine.  Will prescribe Medrol Dosepak  LOS (Days) 5 A FACE TO FACE EVALUATION WAS PERFORMED  Jacquel Redditt E 11/17/2016, 7:40 AM

## 2016-11-17 NOTE — Progress Notes (Signed)
Social Work Patient ID: [name] [name], male   DOB: 1955/08/08, 61 y.o.   MRN: [id number]  Met with pt and his parent's to discuss team conference goals mod/i level and target discharge 11/21. Pt will need to be mod/i since will be home alone while wife works. He is feeling better today with his gout and is moving better. His ankle is still swollen and red. He is pleased with the plan and will work toward this.

## 2016-11-17 NOTE — Evaluation (Signed)
Recreational Therapy Assessment and Plan  Patient Details  Name: JAJUAN SKOOG MRN: [id number] Date of Birth: [birth year] Today's Date: 11/17/2016  Rehab Potential: Good ELOS: 10 days   Assessment Problem List:      Patient Active Problem List   Diagnosis Date Noted  . Stroke due to embolism of left middle cerebral artery ([place]) 11/12/2016  . Shortness of breath   . SOB (shortness of breath)   . Left hemiparesis ([place])   . Acute pain of right knee   . Benign essential HTN   . Stage 3 chronic kidney disease ([place])   . Newly diagnosed diabetes ([place])   . Chest pain   . Atypical chest pain 10/14/2016  . Abnormal EKG 09/10/2016  . Sleep disorder 09/10/2016  . Benign essential hypertension 09/10/2016  . Elevated cholesterol 09/10/2016    Past Medical History:  Past Medical History:  Diagnosis Date  . CVA (cerebral vascular accident) ([place]) 2014  . Hypertension   . Obesity (BMI 30.0-34.9)   . Sleep disorder    Past Surgical History:       Past Surgical History:  Procedure Laterality Date  . Arthroscopic knee surgery    . TONSILLECTOMY      Assessment & Plan Clinical Impression: Patient is a 61 y.o. year old male with recent admission to the hospital on 11-09-16 with history of HTN, gout, prior CVA, medication non-compliance who was admitted with right sided weakness, inability to walk, fall, word finding deficits and right facial droop.History taken from chart review and patient.BP reported to be 240/120 per EMS. He was started on IVF for question AKI with BUN/Scr 24/1.4. MRI brain done revealing acute left internal capsule lacunar infarct and subacute small left frontal white matter infarct and old left>right basal ganglia infarcts and mild parenchymal volume loss. Cardiac echo done showing EF 55-60% with moderate to severe AR and diastolic dysfunction. Carotid dopplers were negative for significant ICA stenosis. Low dose ASA recommended for thrombotic  stroke due to small vessel disease and medications added for management of labile blood pressures. He was found to have elevated BS and new diagnosis of diabetes. Swallow evaluation showed mild oral phase dysphagia and diet modified to dysphagia 3 with chopped meats. Patient with left facial weakness, left sided weakness with balance deficits and has had significant decline in mobility and ability to carry out ADLs. CIR recommended by rehab team. Upon arrival, patient noted to have chest pain with SOB.  Patient transferred to CIR on 11/12/2016.   Pt presents with decreased activity tolerance, decreased functional mobility, decreased balance, decreased coordination, decreased safety awareness   Limiting pt's independence with leisure/community pursuits.  Leisure History/Participation Premorbid leisure interest/current participation: Community - Doctor, hospital - Travel (Comment);Community - Other (Comment);Herbalist Leisure Participation Style: With Family/Friends Awareness of Community Resources: Good-identify 3 post discharge leisure resources Psychosocial / Spiritual Patient agreeable to Pet Therapy: Yes Does patient have pets?: Yes Social interaction - Mood/Behavior: Cooperative Academic librarian Appropriate for Education?: Yes Patient Agreeable to Outing?: Yes Recreational Therapy Orientation Orientation -Reviewed with patient: Available activity resources Strengths/Weaknesses Patient Strengths/Abilities: Willingness to participate;Active premorbidly  Plan Rec Therapy Plan Is patient appropriate for Therapeutic Recreation?: Yes Rehab Potential: Good Treatment times per week: Min 1 TR group/session >25 minutes during LOS Estimated Length of Stay: 10 days TR Treatment/Interventions: Adaptive equipment instruction;Group participation (Comment);Provide activity resources in room;Therapeutic exercise;1:1 session;Community reintegration;Recreation/leisure  participation;Balance/vestibular training;Functional mobility training;Patient/family education;Therapeutic activities  Recommendations for other services: None   Discharge  Criteria: Patient will be discharged from TR if patient refuses treatment 3 consecutive times without medical reason.  If treatment goals not met, if there is a change in medical status, if patient makes no progress towards goals or if patient is discharged from hospital.  The above assessment, treatment plan, treatment alternatives and goals were discussed and mutually agreed upon: by patient  [place] 11/17/2016, 9:12 AM

## 2016-11-17 NOTE — Progress Notes (Signed)
Physical Therapy Session Note  Patient Details  Name: [name] [name] MRN: [id number] Date of Birth: September 03, 1955  Today's Date: 11/17/2016 PT Individual Time: 1638-4536 PT Individual Time Calculation (min): 56 min   Short Term Goals: Week 1:  PT Short Term Goal 1 (Week 1): Pt will ambulate 75 ft with LRAD and supervision PT Short Term Goal 2 (Week 1): Pt will transfer bed to chair with supervision and LRAD PT Short Term Goal 3 (Week 1): Pt will perform sit to stand wtih LRAD wtih supervision and without LOB for 5/5 reps  Skilled Therapeutic Interventions/Progress Updates:    no c/o pain, gout pain is "tolerable"  Session focus on balance, high level gait, and RUE fine motor coordination.    Gait throughout unit with RW and supervision, progress to gait without device and close supervision <> min guard.  PT provided min verbal cues for attention to R foot clearance and safety with obstacle negotiation with good carryover.  Pt completed 3 trials of biodex limits of stability training without UE support 38% > 54% > 38%.  NMR for strengthening and balance with level 1 theraband around thighs side stepping and retro stepping focus on LE positioning and upright posture.  NMR table top activity for fine motor coordination in RUE per pt request with increased time to manipulate small pins to complete design.  Pt ambulated back to room at end of session and positioned in recliner, chair alarm activated, call bell in reach and needs met.   Therapy Documentation Precautions:  Precautions Precautions: Fall Precaution Comments: (R knee and ankle pain.  h/o R ankle gout.  R knee buckles.) Restrictions Weight Bearing Restrictions: No   See Function Navigator for Current Functional Status.   Therapy/Group: Individual Therapy  Michel Santee 11/17/2016, 2:56 PM

## 2016-11-17 NOTE — Progress Notes (Signed)
Occupational Therapy Session Note  Patient Details  Name: [name] [name] MRN: [id number] Date of Birth: 02-Jul-1955  Today's Date: 11/17/2016 OT Individual Time: 3536-1443 OT Individual Time Calculation (min): 56 min    Short Term Goals: Week 1:  OT Short Term Goal 1 (Week 1): Pt will stand at sink with MIN A for grooming 2/2 items to improve endurance OT Short Term Goal 2 (Week 1): Pt will stand to wash buttocks during bathing with touching A OT Short Term Goal 3 (Week 1): Pt will complete tub transfer/shower transfer wiht touching A OT Short Term Goal 4 (Week 1): Pt will complete commode/toilet transfer with touching A  Skilled Therapeutic Interventions/Progress Updates:     OT treatment session focused on standing balance/endurance, functional use of R UE, NMR, and  modified bathing/dressing. Pt ambulated to bathroom with RW and min guard A.VC for safety when turning to sit onto shower chair. Bathing completed with overall set-up A and supervision with min guard A for balance when standing to wash buttocks. LB dressing completed sit<>stand with overall supervision/min guard A for balance. Addressed R Opal and standing balance/endurance with standing grooming tasks of brushing teeth and shaving with electric razor. Pt tolerated 5 mins standing throughout grooming tasks. Pt ambulated to therapy gym with RW and min guard and vc to lift R LE. Addressed standing balance and R pinch/FMC with graded clothes pin task.  [facility] and NMR with writing activity focused on increased smoothness. Pt returned to room and left seated in recliner with needs met.  Therapy Documentation Precautions:  Precautions Precautions: Fall Precaution Comments: (R knee and ankle pain.  h/o R ankle gout.  R knee buckles.) Restrictions Weight Bearing Restrictions: No Pain: Pain Assessment Pain Assessment: 0-10 Pain Score: 4  Pain Type: Chronic pain Pain Orientation: Right Pain Descriptors / Indicators:  Aching;Throbbing Pain Onset: On-going Patients Stated Pain Goal: 5 Pain Intervention(s): Repositioned  See Function Navigator for Current Functional Status.   Therapy/Group: Individual Therapy  Valma Cava 11/17/2016, 9:55 AM

## 2016-11-18 ENCOUNTER — Encounter (HOSPITAL_COMMUNITY): Payer: Self-pay

## 2016-11-18 ENCOUNTER — Inpatient Hospital Stay (HOSPITAL_COMMUNITY): Payer: BLUE CROSS/BLUE SHIELD | Admitting: Speech Pathology

## 2016-11-18 ENCOUNTER — Inpatient Hospital Stay (HOSPITAL_COMMUNITY): Payer: BLUE CROSS/BLUE SHIELD | Admitting: Physical Therapy

## 2016-11-18 ENCOUNTER — Inpatient Hospital Stay (HOSPITAL_COMMUNITY): Payer: BLUE CROSS/BLUE SHIELD | Admitting: Occupational Therapy

## 2016-11-18 LAB — GLUCOSE, CAPILLARY
GLUCOSE-CAPILLARY: 93 mg/dL (ref 65–99)
Glucose-Capillary: 127 mg/dL — ABNORMAL HIGH (ref 65–99)
Glucose-Capillary: 99 mg/dL (ref 65–99)
Glucose-Capillary: 123 mg/dL — ABNORMAL HIGH (ref 65–99)

## 2016-11-18 MED ORDER — LIVING WELL WITH DIABETES BOOK
Freq: Once | Status: AC
  Administered 2016-11-18: 20:00:00
  Filled 2016-11-18: qty 1

## 2016-11-18 MED ORDER — INSULIN STARTER KIT- SYRINGES (ENGLISH)
1.0000 | Freq: Once | Status: DC
  Filled 2016-11-18: qty 1

## 2016-11-18 MED ORDER — LISINOPRIL 20 MG PO TABS
20.0000 mg | ORAL_TABLET | Freq: Every day | ORAL | Status: DC
  Administered 2016-11-18 – 2016-11-23 (×6): 20 mg via ORAL
  Filled 2016-11-18 (×6): qty 1

## 2016-11-18 NOTE — Plan of Care (Signed)
Pt continent of bowel and bladder. Still has swollen Left foot secondary to gout. Denies need for analgesics at this time.

## 2016-11-18 NOTE — Progress Notes (Signed)
Physical Therapy Session Note  Patient Details  Name: [name] [name] MRN: [id number] Date of Birth: 02/08/1955  Today's Date: 11/18/2016 PT Individual Time: 0900-1000 AND 1417-1500 PT Individual Time Calculation (min): 60 min AND 43  Short Term Goals: Week 1:  PT Short Term Goal 1 (Week 1): Pt will ambulate 75 ft with LRAD and supervision PT Short Term Goal 2 (Week 1): Pt will transfer bed to chair with supervision and LRAD PT Short Term Goal 3 (Week 1): Pt will perform sit to stand wtih LRAD wtih supervision and without LOB for 5/5 reps  Skilled Therapeutic Interventions/Progress Updates:   Pt received sitting in arm chair and agreeable to PT. Pt completing puzzle and instructed in problem solving and fine motor task.   Gait training without AD, min- supervision assist from PT x 200ft.   Nustep reciprocal movement and endurance training x 6 minutes with supervision assist and min cues for proper ROM and speed to improved symmetry of movement.   Pt instructed pt in dynamic balance training using Wii Fit for improved weight shifting. Penguin slide x 3, Tilt table x 3, and bubble run x 3. Pt required to provide min assist to facilite improved weight shifting in the RLE as well as improve use of ankle strategy.    PT instructed pt in dual tasking and fine motor control to ambulate while holding tennis ball on tennis racquet. Min assist and min cues for gait pattern and improved foot clearance as well as posture.   Patient returned to room and left sitting in recliner with call bell in reach and all needs met.    Session 2.  PT instructed pt in gait training and path finding tasks in simulated community environment. Pt instructed in locating hospital gift shop, main entrance, and returning to [place] wing of 4th floor. Pt required moderate cues for problem solving strategies for use of signs in hospital and to retain target destination.  Pt instructed in dynamic balance training to tap BLE  on 1-2 cones with min assist from PT. Decreased postural control noted with stance on the RLE.   Patient returned to room and left sitting in recliner with call bell in reach and all needs met.          Therapy Documentation Precautions:  Precautions Precautions: Fall Precaution Comments: (R knee and ankle pain.  h/o R ankle gout.  R knee buckles.) Restrictions Weight Bearing Restrictions: No General:   Vital Signs: Therapy Vitals Pulse Rate: (!) 57 BP: (!) 163/91 Pain: Pain Assessment Pain Score: 5  Pain Location: Ankle Pain Orientation: Right Pain Descriptors / Indicators: Aching Pain Onset: With Activity Pain Intervention(s): Repositioned Mobility:   Locomotion :    Trunk/Postural Assessment :    Balance:   Exercises:   Other Treatments:     See Function Navigator for Current Functional Status.   Therapy/Group: Individual Therapy  Austin E Tucker 11/18/2016, 10:28 AM  

## 2016-11-18 NOTE — Progress Notes (Signed)
Speech Language Pathology Daily Session Note  Patient Details  Name: [name] [name] MRN: [id number] Date of Birth: [birth year]  Today's Date: 11/18/2016 SLP Individual Time: 1115-1200 SLP Individual Time Calculation (min): 45 min  Short Term Goals: Week 1: SLP Short Term Goal 1 (Week 1): Pt will demonstrate trials of regular textured foods with efficient mastication without overt s/s aspiration to demonstrate readiness for diet advancement. SLP Short Term Goal 2 (Week 1): Pt will utilize speech intelligiblity strategies in conversation with supervision A verbal and question cues to achieve 90% intelligiblity.  SLP Short Term Goal 3 (Week 1): Pt will demonstrate word finding strategies in semi-complex functional tasks with supervision A verbal and question cues.  SLP Short Term Goal 4 (Week 1): Pt will use external memory aid to recall new semi-complex information with supervision A verbal and question cues.  SLP Short Term Goal 5 (Week 1): Pt will demonstrate functional probelm solving skills in semi-complex tasks at Mod I level.  SLP Short Term Goal 6 (Week 1): Pt will demonstrate anticipatorey awareness and identify 3 tasks he can particpate in at home safely with supervision A verbal and question cues.  Skilled Therapeutic Interventions: Skilled treatment session focused on dysphagia and speech goals. SLP facilitated session by providing skilled observation with lunch meal of regular textures. Patient demonstrated efficient mastication with complete oral clearance and without overt s/s of aspiration. Recommend patient continue regular textures. Patient also participated in a verbal description task at the sentence level and was Mod I for word-finding and was 100% intelligible. Patient ambulated to and from SLP office with supervision verbal cues for attention to right field of environment. Patient left upright in recliner with family present. Continue with current plan of care.         Function:  Eating Eating   Modified Consistency Diet: No Eating Assist Level: No help, No cues           Cognition Comprehension Comprehension assist level: Follows basic conversation/direction with extra time/assistive device  Expression   Expression assist level: Expresses basic needs/ideas: With no assist  Social Interaction Social Interaction assist level: Interacts appropriately with others with medication or extra time (anti-anxiety, antidepressant).  Problem Solving Problem solving assist level: Solves basic 90% of the time/requires cueing < 10% of the time  Memory Memory assist level: Recognizes or recalls 90% of the time/requires cueing < 10% of the time    Pain No/Denies Pain   Therapy/Group: Individual Therapy  Stefanie Hodgens 11/18/2016, 12:44 PM

## 2016-11-18 NOTE — Progress Notes (Signed)
Subjective/Complaints:  Requesting grounds pass, right ankle doing better  Review of systems negative for chest pain shortness of breath nausea vomiting constipation  Objective: Vital Signs: Blood pressure (!) 163/91, pulse (!) 57, temperature 97.9 F (36.6 C), temperature source Oral, resp. rate 18, height '5\' 10"'  (1.778 m), weight 107.5 kg (237 lb), SpO2 100 %. No results found. Results for orders placed or performed during the hospital encounter of 11/12/16 (from the past 72 hour(s))  Glucose, capillary     Status: None   Collection Time: 11/15/16 12:14 PM  Result Value Ref Range   Glucose-Capillary 81 65 - 99 mg/dL  Glucose, capillary     Status: Abnormal   Collection Time: 11/15/16  4:37 PM  Result Value Ref Range   Glucose-Capillary 122 (H) 65 - 99 mg/dL  Glucose, capillary     Status: Abnormal   Collection Time: 11/15/16  9:27 PM  Result Value Ref Range   Glucose-Capillary 130 (H) 65 - 99 mg/dL   Comment 1 Notify RN   Basic metabolic panel     Status: Abnormal   Collection Time: 11/16/16  5:55 AM  Result Value Ref Range   Sodium 138 135 - 145 mmol/L   Potassium 3.9 3.5 - 5.1 mmol/L   Chloride 105 101 - 111 mmol/L   CO2 26 22 - 32 mmol/L   Glucose, Bld 113 (H) 65 - 99 mg/dL   BUN 30 (H) 6 - 20 mg/dL   Creatinine, Ser 1.44 (H) 0.61 - 1.24 mg/dL   Calcium 8.9 8.9 - 10.3 mg/dL   GFR calc non Af Amer 52 (L) >60 mL/min   GFR calc Af Amer 60 (L) >60 mL/min    Comment: (NOTE) The eGFR has been calculated using the CKD EPI equation. This calculation has not been validated in all clinical situations. eGFR's persistently <60 mL/min signify possible Chronic Kidney Disease.    Anion gap 7 5 - 15  Glucose, capillary     Status: Abnormal   Collection Time: 11/16/16  7:03 AM  Result Value Ref Range   Glucose-Capillary 106 (H) 65 - 99 mg/dL  Glucose, capillary     Status: Abnormal   Collection Time: 11/16/16 11:53 AM  Result Value Ref Range   Glucose-Capillary 101 (H) 65 -  99 mg/dL  Glucose, capillary     Status: Abnormal   Collection Time: 11/16/16  4:18 PM  Result Value Ref Range   Glucose-Capillary 131 (H) 65 - 99 mg/dL  Glucose, capillary     Status: Abnormal   Collection Time: 11/16/16  8:58 PM  Result Value Ref Range   Glucose-Capillary 150 (H) 65 - 99 mg/dL  Glucose, capillary     Status: Abnormal   Collection Time: 11/17/16  6:43 AM  Result Value Ref Range   Glucose-Capillary 122 (H) 65 - 99 mg/dL  Glucose, capillary     Status: Abnormal   Collection Time: 11/17/16  8:55 AM  Result Value Ref Range   Glucose-Capillary 150 (H) 65 - 99 mg/dL  Glucose, capillary     Status: Abnormal   Collection Time: 11/17/16 12:21 PM  Result Value Ref Range   Glucose-Capillary 112 (H) 65 - 99 mg/dL  Glucose, capillary     Status: Abnormal   Collection Time: 11/17/16  4:41 PM  Result Value Ref Range   Glucose-Capillary 110 (H) 65 - 99 mg/dL  Glucose, capillary     Status: Abnormal   Collection Time: 11/17/16  9:23 PM  Result Value Ref  Range   Glucose-Capillary 181 (H) 65 - 99 mg/dL  Glucose, capillary     Status: Abnormal   Collection Time: 11/18/16  6:20 AM  Result Value Ref Range   Glucose-Capillary 127 (H) 65 - 99 mg/dL     HEENT: normal Cardio: RRR and no murmur Resp: CTA B/L and unlabored GI: BS positive and Non distended nontender Extremity:  No Edema Skin:   Erythema Right foot/ankle resolved Neuro: Alert/Oriented, Cranial Nerve Abnormalities Right CNVII, Abnormal Motor 3/5 right deltoid, bicep, tricep, finger flexors, 4 at the right hip flexor knee extensor 3- at the ankle plantar flexor 2- at the ankle dorsiflexor. and Dysarthric Musc/Skel:  Min tenderness over the right ankle  Has full range of motion.  No evidence of knee effusion.  No erythema.  There is mild tenderness over the patella, no significant tenderness over the quad tendon or the patellar tendon. General no acute distress   Assessment/Plan: 1. Functional deficits secondary to  right hemiparesis secondary to left IC posterior limb infarct which require 3+ hours per day of interdisciplinary therapy in a comprehensive inpatient rehab setting. Physiatrist is providing close team supervision and 24 hour management of active medical problems listed below. Physiatrist and rehab team continue to assess barriers to discharge/monitor patient progress toward functional and medical goals. FIM: Function - Bathing Position: Shower Body parts bathed by patient: Right arm, Left arm, Chest, Abdomen, Front perineal area, Buttocks, Right lower leg, Left lower leg, Left upper leg, Right upper leg Body parts bathed by helper: Back Assist Level: Supervision or verbal cues  Function- Upper Body Dressing/Undressing What is the patient wearing?: Pull over shirt/dress Pull over shirt/dress - Perfomed by patient: Thread/unthread right sleeve, Thread/unthread left sleeve, Put head through opening, Pull shirt over trunk Assist Level: Set up Set up : To obtain clothing/put away Function - Lower Body Dressing/Undressing Position: Wheelchair/chair at sink Assist for footwear: Supervision/touching assist Assist for lower body dressing: Touching or steadying assistance (Pt > 75%)  Function - Toileting Toileting steps completed by patient: Performs perineal hygiene, Adjust clothing prior to toileting, Adjust clothing after toileting Toileting steps completed by helper: Adjust clothing prior to toileting, Performs perineal hygiene, Adjust clothing after toileting Toileting Assistive Devices: Grab bar or rail Assist level: Touching or steadying assistance (Pt.75%)  Function - Toilet Transfers Toilet transfer activity did not occur: Refused Toilet transfer assistive device: Grab bar Assist level to toilet: Touching or steadying assistance (Pt > 75%) Assist level from toilet: Touching or steadying assistance (Pt > 75%) Assist level to bedside commode (at bedside): 2 helpers(per Elmo Putt, NT  report) Assist level from bedside commode (at bedside): Moderate assist (Pt 50 - 74%/lift or lower)  Function - Chair/bed transfer Chair/bed transfer method: Ambulatory Chair/bed transfer assist level: Supervision or verbal cues Chair/bed transfer assistive device: Armrests, Walker Chair/bed transfer details: Verbal cues for precautions/safety  Function - Locomotion: Wheelchair Will patient use wheelchair at discharge?: No Max wheelchair distance: (75 ft) Assist Level: Touching or steadying assistance (Pt > 75%) Assist Level: Touching or steadying assistance (Pt > 75%) Wheel 150 feet activity did not occur: Safety/medical concerns(Fatigue) Turns around,maneuvers to table,bed, and toilet,negotiates 3% grade,maneuvers on rugs and over doorsills: No Function - Locomotion: Ambulation Assistive device: Walker-rolling Max distance: 150 Assist level: Touching or steadying assistance (Pt > 75%) Assist level: Touching or steadying assistance (Pt > 75%) Walk 50 feet with 2 turns activity did not occur: Safety/medical concerns(R ankle pain/R knee weakness) Assist level: Touching or steadying assistance (Pt >  75%) Walk 150 feet activity did not occur: Safety/medical concerns Assist level: Touching or steadying assistance (Pt > 75%) Walk 10 feet on uneven surfaces activity did not occur: Safety/medical concerns  Function - Comprehension Comprehension: Auditory Comprehension assist level: Follows basic conversation/direction with extra time/assistive device  Function - Expression Expression: Verbal Expression assist level: Expresses basic needs/ideas: With no assist  Function - Social Interaction Social Interaction assist level: Interacts appropriately with others with medication or extra time (anti-anxiety, antidepressant).  Function - Problem Solving Problem solving assist level: Solves basic 90% of the time/requires cueing < 10% of the time  Function - Memory Memory assist level:  Recognizes or recalls 90% of the time/requires cueing < 10% of the time Patient normally able to recall (first 3 days only): Current season, Staff names and faces, That he or she is in a hospital  Medical Problem List and Plan: 1.  Right  facial weakness, Right sided weakness with balance deficits secondary to left internal capsule CVA with history of CVA. Continue CIR PT OT 2.  DVT Prophylaxis/Anticoagulation: Pharmaceutical: Lovenox 3. Pain Management: will order tramadol and/or tylenol prn 4. Mood: LCSW to follow for evaluation and support.  5. Neuropsych: This patient is capable of making decisions on his own behalf. 6. Skin/Wound Care: routine pressure relief measures.  7. Fluids/Electrolytes/Nutrition: Maintain adequate nutrition/hydration. 8. Right knee pain:Appears to be in patellar area Post fall in hospital--X rays reported to be negative. Local measures. Start voltaren gel 9. HTN: Monitor BP bid--continue metoprolol and lisinopril. Permissive HTN to prevent hypoperfusion.   Vitals:   11/18/16 0614 11/18/16 0656  BP: (!) 157/99 (!) 163/91  Pulse: (!) 59 (!) 57  Resp: 18   Temp: 97.9 F (36.6 C)   SpO2: 100%   elevated 11/18/2016- cannot increase BB due to brady will  Increase zestril                                                                                                                         10. CKD?: baseline SCr- 1.4. Stable at 1.44,11/13 11. New diagnosis T2DM: Monitor BS ac/hs. Will start low dose amaryl and titrate as indicated. Will use SSI for elevated BS and tighter control. Consult dietitian for dietary education.  CBG (last 3)  Recent Labs    11/17/16 1641 11/17/16 2123 11/18/16 0620  GLUCAP 110* 181* 127*  Controlled 11/15,   12. Fluid overload/Chest pain: Has seen cardiology as an outpatient in the past.  Chest x-ray improved after Lasix.Ej fx 65% 13.  Shortness of breath uses albuterol at home presumed asthma versus COPD. 14.  Gout flareup,  unresponsive to colchicine.  Will prescribe Medrol Dosepak improving  LOS (Days) 6 A FACE TO FACE EVALUATION WAS PERFORMED  Tamir Wallman E 11/18/2016, 7:23 AM

## 2016-11-18 NOTE — Progress Notes (Signed)
Occupational Therapy Session Note  Patient Details  Name: [name] [name] MRN: [id number] Date of Birth: 20-Aug-1955  Today's Date: 11/18/2016 OT Individual Time: 0803-0900 OT Individual Time Calculation (min): 57 min    Short Term Goals: Week 1:  OT Short Term Goal 1 (Week 1): Pt will stand at sink with MIN A for grooming 2/2 items to improve endurance OT Short Term Goal 2 (Week 1): Pt will stand to wash buttocks during bathing with touching A OT Short Term Goal 3 (Week 1): Pt will complete tub transfer/shower transfer wiht touching A OT Short Term Goal 4 (Week 1): Pt will complete commode/toilet transfer with touching A  Skilled Therapeutic Interventions/Progress Updates:    OT treatment focused on activity tolerance, functional ambulation, dynamic balance, coordination, and R NMR. Dynamic standing balance and functional use of R UE with standing toothbrushing task at the sink Pt then ambulated to day room without AD and min A + mod verbal cues to avoid obstacles on R side. Dynavision activity with static and dynamic balance tasks with and without UE support. Balance and coordination activity with put put. Pt needed min guard A for balance but was able to swing through. Fine motor coordination with push pin activity and peg board task focused on in-hand manipulation, translation, and rotation. Pt left sated in therapy gym with handoff to PT.   Therapy Documentation Precautions:  Precautions Precautions: Fall Precaution Comments: (R knee and ankle pain.  h/o R ankle gout.  R knee buckles.) Restrictions Weight Bearing Restrictions: No Pain: Pain Assessment Pain Score: 5  Pain Location: Ankle Pain Orientation: Right Pain Descriptors / Indicators: Aching Pain Onset: With Activity Pain Intervention(s): Repositioned  See Function Navigator for Current Functional Status.   Therapy/Group: Individual Therapy  [name] 11/18/2016, 9:04 AM

## 2016-11-19 ENCOUNTER — Inpatient Hospital Stay (HOSPITAL_COMMUNITY): Payer: BLUE CROSS/BLUE SHIELD | Admitting: Speech Pathology

## 2016-11-19 ENCOUNTER — Inpatient Hospital Stay (HOSPITAL_COMMUNITY): Payer: BLUE CROSS/BLUE SHIELD

## 2016-11-19 ENCOUNTER — Ambulatory Visit: Payer: BLUE CROSS/BLUE SHIELD | Admitting: Cardiology

## 2016-11-19 ENCOUNTER — Inpatient Hospital Stay (HOSPITAL_COMMUNITY): Payer: BLUE CROSS/BLUE SHIELD | Admitting: Physical Therapy

## 2016-11-19 ENCOUNTER — Inpatient Hospital Stay (HOSPITAL_COMMUNITY): Payer: BLUE CROSS/BLUE SHIELD | Admitting: Occupational Therapy

## 2016-11-19 LAB — GLUCOSE, CAPILLARY
GLUCOSE-CAPILLARY: 100 mg/dL — AB (ref 65–99)
GLUCOSE-CAPILLARY: 150 mg/dL — AB (ref 65–99)
Glucose-Capillary: 86 mg/dL (ref 65–99)
Glucose-Capillary: 121 mg/dL — ABNORMAL HIGH (ref 65–99)

## 2016-11-19 LAB — CBC
HEMATOCRIT: 40.7 % (ref 39.0–52.0)
Hemoglobin: 14.1 g/dL (ref 13.0–17.0)
MCH: 29.5 pg (ref 26.0–34.0)
MCHC: 34.6 g/dL (ref 30.0–36.0)
MCV: 85.1 fL (ref 78.0–100.0)
PLATELETS: 245 10*3/uL (ref 150–400)
RBC: 4.78 MIL/uL (ref 4.22–5.81)
RDW: 13.3 % (ref 11.5–15.5)
WBC: 7.7 10*3/uL (ref 4.0–10.5)

## 2016-11-19 MED ORDER — AMLODIPINE BESYLATE 2.5 MG PO TABS
2.5000 mg | ORAL_TABLET | Freq: Every day | ORAL | Status: DC
  Administered 2016-11-19 – 2016-11-20 (×2): 2.5 mg via ORAL
  Filled 2016-11-19 (×3): qty 1

## 2016-11-19 NOTE — Progress Notes (Signed)
Physical Therapy Weekly Progress Note  Patient Details  Name: [name] [name] MRN: [id number] Date of Birth: 1955/01/30  Beginning of progress report period: November 13, 2016 End of progress report period: November 19, 2016  Today's Date: 11/19/2016 PT Individual Time: 2091-9802 PT Individual Time Calculation (min): 60 min   Patient has met 3 of 3 short term goals.  Pt has made excellent progress towards LTGs and is currently performing all mobility with supervision, no ambulatory aid required.    Patient continues to demonstrate the following deficits decreased coordination and decreased motor planning, decreased safety awareness and decreased standing balance and decreased balance strategies and therefore will continue to benefit from skilled PT intervention to increase functional independence with mobility.  Patient progressing toward long term goals..  Continue plan of care.  PT Short Term Goals Week 1:  PT Short Term Goal 1 (Week 1): Pt will ambulate 75 ft with LRAD and supervision PT Short Term Goal 1 - Progress (Week 1): Met PT Short Term Goal 2 (Week 1): Pt will transfer bed to chair with supervision and LRAD PT Short Term Goal 2 - Progress (Week 1): Met PT Short Term Goal 3 (Week 1): Pt will perform sit to stand wtih LRAD wtih supervision and without LOB for 5/5 reps PT Short Term Goal 3 - Progress (Week 1): Met Week 2:  PT Short Term Goal 1 (Week 2): =LTGs   Skilled Therapeutic Interventions/Progress Updates:    no c/o pain.  Session focus on high level balance and NMR.  Pt ambulates throughout unit with supervision and no AD.  Transfers throughout session mod I.    //bars for balance: 20 reps mini squats and heel toe raises; ML rocker board focus on attaining/maintaining balance without UE support progress to mini squats and heel raises with out UE support x15 reps; AP rocker board focus on attaining/maintaining balance without UE support progress to minisquats without UE  support x15 reps.    High level balance and NMR for stepping strategy with maxi sky for safety, ambulating forwards/backwards/side stepping with pt demonstrating fair stepping strategy in each direction.    Kinetron x3 trials in standing at 25 cm/s focus on trunk/pelvis stability.  Attempted to trial kinetron without UE support but pt unable to maintain balance.  Returned to room at end of session and positioned in recliner with call bell in reach and needs met.   Therapy Documentation Precautions:  Precautions Precautions: Fall Precaution Comments: (R knee and ankle pain.  h/o R ankle gout.  R knee buckles.) Restrictions Weight Bearing Restrictions: No   See Function Navigator for Current Functional Status.  Therapy/Group: Individual Therapy  Michel Santee 11/19/2016, 4:24 PM

## 2016-11-19 NOTE — Progress Notes (Signed)
Physical Therapy Session Note  Patient Details  Name: [name] [name] MRN: [id number] Date of Birth: 1955/11/20  Today's Date: 11/19/2016 PT Individual Time: [id number] PT Individual Time Calculation (min): 42 min   Short Term Goals: Week 1:  PT Short Term Goal 1 (Week 1): Pt will ambulate 75 ft with LRAD and supervision PT Short Term Goal 2 (Week 1): Pt will transfer bed to chair with supervision and LRAD PT Short Term Goal 3 (Week 1): Pt will perform sit to stand wtih LRAD wtih supervision and without LOB for 5/5 reps  Skilled Therapeutic Interventions/Progress Updates:    Session focused on functional gait on unit with focus on R foot clearance and heel strike (feedback from wearing shoes today to hear foot scuffing as signal to increase effort to R foot clearance). NMR to address postural control and balance re-training via [employer] (random control program with compliant surface level 7 with decreasing UE support to increase challenge and activate ankle strategies - increased difficulty with weight shifting without UE support for balance), fine motor control task for RUE during pill and pill bottle manipulation in standing while bias to focus on RLE control (step under LLE), and dynamic activity to gather and carry various items, stack cones with RUE and place on the floor and pick up from the floor, obstacle negotiation with pt to tap R foot onto cone and then step with RLE over cone (5 reps back and forth with min assist when LOB occurs), and stair negotiation without rails for home entry practice several repetitions with cues for step to pattern for increased safety and min to occasional mod assist due to poor R foot clearance due to increased speed and impaired eccentric control. Pt demonstrates mild impulsivity during mobility with decreased awareness and therefore increased fall risk. Chair alarm intact upon leaving pt room.   Therapy Documentation Precautions:  Precautions Precautions:  Fall Precaution Comments: (R knee and ankle pain.  h/o R ankle gout.  R knee buckles.) Restrictions Weight Bearing Restrictions: No  Pain:  Denies pain.    See Function Navigator for Current Functional Status.   Therapy/Group: Individual Therapy  Karolee StampsGray, Ayane Delancey Darrol PokeBrescia  Cintya Daughety B. Anasha Perfecto, PT, DPT  11/19/2016, 9:23 AM

## 2016-11-19 NOTE — Progress Notes (Signed)
Occupational Therapy Weekly Progress Note  Patient Details  Name: [name] [name] MRN: [id number] Date of Birth: February 16, 1955  Beginning of progress report period: November 13, 2016 End of progress report period: November 19, 2016  Today's Date: 11/19/2016 OT Individual Time: 1000-1100 OT Individual Time Calculation (min): 60 min    Patient has met 4 of 4 short term goals. Pt is making steady progress with OT treatments at this time.  He is able to ambulate in room and min guard assist without AD.  LUE function continues to improve to a supervision level for bathing and dressing tasks.  He continues to need increased time for fastening belt and pants as well as for tying shoes.  Feel he is on target to reach modified independent level goals for discharge home next week.  Will continue with current OT treatment POC.     Patient continues to demonstrate the following deficits: muscle weakness, decreased cardiorespiratoy endurance, impaired timing and sequencing, unbalanced muscle activation and decreased coordination, decreased attention to right and decreased standing balance, hemiplegia and decreased balance strategies and therefore will continue to benefit from skilled OT intervention to enhance overall performance with BADL.  Patient progressing toward long term goals..  Continue plan of care.  OT Short Term Goals Week 1:  OT Short Term Goal 1 (Week 1): Pt will stand at sink with MIN A for grooming 2/2 items to improve endurance OT Short Term Goal 1 - Progress (Week 1): Met OT Short Term Goal 2 (Week 1): Pt will stand to wash buttocks during bathing with touching A OT Short Term Goal 2 - Progress (Week 1): Met OT Short Term Goal 3 (Week 1): Pt will complete tub transfer/shower transfer wiht touching A OT Short Term Goal 3 - Progress (Week 1): Met OT Short Term Goal 4 (Week 1): Pt will complete commode/toilet transfer with touching A OT Short Term Goal 4 - Progress (Week 1): Met Week 2:   OT Short Term Goal 2 (Week 2): STG=LTG 2/2 ELOS  Skilled Therapeutic Interventions/Progress Updates:    OT treatment session focused on modified bathing/dressing, functional balance, R NMR, and R side attention Pt ambulated to bathroom without AD and min guard A. Bathing/dressing completed with overall set-up A and focus on functional use of R UE, R attention to locate items, and standing balance/endurance. Pt ambulated to therapy gym with min guard. R NMR, coordination, core strengthening and weight shifting with quadruped card matching task Functional balance with B UEs holding disk and balancing tennis ball while ambulating in hallway. Pt needed VC for R foot clearance with fatigue. Pt returned to room and left seated in recliner with needs met.  Therapy Documentation Precautions:  Precautions Precautions: Fall Precaution Comments: (R knee and ankle pain.  h/o R ankle gout.  R knee buckles.) Restrictions Weight Bearing Restrictions: No Pain:  none/denies pain  See Function Navigator for Current Functional Status.   Therapy/Group: Individual Therapy  Valma Cava 11/19/2016, 10:24 AM

## 2016-11-19 NOTE — Progress Notes (Signed)
Subjective/Complaints:    Review of systems negative for chest pain shortness of breath nausea vomiting constipation  Objective: Vital Signs: Blood pressure (!) 164/97, pulse (!) 56, temperature 97.8 F (36.6 C), temperature source Oral, resp. rate 18, height 5\' 10"  (1.778 m), weight 107.5 kg (237 lb), SpO2 98 %. No results found. Results for orders placed or performed during the hospital encounter of 11/12/16 (from the past 72 hour(s))  Glucose, capillary     Status: Abnormal   Collection Time: 11/16/16 11:53 AM  Result Value Ref Range   Glucose-Capillary 101 (H) 65 - 99 mg/dL  Glucose, capillary     Status: Abnormal   Collection Time: 11/16/16  4:18 PM  Result Value Ref Range   Glucose-Capillary 131 (H) 65 - 99 mg/dL  Glucose, capillary     Status: Abnormal   Collection Time: 11/16/16  8:58 PM  Result Value Ref Range   Glucose-Capillary 150 (H) 65 - 99 mg/dL  Glucose, capillary     Status: Abnormal   Collection Time: 11/17/16  6:43 AM  Result Value Ref Range   Glucose-Capillary 122 (H) 65 - 99 mg/dL  Glucose, capillary     Status: Abnormal   Collection Time: 11/17/16  8:55 AM  Result Value Ref Range   Glucose-Capillary 150 (H) 65 - 99 mg/dL  Glucose, capillary     Status: Abnormal   Collection Time: 11/17/16 12:21 PM  Result Value Ref Range   Glucose-Capillary 112 (H) 65 - 99 mg/dL  Glucose, capillary     Status: Abnormal   Collection Time: 11/17/16  4:41 PM  Result Value Ref Range   Glucose-Capillary 110 (H) 65 - 99 mg/dL  Glucose, capillary     Status: Abnormal   Collection Time: 11/17/16  9:23 PM  Result Value Ref Range   Glucose-Capillary 181 (H) 65 - 99 mg/dL  Glucose, capillary     Status: Abnormal   Collection Time: 11/18/16  6:20 AM  Result Value Ref Range   Glucose-Capillary 127 (H) 65 - 99 mg/dL  Glucose, capillary     Status: None   Collection Time: 11/18/16 11:35 AM  Result Value Ref Range   Glucose-Capillary 93 65 - 99 mg/dL  Glucose, capillary      Status: None   Collection Time: 11/18/16  4:25 PM  Result Value Ref Range   Glucose-Capillary 99 65 - 99 mg/dL  Glucose, capillary     Status: Abnormal   Collection Time: 11/18/16  9:07 PM  Result Value Ref Range   Glucose-Capillary 123 (H) 65 - 99 mg/dL  CBC     Status: None   Collection Time: 11/19/16  5:06 AM  Result Value Ref Range   WBC 7.7 4.0 - 10.5 K/uL   RBC 4.78 4.22 - 5.81 MIL/uL   Hemoglobin 14.1 13.0 - 17.0 g/dL   HCT 86.540.7 78.439.0 - 69.652.0 %   MCV 85.1 78.0 - 100.0 fL   MCH 29.5 26.0 - 34.0 pg   MCHC 34.6 30.0 - 36.0 g/dL   RDW 29.513.3 28.411.5 - 13.215.5 %   Platelets 245 150 - 400 K/uL  Glucose, capillary     Status: Abnormal   Collection Time: 11/19/16  5:53 AM  Result Value Ref Range   Glucose-Capillary 100 (H) 65 - 99 mg/dL     HEENT: normal Cardio: RRR and no murmur Resp: CTA B/L and unlabored GI: BS positive and Non distended nontender Extremity:  No Edema Skin:   Erythema Right foot/ankle resolved Neuro: Alert/Oriented,  Cranial Nerve Abnormalities Right CNVII, Abnormal Motor 3/5 right deltoid, bicep, tricep, finger flexors, 4 at the right hip flexor knee extensor 3- at the ankle plantar flexor 2- at the ankle dorsiflexor. and Dysarthric Musc/Skel:  Min tenderness over the right ankle  Has full range of motion.  No evidence of knee effusion.  No erythema.  There is mild tenderness over the patella, no significant tenderness over the quad tendon or the patellar tendon. General no acute distress   Assessment/Plan: 1. Functional deficits secondary to right hemiparesis secondary to left IC posterior limb infarct which require 3+ hours per day of interdisciplinary therapy in a comprehensive inpatient rehab setting. Physiatrist is providing close team supervision and 24 hour management of active medical problems listed below. Physiatrist and rehab team continue to assess barriers to discharge/monitor patient progress toward functional and medical goals. FIM: Function -  Bathing Position: Shower Body parts bathed by patient: Right arm, Left arm, Chest, Abdomen, Front perineal area, Buttocks, Right lower leg, Left lower leg, Left upper leg, Right upper leg Body parts bathed by helper: Back Assist Level: Supervision or verbal cues  Function- Upper Body Dressing/Undressing What is the patient wearing?: Pull over shirt/dress Pull over shirt/dress - Perfomed by patient: Thread/unthread right sleeve, Thread/unthread left sleeve, Put head through opening, Pull shirt over trunk Assist Level: Set up Set up : To obtain clothing/put away Function - Lower Body Dressing/Undressing Position: Wheelchair/chair at sink Assist for footwear: Supervision/touching assist Assist for lower body dressing: Touching or steadying assistance (Pt > 75%)  Function - Toileting Toileting steps completed by patient: Performs perineal hygiene, Adjust clothing prior to toileting, Adjust clothing after toileting Toileting steps completed by helper: Adjust clothing prior to toileting, Performs perineal hygiene, Adjust clothing after toileting Toileting Assistive Devices: Grab bar or rail Assist level: Touching or steadying assistance (Pt.75%)  Function - Toilet Transfers Toilet transfer activity did not occur: Refused Toilet transfer assistive device: Grab bar Assist level to toilet: Supervision or verbal cues Assist level from toilet: Supervision or verbal cues Assist level to bedside commode (at bedside): 2 helpers(per Rona Ravensiera Craven, NT report) Assist level from bedside commode (at bedside): Moderate assist (Pt 50 - 74%/lift or lower)  Function - Chair/bed transfer Chair/bed transfer method: Ambulatory Chair/bed transfer assist level: Supervision or verbal cues Chair/bed transfer assistive device: Armrests, Walker Chair/bed transfer details: Verbal cues for precautions/safety  Function - Locomotion: Wheelchair Will patient use wheelchair at discharge?: No Max wheelchair distance:  (75 ft) Assist Level: Touching or steadying assistance (Pt > 75%) Assist Level: Touching or steadying assistance (Pt > 75%) Wheel 150 feet activity did not occur: Safety/medical concerns(Fatigue) Turns around,maneuvers to table,bed, and toilet,negotiates 3% grade,maneuvers on rugs and over doorsills: No Function - Locomotion: Ambulation Assistive device: No device Max distance: 33500ft  Assist level: Supervision or verbal cues Assist level: Supervision or verbal cues Walk 50 feet with 2 turns activity did not occur: Safety/medical concerns(R ankle pain/R knee weakness) Assist level: Supervision or verbal cues Walk 150 feet activity did not occur: Safety/medical concerns Assist level: Touching or steadying assistance (Pt > 75%) Walk 10 feet on uneven surfaces activity did not occur: Safety/medical concerns  Function - Comprehension Comprehension: Auditory Comprehension assist level: Follows basic conversation/direction with extra time/assistive device  Function - Expression Expression: Verbal Expression assist level: Expresses basic needs/ideas: With no assist  Function - Social Interaction Social Interaction assist level: Interacts appropriately with others with medication or extra time (anti-anxiety, antidepressant).  Function - Problem Solving Problem solving  assist level: Solves complex 90% of the time/cues < 10% of the time  Function - Memory Memory assist level: Recognizes or recalls 90% of the time/requires cueing < 10% of the time Patient normally able to recall (first 3 days only): Current season, Location of own room, Staff names and faces  Medical Problem List and Plan: 1.  Right  facial weakness, Right sided weakness with balance deficits secondary to left internal capsule CVA with history of CVA. Continue CIR PT OT 2.  DVT Prophylaxis/Anticoagulation: Pharmaceutical: Lovenox 3. Pain Management: will order tramadol and/or tylenol prn 4. Mood: LCSW to follow for  evaluation and support.  5. Neuropsych: This patient is capable of making decisions on his own behalf. 6. Skin/Wound Care: routine pressure relief measures.  7. Fluids/Electrolytes/Nutrition: Maintain adequate nutrition/hydration. 8. Right knee pain:Appears to be in patellar area Post fall in hospital--X rays reported to be negative. Local measures. Start voltaren gel 9. HTN: Monitor BP bid--continue metoprolol and lisinopril. Permissive HTN to prevent hypoperfusion.   Vitals:   11/18/16 1552 11/19/16 0600  BP: (!) 168/96 (!) 164/97  Pulse: 62 (!) 56  Resp: 18 18  Temp: 98.3 F (36.8 C) 97.8 F (36.6 C)  SpO2: 96% 98%  elevated 11/18/2016- cannot increase BB due to brady will  Increase zestril     , will add amlodipine 2.5mg                                                                                                                    10. CKD?: baseline SCr- 1.4. Stable at 1.44,11/13 11. New diagnosis T2DM: Monitor BS ac/hs. Will start low dose amaryl and titrate as indicated. Will use SSI for elevated BS and tighter control. Consult dietitian for dietary education.  CBG (last 3)  Recent Labs    11/18/16 1625 11/18/16 2107 11/19/16 0553  GLUCAP 99 123* 100*  Controlled 11/16,   12. Fluid overload/Chest pain: Has seen cardiology as an outpatient in the past.  Chest x-ray improved after Lasix.Ej fx 65% 13.  Shortness of breath uses albuterol at home presumed asthma versus COPD. 14.  Gout flareup, unresponsive to colchicine.  tapering Medrol Dosepak improving, resume allopurinol when flare is resolved  LOS (Days) 7 A FACE TO FACE EVALUATION WAS PERFORMED  Erick Colace 11/19/2016, 7:24 AM

## 2016-11-19 NOTE — Progress Notes (Signed)
Social Work Patient ID: Fortunato CurlingJon M Duprey, male   DOB: 1955-09-28, 61 y.o.   MRN: [id number]  Clinical update called into Pam-RN [employer] and she informed worker pt was approved until 11/18. Asked to go ahead and fax in clinical; update. Made aware target discharge date 11/21. Talk with Pam on Monday.

## 2016-11-19 NOTE — Progress Notes (Signed)
Social Work Patient ID: [name] [name], male   DOB: 08/03/1955, 61 y.o.   MRN: [id number]  Gave pt the STD forms MD has completed and faxed in. Pt will give to his wife when here tonight.

## 2016-11-19 NOTE — Progress Notes (Signed)
Speech Language Pathology Session Note & Discharge Summary  Patient Details  Name: [name] [name] MRN: [id number] Date of Birth: 1955-07-30  Today's Date: 11/19/2016 SLP Individual Time: 1315-1350 SLP Individual Time Calculation (min): 35 min   Skilled Therapeutic Interventions:  Skilled treatment session focused on cognitive goals. SLP facilitated session by re-administering the MoCA (version 7.3). Patient scored 24/30 points with a score of 26 or above considered normal. Patient demonstrated deficits in short-term recall, however, patient is able to recall functional and relevant information pertaining to his care with Mod I. Patient reports baseline memory impairments since ~2 years ago. Patient educated on memory compensatory strategies and utilization at home to maximize recall and carryover. He verbalized understanding and a handout was given to reinforce information.  Suspect patient is at his baseline level of cognitive-linguistic function, therefore, he will be discharged from skilled SLP intervention. Patient verbalized understanding and agreement. Patient left upright in recliner with all needs within reach.   Patient has met 4 of 4 long term goals.  Patient to discharge at overall Modified Independent level.   Reasons goals not met: N/A   Clinical Impression/Discharge Summary: Patient has made excellent gains and has met 4 of 4 LTG's this admission. Currently, patient is Mod I for complex problem solving and recall of functional information with use of compensatory strategies. Patient is also Mod I for word-finding and use of speech intelligibility strategies at the conversation level. Patient is currently consuming regular textures with thin liquids without overt s/s of aspiration with Mod I. Suspect patient is at his baseline level of cognitive-linguistic function, therefore, he will be discharged from skilled SLP intervention with f/u not warranted at this time.   Recommendation:   None      Equipment: N/A   Reasons for discharge: Treatment goals met   Patient/Family Agrees with Progress Made and Goals Achieved: Yes   Function:   Cognition Comprehension Comprehension assist level: Follows basic conversation/direction with extra time/assistive device  Expression   Expression assist level: Expresses basic needs/ideas: With no assist  Social Interaction Social Interaction assist level: Interacts appropriately with others with medication or extra time (anti-anxiety, antidepressant).  Problem Solving Problem solving assist level: Solves complex problems: With extra time  Memory Memory assist level: Assistive device: No helper   Perez Dirico 11/19/2016, 2:03 PM

## 2016-11-19 NOTE — Plan of Care (Signed)
Pt ambulated with writer to cafeteria and back without difficulty. Pt is continent of bowel and bladder. Pt denies any discomfort states gout is improving.

## 2016-11-19 NOTE — Progress Notes (Signed)
Nutrition Brief Note:  Consult received for diet education for pt with new DM.  RD provided diet education for DM after admission on 11/10.  Follow up today for additional questions. Pt acknowledges that he will need to do more meal planning when he discharges home. He plans to eat out less and make better decisions when he does go out to eat.  No further questions at this time. Pt has handouts for home.  Please re-consult as needed.   Kendell BaneHeather Camala Talwar RD, LDN, CNSC [phone] Pager [phone] After Hours Pager

## 2016-11-19 NOTE — Plan of Care (Signed)
Pt denies any need for analgesics. Gout is improving in feet

## 2016-11-19 NOTE — Progress Notes (Signed)
Patient confirms he has a glucometer at home and he knows how to check his blood sugar.

## 2016-11-19 NOTE — Progress Notes (Signed)
Pt A/O, no noted distress. Denies pain. Rt foot edema encourage to let staff elevate foot at the time he refuses. Diabetic educator called she noted she will put in a diet consult. If any question shall arise please notify diabetic educator by paging [phone]. Staff will continue to monitor and meet needs.

## 2016-11-20 ENCOUNTER — Encounter (HOSPITAL_COMMUNITY): Payer: Self-pay | Admitting: Emergency Medicine

## 2016-11-20 ENCOUNTER — Inpatient Hospital Stay (HOSPITAL_COMMUNITY): Payer: BLUE CROSS/BLUE SHIELD | Admitting: Occupational Therapy

## 2016-11-20 DIAGNOSIS — M10061 Idiopathic gout, right knee: Secondary | ICD-10-CM

## 2016-11-20 LAB — GLUCOSE, CAPILLARY
GLUCOSE-CAPILLARY: 102 mg/dL — AB (ref 65–99)
Glucose-Capillary: 107 mg/dL — ABNORMAL HIGH (ref 65–99)
Glucose-Capillary: 88 mg/dL (ref 65–99)
Glucose-Capillary: 117 mg/dL — ABNORMAL HIGH (ref 65–99)

## 2016-11-20 NOTE — Progress Notes (Signed)
Subjective/Complaints: Patient seen sitting up in his chair this morning. He states he slept well overnight.   Review of systems: Denies chest pain shortness of breath nausea vomiting constipation  Objective: Vital Signs: Blood pressure (!) 155/83, pulse 64, temperature 97.7 F (36.5 C), temperature source Oral, resp. rate 16, height 5\' 10"  (1.778 m), weight 107.5 kg (237 lb), SpO2 98 %. No results found. Results for orders placed or performed during the hospital encounter of 11/12/16 (from the past 72 hour(s))  Glucose, capillary     Status: Abnormal   Collection Time: 11/17/16  8:55 AM  Result Value Ref Range   Glucose-Capillary 150 (H) 65 - 99 mg/dL  Glucose, capillary     Status: Abnormal   Collection Time: 11/17/16 12:21 PM  Result Value Ref Range   Glucose-Capillary 112 (H) 65 - 99 mg/dL  Glucose, capillary     Status: Abnormal   Collection Time: 11/17/16  4:41 PM  Result Value Ref Range   Glucose-Capillary 110 (H) 65 - 99 mg/dL  Glucose, capillary     Status: Abnormal   Collection Time: 11/17/16  9:23 PM  Result Value Ref Range   Glucose-Capillary 181 (H) 65 - 99 mg/dL  Glucose, capillary     Status: Abnormal   Collection Time: 11/18/16  6:20 AM  Result Value Ref Range   Glucose-Capillary 127 (H) 65 - 99 mg/dL  Glucose, capillary     Status: None   Collection Time: 11/18/16 11:35 AM  Result Value Ref Range   Glucose-Capillary 93 65 - 99 mg/dL  Glucose, capillary     Status: None   Collection Time: 11/18/16  4:25 PM  Result Value Ref Range   Glucose-Capillary 99 65 - 99 mg/dL  Glucose, capillary     Status: Abnormal   Collection Time: 11/18/16  9:07 PM  Result Value Ref Range   Glucose-Capillary 123 (H) 65 - 99 mg/dL  CBC     Status: None   Collection Time: 11/19/16  5:06 AM  Result Value Ref Range   WBC 7.7 4.0 - 10.5 K/uL   RBC 4.78 4.22 - 5.81 MIL/uL   Hemoglobin 14.1 13.0 - 17.0 g/dL   HCT 16.140.7 09.639.0 - 04.552.0 %   MCV 85.1 78.0 - 100.0 fL   MCH 29.5 26.0 -  34.0 pg   MCHC 34.6 30.0 - 36.0 g/dL   RDW 40.913.3 81.111.5 - 91.415.5 %   Platelets 245 150 - 400 K/uL  Glucose, capillary     Status: Abnormal   Collection Time: 11/19/16  5:53 AM  Result Value Ref Range   Glucose-Capillary 100 (H) 65 - 99 mg/dL  Glucose, capillary     Status: None   Collection Time: 11/19/16 11:35 AM  Result Value Ref Range   Glucose-Capillary 86 65 - 99 mg/dL  Glucose, capillary     Status: Abnormal   Collection Time: 11/19/16  4:28 PM  Result Value Ref Range   Glucose-Capillary 121 (H) 65 - 99 mg/dL  Glucose, capillary     Status: Abnormal   Collection Time: 11/19/16  9:07 PM  Result Value Ref Range   Glucose-Capillary 150 (H) 65 - 99 mg/dL  Glucose, capillary     Status: Abnormal   Collection Time: 11/20/16  6:01 AM  Result Value Ref Range   Glucose-Capillary 107 (H) 65 - 99 mg/dL     HEENT: Normocephalic. Atraumatic. Cardio: RRR and no JVD Resp: CTA B/L and unlabored GI: BS positive and Non distended  Skin:  Intact. Warm and dry. Neuro: Alert/Oriented,  Motor 4+/5 right deltoid, bicep, tricep, finger flexors, 4+/5 at the right hip flexor knee extensor 4-/5 at the ankle dorsi/plantar flexors Dysarthria Musc/Skel:  No edema. Mild tenderness at right knee General no acute distress. Well-developed.   Assessment/Plan: 1. Functional deficits secondary to right hemiparesis secondary to left IC posterior limb infarct which require 3+ hours per day of interdisciplinary therapy in a comprehensive inpatient rehab setting. Physiatrist is providing close team supervision and 24 hour management of active medical problems listed below. Physiatrist and rehab team continue to assess barriers to discharge/monitor patient progress toward functional and medical goals. FIM: Function - Bathing Position: Shower Body parts bathed by patient: Right arm, Left arm, Chest, Abdomen, Front perineal area, Buttocks, Right lower leg, Left lower leg, Left upper leg, Right upper leg Body  parts bathed by helper: Back Assist Level: Supervision or verbal cues  Function- Upper Body Dressing/Undressing What is the patient wearing?: Pull over shirt/dress Pull over shirt/dress - Perfomed by patient: Thread/unthread right sleeve, Thread/unthread left sleeve, Put head through opening, Pull shirt over trunk Assist Level: Set up Set up : To obtain clothing/put away Function - Lower Body Dressing/Undressing What is the patient wearing?: Shoes Position: Wheelchair/chair at sink Shoes - Performed by patient: Don/doff right shoe, Don/doff left shoe, Fasten right, Fasten left Assist for footwear: Independent Assist for lower body dressing: Touching or steadying assistance (Pt > 75%)  Function - Toileting Toileting steps completed by patient: Performs perineal hygiene, Adjust clothing prior to toileting, Adjust clothing after toileting Toileting steps completed by helper: Adjust clothing prior to toileting, Performs perineal hygiene, Adjust clothing after toileting Toileting Assistive Devices: Grab bar or rail Assist level: Touching or steadying assistance (Pt.75%)  Function - Archivist transfer activity did not occur: Refused Toilet transfer assistive device: Grab bar Assist level to toilet: Supervision or verbal cues Assist level from toilet: Supervision or verbal cues Assist level to bedside commode (at bedside): 2 helpers(per Rona Ravens, NT report) Assist level from bedside commode (at bedside): Moderate assist (Pt 50 - 74%/lift or lower)  Function - Chair/bed transfer Chair/bed transfer method: Ambulatory Chair/bed transfer assist level: Supervision or verbal cues Chair/bed transfer assistive device: Armrests Chair/bed transfer details: Verbal cues for safe use of DME/AE  Function - Locomotion: Wheelchair Will patient use wheelchair at discharge?: No Max wheelchair distance: (75 ft) Assist Level: Touching or steadying assistance (Pt > 75%) Assist Level:  Touching or steadying assistance (Pt > 75%) Wheel 150 feet activity did not occur: Safety/medical concerns(Fatigue) Turns around,maneuvers to table,bed, and toilet,negotiates 3% grade,maneuvers on rugs and over doorsills: No Function - Locomotion: Ambulation Assistive device: No device Max distance: 300' Assist level: Supervision or verbal cues Assist level: Supervision or verbal cues Walk 50 feet with 2 turns activity did not occur: Safety/medical concerns(R ankle pain/R knee weakness) Assist level: Supervision or verbal cues Walk 150 feet activity did not occur: Safety/medical concerns Assist level: Supervision or verbal cues Walk 10 feet on uneven surfaces activity did not occur: Safety/medical concerns  Function - Comprehension Comprehension: Auditory Comprehension assist level: Follows basic conversation/direction with extra time/assistive device  Function - Expression Expression: Verbal Expression assist level: Expresses basic needs/ideas: With no assist  Function - Social Interaction Social Interaction assist level: Interacts appropriately with others with medication or extra time (anti-anxiety, antidepressant).  Function - Problem Solving Problem solving assist level: Solves complex problems: With extra time  Function - Memory Memory assist level: Assistive device: No helper  Patient normally able to recall (first 3 days only): Current season, Location of own room, Staff names and faces, That he or she is in a hospital  Medical Problem List and Plan: 1.  Right  facial weakness, Right sided weakness with balance deficits secondary to left internal capsule CVA with history of CVA.   Continue CIR  2.  DVT Prophylaxis/Anticoagulation: Pharmaceutical: Lovenox 3. Pain Management: will order tramadol and/or tylenol prn 4. Mood: LCSW to follow for evaluation and support.  5. Neuropsych: This patient is capable of making decisions on his own behalf. 6. Skin/Wound Care: routine  pressure relief measures.  7. Fluids/Electrolytes/Nutrition: Maintain adequate nutrition/hydration. 8. Right knee pain:Appears to be in patellar area Post fall in hospital--X rays reported to be negative. Local measures.    Voltaren gel 9. HTN: Monitor BP bid--continue metoprolol and lisinopril.  Vitals:   11/19/16 1606 11/20/16 0510  BP: (!) 110/58 (!) 155/83  Pulse: 72 64  Resp: 18 16  Temp: 98.5 F (36.9 C) 97.7 F (36.5 C)  SpO2: 100% 98%    Increased zestril   Added amlodipine 2.5mg  on 11/16   Will consider further increase tomorrow 10. CKD?: baseline SCr- 1.4.    Creatinine 1. 44 on 11/13   Labs ordered for Monday 11. New diagnosis T2DM: Monitor BS ac/hs. Will start low dose amaryl and titrate as indicated. Will use SSI for elevated BS and tighter control. Consult dietitian for dietary education.  CBG (last 3)  Recent Labs    11/19/16 1628 11/19/16 2107 11/20/16 0601  GLUCAP 121* 150* 107*    Relatively controlled on 11/17 12. Fluid overload/Chest pain: Has seen cardiology as an outpatient in the past.  Chest x-ray improved after Lasix.Ej fx 65% 13.  Shortness of breath uses albuterol at home presumed asthma versus COPD. 14.  Gout flareup, unresponsive to colchicine.  tapering Medrol Dosepak improving, resume allopurinol when flare is resolved  LOS (Days) 8 A FACE TO FACE EVALUATION WAS PERFORMED  Ankit Karis Jubanil Patel 11/20/2016, 7:30 AM

## 2016-11-20 NOTE — Progress Notes (Signed)
Occupational Therapy Session Note  Patient Details  Name: [name] [name] MRN: [id number] Date of Birth: June 22, 1955  Today's Date: 11/20/2016 OT Individual Time: 1445-1530 OT Individual Time Calculation (min): 45 min    Short Term Goals: Week 2:  OT Short Term Goal 2 (Week 2): STG=LTG 2/2 ELOS  Skilled Therapeutic Interventions/Progress Updates:    OT treatment session focused on functional ambulation, balance, and R fine motor coordination. Pt's significant other present with their 2 small dogs. Pt ambulated while holding leash of one dog to walk pt's sig out os hospital. Pt needed min cues to clear R foot when walking. Provided pt with home fin emotor program and theraputty exercise handouts, then worked on fine motor control with handwriting tasks focused on wrist flexion and increased smoothness. Pt left seated in recliner with needs met.   Therapy Documentation Precautions:  Precautions Precautions: Fall Precaution Comments: (R knee and ankle pain.  h/o R ankle gout.  R knee buckles.) Restrictions Weight Bearing Restrictions: No  See Function Navigator for Current Functional Status.   Therapy/Group: Individual Therapy  Valma Cava 11/20/2016, 3:31 PM

## 2016-11-21 DIAGNOSIS — I169 Hypertensive crisis, unspecified: Secondary | ICD-10-CM

## 2016-11-21 LAB — GLUCOSE, CAPILLARY
GLUCOSE-CAPILLARY: 106 mg/dL — AB (ref 65–99)
GLUCOSE-CAPILLARY: 131 mg/dL — AB (ref 65–99)
Glucose-Capillary: 101 mg/dL — ABNORMAL HIGH (ref 65–99)
Glucose-Capillary: 126 mg/dL — ABNORMAL HIGH (ref 65–99)

## 2016-11-21 MED ORDER — AMLODIPINE BESYLATE 5 MG PO TABS
7.5000 mg | ORAL_TABLET | Freq: Every day | ORAL | Status: DC
  Administered 2016-11-21 – 2016-11-22 (×2): 7.5 mg via ORAL
  Filled 2016-11-21 (×2): qty 1

## 2016-11-21 NOTE — Progress Notes (Signed)
Subjective/Complaints: Pt seen sitting up in his chair AM.  He slept well overnight.  He is looking forward to a day of rest today.   Review of systems: Denies chest pain shortness of breath nausea vomiting constipation  Objective: Vital Signs: Blood pressure (!) 163/97, pulse 61, temperature 97.6 F (36.4 C), temperature source Oral, resp. rate 17, height 5\' 10"  (1.778 m), weight 107.5 kg (237 lb), SpO2 100 %. No results found. Results for orders placed or performed during the hospital encounter of 11/12/16 (from the past 72 hour(s))  Glucose, capillary     Status: None   Collection Time: 11/18/16 11:35 AM  Result Value Ref Range   Glucose-Capillary 93 65 - 99 mg/dL  Glucose, capillary     Status: None   Collection Time: 11/18/16  4:25 PM  Result Value Ref Range   Glucose-Capillary 99 65 - 99 mg/dL  Glucose, capillary     Status: Abnormal   Collection Time: 11/18/16  9:07 PM  Result Value Ref Range   Glucose-Capillary 123 (H) 65 - 99 mg/dL  CBC     Status: None   Collection Time: 11/19/16  5:06 AM  Result Value Ref Range   WBC 7.7 4.0 - 10.5 K/uL   RBC 4.78 4.22 - 5.81 MIL/uL   Hemoglobin 14.1 13.0 - 17.0 g/dL   HCT 95.640.7 21.339.0 - 08.652.0 %   MCV 85.1 78.0 - 100.0 fL   MCH 29.5 26.0 - 34.0 pg   MCHC 34.6 30.0 - 36.0 g/dL   RDW 57.813.3 46.911.5 - 62.915.5 %   Platelets 245 150 - 400 K/uL  Glucose, capillary     Status: Abnormal   Collection Time: 11/19/16  5:53 AM  Result Value Ref Range   Glucose-Capillary 100 (H) 65 - 99 mg/dL  Glucose, capillary     Status: None   Collection Time: 11/19/16 11:35 AM  Result Value Ref Range   Glucose-Capillary 86 65 - 99 mg/dL  Glucose, capillary     Status: Abnormal   Collection Time: 11/19/16  4:28 PM  Result Value Ref Range   Glucose-Capillary 121 (H) 65 - 99 mg/dL  Glucose, capillary     Status: Abnormal   Collection Time: 11/19/16  9:07 PM  Result Value Ref Range   Glucose-Capillary 150 (H) 65 - 99 mg/dL  Glucose, capillary     Status:  Abnormal   Collection Time: 11/20/16  6:01 AM  Result Value Ref Range   Glucose-Capillary 107 (H) 65 - 99 mg/dL  Glucose, capillary     Status: None   Collection Time: 11/20/16 12:01 PM  Result Value Ref Range   Glucose-Capillary 88 65 - 99 mg/dL   Comment 1 Notify RN   Glucose, capillary     Status: Abnormal   Collection Time: 11/20/16  4:09 PM  Result Value Ref Range   Glucose-Capillary 102 (H) 65 - 99 mg/dL   Comment 1 Notify RN   Glucose, capillary     Status: Abnormal   Collection Time: 11/20/16  8:54 PM  Result Value Ref Range   Glucose-Capillary 117 (H) 65 - 99 mg/dL   Comment 1 Notify RN   Glucose, capillary     Status: Abnormal   Collection Time: 11/21/16  6:47 AM  Result Value Ref Range   Glucose-Capillary 101 (H) 65 - 99 mg/dL   Comment 1 Notify RN      HEENT: Normocephalic. Atraumatic. Cardio: RRR and no JVD Resp: CTA B/L and unlabored GI: BS positive  and Non distended  Skin:   Intact. Warm and dry. Neuro: Alert/Oriented,  Motor 4+/5 right deltoid, bicep, tricep, finger flexors, 4+/5 at the right hip flexor knee extensor 4-/5 at the ankle dorsi/plantar flexors (stable) Dysarthria Musc/Skel:  No edema. Mild tenderness at right knee (improving) General no acute distress. Well-developed.   Assessment/Plan: 1. Functional deficits secondary to right hemiparesis secondary to left IC posterior limb infarct which require 3+ hours per day of interdisciplinary therapy in a comprehensive inpatient rehab setting. Physiatrist is providing close team supervision and 24 hour management of active medical problems listed below. Physiatrist and rehab team continue to assess barriers to discharge/monitor patient progress toward functional and medical goals. FIM: Function - Bathing Position: Shower Body parts bathed by patient: Right arm, Left arm, Chest, Abdomen, Front perineal area, Buttocks, Right lower leg, Left lower leg, Left upper leg, Right upper leg Body parts bathed by  helper: Back Assist Level: Supervision or verbal cues  Function- Upper Body Dressing/Undressing What is the patient wearing?: Pull over shirt/dress Pull over shirt/dress - Perfomed by patient: Thread/unthread right sleeve, Thread/unthread left sleeve, Put head through opening, Pull shirt over trunk Assist Level: Set up Set up : To obtain clothing/put away Function - Lower Body Dressing/Undressing What is the patient wearing?: Shoes Position: Wheelchair/chair at sink Shoes - Performed by patient: Don/doff right shoe, Don/doff left shoe, Fasten right, Fasten left Assist for footwear: Independent Assist for lower body dressing: Touching or steadying assistance (Pt > 75%)  Function - Toileting Toileting steps completed by patient: Adjust clothing prior to toileting, Performs perineal hygiene, Adjust clothing after toileting Toileting steps completed by helper: Adjust clothing prior to toileting, Performs perineal hygiene, Adjust clothing after toileting Toileting Assistive Devices: Grab bar or rail Assist level: Supervision or verbal cues, More than reasonable time  Function - Archivist transfer activity did not occur: Refused Toilet transfer assistive device: Grab bar Assist level to toilet: Supervision or verbal cues Assist level from toilet: Supervision or verbal cues Assist level to bedside commode (at bedside): 2 helpers(per Rona Ravens, NT report) Assist level from bedside commode (at bedside): Moderate assist (Pt 50 - 74%/lift or lower)  Function - Chair/bed transfer Chair/bed transfer method: Ambulatory Chair/bed transfer assist level: Supervision or verbal cues Chair/bed transfer assistive device: Armrests Chair/bed transfer details: Verbal cues for safe use of DME/AE  Function - Locomotion: Wheelchair Will patient use wheelchair at discharge?: No Max wheelchair distance: (75 ft) Assist Level: Touching or steadying assistance (Pt > 75%) Assist Level:  Touching or steadying assistance (Pt > 75%) Wheel 150 feet activity did not occur: Safety/medical concerns(Fatigue) Turns around,maneuvers to table,bed, and toilet,negotiates 3% grade,maneuvers on rugs and over doorsills: No Function - Locomotion: Ambulation Assistive device: No device Max distance: 300' Assist level: Supervision or verbal cues Assist level: Supervision or verbal cues Walk 50 feet with 2 turns activity did not occur: Safety/medical concerns(R ankle pain/R knee weakness) Assist level: Supervision or verbal cues Walk 150 feet activity did not occur: Safety/medical concerns Assist level: Supervision or verbal cues Walk 10 feet on uneven surfaces activity did not occur: Safety/medical concerns  Function - Comprehension Comprehension: Auditory Comprehension assist level: Follows basic conversation/direction with extra time/assistive device  Function - Expression Expression: Verbal Expression assist level: Expresses basic needs/ideas: With no assist  Function - Social Interaction Social Interaction assist level: Interacts appropriately with others with medication or extra time (anti-anxiety, antidepressant).  Function - Problem Solving Problem solving assist level: Solves complex problems: With extra  time  Function - Memory Memory assist level: Assistive device: No helper Patient normally able to recall (first 3 days only): Current season, Location of own room, Staff names and faces, That he or she is in a hospital  Medical Problem List and Plan: 1.  Right  facial weakness, Right sided weakness with balance deficits secondary to left internal capsule CVA with history of CVA.   Continue CIR  2.  DVT Prophylaxis/Anticoagulation: Pharmaceutical: Lovenox 3. Pain Management: will order tramadol and/or tylenol prn 4. Mood: LCSW to follow for evaluation and support.  5. Neuropsych: This patient is capable of making decisions on his own behalf. 6. Skin/Wound Care: routine  pressure relief measures.  7. Fluids/Electrolytes/Nutrition: Maintain adequate nutrition/hydration. 8. Right knee pain:Appears to be in patellar area Post fall in hospital--X rays reported to be negative. Local measures.    Voltaren gel 9. HTN: Monitor BP bid--continue metoprolol and lisinopril.  Vitals:   11/20/16 1825 11/21/16 0500  BP: (!) 179/95 (!) 163/97  Pulse: 71 61  Resp: 18 17  Temp: 98.5 F (36.9 C) 97.6 F (36.4 C)  SpO2: 98% 100%    Increased zestril   Added amlodipine 2.5mg  on 11/16, increased to 7.5 on 11/18   Hypertensive crisis yestday 10. CKD?: baseline SCr- 1.4.    Creatinine 1. 44 on 11/13   Labs ordered for tomorrow 11. New diagnosis T2DM: Monitor BS ac/hs. Will start low dose amaryl and titrate as indicated. Will use SSI for elevated BS and tighter control. Consult dietitian for dietary education.  CBG (last 3)  Recent Labs    11/20/16 1609 11/20/16 2054 11/21/16 0647  GLUCAP 102* 117* 101*    Relatively controlled on 11/18 12. Fluid overload/Chest pain: Has seen cardiology as an outpatient in the past.  Chest x-ray improved after Lasix.Ej fx 65% 13.  Shortness of breath uses albuterol at home presumed asthma versus COPD. 14.  Gout flareup, unresponsive to colchicine.  tapering Medrol Dosepak improving, resume allopurinol when flare is resolved  LOS (Days) 9 A FACE TO FACE EVALUATION WAS PERFORMED  Ankit Karis JubaAnil Patel 11/21/2016, 7:25 AM

## 2016-11-22 ENCOUNTER — Inpatient Hospital Stay (HOSPITAL_COMMUNITY): Payer: BLUE CROSS/BLUE SHIELD | Admitting: Physical Therapy

## 2016-11-22 ENCOUNTER — Encounter (HOSPITAL_COMMUNITY): Payer: BLUE CROSS/BLUE SHIELD | Admitting: Psychology

## 2016-11-22 ENCOUNTER — Inpatient Hospital Stay (HOSPITAL_COMMUNITY): Payer: BLUE CROSS/BLUE SHIELD | Admitting: Occupational Therapy

## 2016-11-22 ENCOUNTER — Encounter (HOSPITAL_COMMUNITY): Payer: BLUE CROSS/BLUE SHIELD | Admitting: Occupational Therapy

## 2016-11-22 DIAGNOSIS — F411 Generalized anxiety disorder: Secondary | ICD-10-CM

## 2016-11-22 LAB — BASIC METABOLIC PANEL
Anion gap: 6 (ref 5–15)
BUN: 29 mg/dL — AB (ref 6–20)
CALCIUM: 9.2 mg/dL (ref 8.9–10.3)
CO2: 28 mmol/L (ref 22–32)
Chloride: 105 mmol/L (ref 101–111)
Creatinine, Ser: 1.53 mg/dL — ABNORMAL HIGH (ref 0.61–1.24)
GFR calc Af Amer: 56 mL/min — ABNORMAL LOW (ref >60)
GFR, EST NON AFRICAN AMERICAN: 48 mL/min — AB (ref >60)
Glucose, Bld: 100 mg/dL — ABNORMAL HIGH (ref 65–99)
POTASSIUM: 4.8 mmol/L (ref 3.5–5.1)
SODIUM: 139 mmol/L (ref 135–145)

## 2016-11-22 LAB — GLUCOSE, CAPILLARY
GLUCOSE-CAPILLARY: 114 mg/dL — AB (ref 65–99)
GLUCOSE-CAPILLARY: 82 mg/dL (ref 65–99)
Glucose-Capillary: 131 mg/dL — ABNORMAL HIGH (ref 65–99)
Glucose-Capillary: 116 mg/dL — ABNORMAL HIGH (ref 65–99)

## 2016-11-22 MED ORDER — ALLOPURINOL 300 MG PO TABS
300.0000 mg | ORAL_TABLET | Freq: Every day | ORAL | Status: DC
  Administered 2016-11-22 – 2016-11-23 (×2): 300 mg via ORAL
  Filled 2016-11-22 (×2): qty 1

## 2016-11-22 NOTE — Progress Notes (Signed)
Occupational Therapy Session Note  Patient Details  Name: [name] CurlingJon M [name] MRN: [id number] Date of Birth: [birth year]  Today's Date: 11/22/2016 OT Individual Time: [id number] OT Individual Time Calculation (min): 61 min   Short Term Goals: Week 2:  OT Short Term Goal 2 (Week 2): STG=LTG 2/2 ELOS  Skilled Therapeutic Interventions/Progress Updates:    Tx focus on Rt NMR, dynamic balance, and activity tolerance during ADL/IADL participation.   Pt greeted in recliner, declining shower but agreeable to get dressed. Pt gathering clothing items around room, ambulating at supervision level without AD. He dressed in recliner with cues to sit for LB tasks/safey awareness. Supervision sit<stand. He ambulated to dayroom, [facility] tasks involving simulated grocery shopping. Pt able to provide appropriate sums when shown specific boxed meal items. Worked on Statisticianin-hand manipulation and palm-finger translations with R UE while selecting coins. Pt with decreased smoothness of movement while stacking pennies, but he was able to meet task demands with extra time. Transitioned to IADL tasks in therapy apartment, including vacuuming, sweeping kitchen, and washing cutlery while standing at sink. Pt without LOBs while stooping to manage dustpan on floor, or plug/unplug vacuum in outlet. Pt using R UE at dominant level with min cues. Sit<stand from low couch with supervision. He then ambulated back to room and was left in recliner with all needs within reach. Chair alarm activated.   Therapy Documentation Precautions:  Precautions Precautions: Fall Precaution Comments: (R knee and ankle pain.  h/o R ankle gout.  R knee buckles.) Restrictions Weight Bearing Restrictions: No Pain: No c/o Rt knee/ankle pain throughout session  Pain Assessment Pain Assessment: No/denies pain Pain Score: 0-No pain ADL:       See Function Navigator for Current Functional Status.   Therapy/Group: Individual  Therapy  Umeka Wrench A Macarius Ruark 11/22/2016, 12:15 PM

## 2016-11-22 NOTE — Progress Notes (Signed)
Occupational Therapy Session Note  Patient Details  Name: [name] [name] MRN: [id number] Date of Birth: 11-08-1955  Today's Date: 11/22/2016 OT Concurrent Time: 1030-1158 OT Concurrent Time Calculation (min): 88 min   Short Term Goals: Week 2:  OT Short Term Goal 2 (Week 2): STG=LTG 2/2 ELOS  Skilled Therapeutic Interventions/Progress Updates:    Pt completed community outing to the grocery store during session.  He was able to ambulate throughout entire session with supervision and not assistive device.  Min instructional cueing needed for scanning to his right side so he could located barriers and obstacles to avoid when pushing cart and when carrying groceries.  Therapist discussed grocery shopping with pt and the need to be able to identify areas in the store that he could take a rest break if needed.  Once back to the rehab center, pt carried all groceries in and worked on placing them in the appropriate areas in the kitchen.  He also engaged on completion of a floor transfer as well as scanning activities in the environment to emphasize his need for larger head turns to the right.  Pt finished session with return to room with call button and phone in reach.    Therapy Documentation Precautions:  Precautions Precautions: Fall Precaution Comments: (R knee and ankle pain.  h/o R ankle gout.  R knee buckles.) Restrictions Weight Bearing Restrictions: No  Pain: Pain Assessment Pain Assessment: No/denies pain ADL: See Function Navigator for Current Functional Status.   Therapy/Group: concurrent  Raphel Stickles OTR/L 11/22/2016, 12:35 PM

## 2016-11-22 NOTE — Progress Notes (Signed)
Social Work Patient ID: [name] [name], male   DOB: Jun 14, 1955, 61 y.o.   MRN: [id number]  Met with pt to discuss follow up he has a $60 co-pay for OP therapy and no co-pay for [facility] services. He has chosen home health, no equipment needs. Getting ready for discharge 11/21.

## 2016-11-22 NOTE — Progress Notes (Signed)
Physical Therapy Session Note  Patient Details  Name: [name] [name] MRN: [id number] Date of Birth: 03/01/1955  Today's Date: 11/22/2016 PT Individual Time: 1300-1400 PT Individual Time Calculation (min): 60 min   Short Term Goals: Week 2:  PT Short Term Goal 1 (Week 2): =LTGs   Skilled Therapeutic Interventions/Progress Updates:    no c/o pain.  Session focus on standing balance and activity tolerance.   Pt transfers throughout session mod I and ambulates throughout unit, max distance 300' without device, supervision, occasional verbal cues for attention to R side.  nustep x5 minutes at level 7, LEs only for strengthening and reciprocal stepping pattern retraining.  Pt engaged in standing wii games focus on bilateral UE use and dynamic balance (archery x2, boxing x2, and bowling x1).  Path finding back to room mod I, pt left upright in recliner with call bell in reach and needs met.   Therapy Documentation Precautions:  Precautions Precautions: Fall Precaution Comments: (R knee and ankle pain.  h/o R ankle gout.  R knee buckles.) Restrictions Weight Bearing Restrictions: No   See Function Navigator for Current Functional Status.   Therapy/Group: Individual Therapy  Caitlin E Warren 11/22/2016, 3:11 PM  

## 2016-11-22 NOTE — Progress Notes (Signed)
Subjective/Complaints:  No issues overnite, no ankle pain , discussed allopurinol and BP med dosing  Review of systems: Denies chest pain shortness of breath nausea vomiting constipation  Objective: Vital Signs: Blood pressure 139/76, pulse (!) 53, temperature 97.6 F (36.4 C), temperature source Oral, resp. rate 17, height 5\' 10"  (1.778 m), weight 107.5 kg (237 lb), SpO2 99 %. No results found. Results for orders placed or performed during the hospital encounter of 11/12/16 (from the past 72 hour(s))  Glucose, capillary     Status: None   Collection Time: 11/19/16 11:35 AM  Result Value Ref Range   Glucose-Capillary 86 65 - 99 mg/dL  Glucose, capillary     Status: Abnormal   Collection Time: 11/19/16  4:28 PM  Result Value Ref Range   Glucose-Capillary 121 (H) 65 - 99 mg/dL  Glucose, capillary     Status: Abnormal   Collection Time: 11/19/16  9:07 PM  Result Value Ref Range   Glucose-Capillary 150 (H) 65 - 99 mg/dL  Glucose, capillary     Status: Abnormal   Collection Time: 11/20/16  6:01 AM  Result Value Ref Range   Glucose-Capillary 107 (H) 65 - 99 mg/dL  Glucose, capillary     Status: None   Collection Time: 11/20/16 12:01 PM  Result Value Ref Range   Glucose-Capillary 88 65 - 99 mg/dL   Comment 1 Notify RN   Glucose, capillary     Status: Abnormal   Collection Time: 11/20/16  4:09 PM  Result Value Ref Range   Glucose-Capillary 102 (H) 65 - 99 mg/dL   Comment 1 Notify RN   Glucose, capillary     Status: Abnormal   Collection Time: 11/20/16  8:54 PM  Result Value Ref Range   Glucose-Capillary 117 (H) 65 - 99 mg/dL   Comment 1 Notify RN   Glucose, capillary     Status: Abnormal   Collection Time: 11/21/16  6:47 AM  Result Value Ref Range   Glucose-Capillary 101 (H) 65 - 99 mg/dL   Comment 1 Notify RN   Glucose, capillary     Status: Abnormal   Collection Time: 11/21/16 11:33 AM  Result Value Ref Range   Glucose-Capillary 126 (H) 65 - 99 mg/dL  Glucose,  capillary     Status: Abnormal   Collection Time: 11/21/16  4:41 PM  Result Value Ref Range   Glucose-Capillary 106 (H) 65 - 99 mg/dL  Glucose, capillary     Status: Abnormal   Collection Time: 11/21/16  8:36 PM  Result Value Ref Range   Glucose-Capillary 131 (H) 65 - 99 mg/dL   Comment 1 Notify RN   Glucose, capillary     Status: Abnormal   Collection Time: 11/22/16  6:20 AM  Result Value Ref Range   Glucose-Capillary 114 (H) 65 - 99 mg/dL   Comment 1 Notify RN      HEENT: Normocephalic. Atraumatic. Cardio: RRR and no JVD Resp: CTA B/L and unlabored GI: BS positive and Non distended  Skin:   Intact. Warm and dry. Neuro: Alert/Oriented,  Motor 4+/5 right deltoid, bicep, tricep, finger flexors, 4+/5 at the right hip flexor knee extensor 4-/5 at the ankle dorsi/plantar flexors (stable) Dysarthria Musc/Skel:  No edema. Mild tenderness at right knee (improving) General no acute distress. Well-developed.   Assessment/Plan: 1. Functional deficits secondary to right hemiparesis secondary to left IC posterior limb infarct which require 3+ hours per day of interdisciplinary therapy in a comprehensive inpatient rehab setting. Physiatrist is providing  close team supervision and 24 hour management of active medical problems listed below. Physiatrist and rehab team continue to assess barriers to discharge/monitor patient progress toward functional and medical goals. FIM: Function - Bathing Position: Shower Body parts bathed by patient: Right arm, Left arm, Chest, Abdomen, Front perineal area, Buttocks, Right lower leg, Left lower leg, Left upper leg, Right upper leg Body parts bathed by helper: Back Assist Level: Supervision or verbal cues  Function- Upper Body Dressing/Undressing What is the patient wearing?: Pull over shirt/dress Pull over shirt/dress - Perfomed by patient: Thread/unthread right sleeve, Thread/unthread left sleeve, Put head through opening, Pull shirt over trunk Assist  Level: Set up Set up : To obtain clothing/put away Function - Lower Body Dressing/Undressing What is the patient wearing?: Shoes Position: Wheelchair/chair at sink Shoes - Performed by patient: Don/doff right shoe, Don/doff left shoe, Fasten right, Fasten left Assist for footwear: Independent Assist for lower body dressing: Touching or steadying assistance (Pt > 75%)  Function - Toileting Toileting steps completed by patient: Adjust clothing prior to toileting, Performs perineal hygiene, Adjust clothing after toileting Toileting steps completed by helper: Adjust clothing prior to toileting, Performs perineal hygiene, Adjust clothing after toileting Toileting Assistive Devices: Grab bar or rail Assist level: More than reasonable time  Function - ArchivistToilet Transfers Toilet transfer activity did not occur: Refused Toilet transfer assistive device: Grab bar Assist level to toilet: Supervision or verbal cues Assist level from toilet: Supervision or verbal cues Assist level to bedside commode (at bedside): 2 helpers(per Rona Ravensiera Craven, NT report) Assist level from bedside commode (at bedside): Moderate assist (Pt 50 - 74%/lift or lower)  Function - Chair/bed transfer Chair/bed transfer method: Ambulatory Chair/bed transfer assist level: Supervision or verbal cues Chair/bed transfer assistive device: Armrests Chair/bed transfer details: Verbal cues for safe use of DME/AE  Function - Locomotion: Wheelchair Will patient use wheelchair at discharge?: No Max wheelchair distance: (75 ft) Assist Level: Touching or steadying assistance (Pt > 75%) Assist Level: Touching or steadying assistance (Pt > 75%) Wheel 150 feet activity did not occur: Safety/medical concerns(Fatigue) Turns around,maneuvers to table,bed, and toilet,negotiates 3% grade,maneuvers on rugs and over doorsills: No Function - Locomotion: Ambulation Assistive device: No device Max distance: 300' Assist level: Supervision or verbal  cues Assist level: Supervision or verbal cues Walk 50 feet with 2 turns activity did not occur: Safety/medical concerns(R ankle pain/R knee weakness) Assist level: Supervision or verbal cues Walk 150 feet activity did not occur: Safety/medical concerns Assist level: Supervision or verbal cues Walk 10 feet on uneven surfaces activity did not occur: Safety/medical concerns  Function - Comprehension Comprehension: Auditory Comprehension assist level: Follows basic conversation/direction with extra time/assistive device  Function - Expression Expression: Verbal Expression assist level: Expresses basic needs/ideas: With no assist  Function - Social Interaction Social Interaction assist level: Interacts appropriately with others with medication or extra time (anti-anxiety, antidepressant).  Function - Problem Solving Problem solving assist level: Solves complex problems: With extra time  Function - Memory Memory assist level: Assistive device: No helper Patient normally able to recall (first 3 days only): Current season, Location of own room, Staff names and faces, That he or she is in a hospital  Medical Problem List and Plan: 1.  Right  facial weakness, Right sided weakness with balance deficits secondary to left internal capsule CVA with history of CVA.   Continue CIR PT, OT, SLP 2.  DVT Prophylaxis/Anticoagulation: Pharmaceutical: Lovenox 3. Pain Management: will order tramadol and/or tylenol prn 4. Mood: LCSW  to follow for evaluation and support.  5. Neuropsych: This patient is capable of making decisions on his own behalf. 6. Skin/Wound Care: routine pressure relief measures.  7. Fluids/Electrolytes/Nutrition: Maintain adequate nutrition/hydration. 8. Right knee pain:Appears to be in patellar area Post fall in hospital--X rays reported to be negative. Local measures.    Voltaren gel 9. HTN: Monitor BP bid--continue metoprolol and lisinopril.  Vitals:   11/21/16 1616 11/22/16  0500  BP: (!) 173/99 139/76  Pulse: 60 (!) 53  Resp:  17  Temp:  97.6 F (36.4 C)  SpO2: 100% 99%    Increased zestril   Added amlodipine 2.5mg  on 11/16, increased to 7.5 on 11/18, monitor on current dose, may need to increase again 10mg  if BPs elevated >160 today  10. CKD?: baseline SCr- 1.4.    Creatinine 1. 44 on 11/13   Labs ordered for tomorrow 11. New diagnosis T2DM: Monitor BS ac/hs. Will start low dose amaryl and titrate as indicated. Will use SSI for elevated BS and tighter control. Consult dietitian for dietary education.  CBG (last 3)  Recent Labs    11/21/16 1641 11/21/16 2036 11/22/16 0620  GLUCAP 106* 131* 114*     controlled on 11/19 12. Fluid overload/Chest pain: Has seen cardiology as an outpatient in the past.  Chest x-ray improved after Lasix.Ej fx 65% 13.  Shortness of breath uses albuterol at home presumed asthma versus COPD. 14.  Gout flareup,  tapering Medrol Dosepak improving, resume allopurinol today, increased to 300mg  had attack on 200mg   LOS (Days) 10 A FACE TO FACE EVALUATION WAS PERFORMED  Erick Colacendrew E Macklin Jacquin 11/22/2016, 6:52 AM

## 2016-11-22 NOTE — Consult Note (Signed)
Neuropsychological Consultation   Patient:   [name] [name]   DOB:   [birth year]  MR Number:  [id number]  Location:  [facility] [address] [facility] [id number] [place] [address] Dept: [place]: [phone]           Date of Service:   11/22/2016  Start Time:   8 AM End Time:   9 AM  Provider/Observer:  Ilean Skill, Psy.D.       Clinical Neuropsychologist       Billing Code/Service: [phone] 4 Units  Chief Complaint:    [name] [name] is a 61 year old male who has a history of hypertension, gout, and previous stroke.  He admits to not being completely compliant with medications.  The patient presented on 11/09/2016 with right-sided weakness, inability to talk, recent fall, word finding deficits and right side facial droop.  MRI revealed acute left internal capsule lacunar infarct and subacute small left frontal white matter infarct as well as a old right basal ganglia infarct.  The patient has made significant improvements in his expressive language abilities as well as right side motor function since this stroke event.  However, the patient does report significant anxiety and stress with his current hospital stay primarily due to feeling cooped up and worrying about returning home and going back to work.  Reason for Service:  [name] [name] was referred for a neuropsychological consultation due to adjustment and coping issues following a CVA primarily affecting the left hemisphere.  Right hemiparesis and expressive language functioning have improved significantly but the patient is still having difficulties with coping and adjustment.  Below is the HPI for the current admission.  The patient has a recent diagnosis of type 2 diabetes and has likely not been taking care of himself very well medically.  HPI: [name] [name] is a 61 year old male with history of HTN, gout, prior CVA, medication non-compliance who  was admitted to [facility] on 11/09/16 with right sided weakness, inability to walk, fall,and right facial droop. History taken from chart review and patient. BP reported to be 240/120 per EMS. He was started on IVF for question AKI with BUN/Scr 24/1.4. MRI brain done revealing acute left internal capsule lacunar infarct and subacute small left frontal white matter infarct and old left> right basal ganglia infarcts and mild parenchymal volume loss. Cardiac echo done showing EF 55-60% with moderate to severe AR and diastolic dysfunction. Carotid dopplers were negative for significant ICA stenosis. Low dose ASA recommended for thrombotic stroke due to small vessel disease and medications added for management of labile blood pressures. He was found to have elevated BS and new diagnosis of diabetes. Swallow evaluation showed mild oral phase dysphagia and diet modified to dysphagia 3 with chopped meats.  Patient with left facial weakness, left sided weakness with balance deficits and has had significant decline in mobility and ability to carry out ADLs. CIR recommended by rehab team. Upon arrival, patient noted to have chest pain with SOB.  Current Status:  The patient reports that he is having a lot of anxiety and tension with being in the hospital itself but knows that he is continuing to make significant progress and needs to continue to work on rehabilitative efforts.  He has been discharged on Wednesday and has met most of his targeted goals so far.  There has been significant improvement in his overall functioning.  Behavioral Observation: [name] [name]  presents as a 61  y.o.-year-old Right Caucasian Male who appeared his stated age. his dress was Appropriate and he was Well Groomed and his manners were Appropriate to the situation.  his participation was indicative of Appropriate and Attentive behaviors.  There were  physical disabilities noted related to fine motor function and strength in right leg.  he displayed  an appropriate level of cooperation and motivation.     Interactions:    Active Appropriate and Attentive  Attention:   within normal limits and attention span and concentration were age appropriate  Memory:   abnormal; remote memory intact, recent memory impaired primarily related to some mild retrieval issues.  Visuo-spatial:  within normal limits  Speech (Volume):  low  Speech:   normal; non-fluent aphasia primarily related to very mild word finding and retrieval issues.  The patient reports that he will eventually get the words that he is after.  This is a significant improvement over the past couple of weeks.  Thought Process:  Coherent and Relevant  Though Content:  WNL; not suicidal  Orientation:   person, place, time/date and situation  Judgment:   Good  Planning:   Good  Affect:    Anxious  Mood:    Anxious  Insight:   Good  Intelligence:   high  Marital Status/Living: The patient is married and he and his wife for raising their 42-year-old foster child.  Current Employment: The patient is a Chartered certified accountant for [employer].  Education:   Engineering geologist History:   Past Medical History:  Diagnosis Date  . CVA (cerebral vascular accident) ([place]) 2014  . Hypertension   . Obesity (BMI 30.0-34.9)   . Sleep disorder         Family Med/Psych History:  Family History  Problem Relation Age of Onset  . Hypertension Mother   . Hypertension Father   . Colon cancer Paternal Grandmother     Risk of Suicide/Violence: low patient denies any suicidal or homicidal ideation.  Impression/DX:  [name] [name] is a 61 year old male who has a history of hypertension, gout, and previous stroke.  He admits to not being completely compliant with medications.  The patient presented on 11/09/2016 with right-sided weakness, inability to talk, recent fall, word finding deficits and right side facial droop.  MRI revealed acute left internal capsule lacunar infarct and  subacute small left frontal white matter infarct as well as a old right basal ganglia infarct.  The patient has made significant improvements in his expressive language abilities as well as right side motor function since this stroke event.  However, the patient does report significant anxiety and stress with his current hospital stay primarily due to feeling cooped up and worrying about returning home and going back to work.  The patient reports that he is having a lot of anxiety and tension with being in the hospital itself but knows that he is continuing to make significant progress and needs to continue to work on rehabilitative efforts.  He has been discharged on Wednesday and has met most of his targeted goals so far.  There has been significant improvement in his overall functioning.   Today we worked on Therapist, occupational and strategies around adapting to and adjusting to both the current situations and symptoms as well as some of the anxiety producing worries about return to work and return home.  Diagnosis:   Anxiety State, due to medical situation       Electronically Signed   _______________________ Ilean Skill, Psy.D.

## 2016-11-22 NOTE — Progress Notes (Signed)
Recreational Therapy Session Note  Patient Details  Name: [name] [name] MRN: [id number] Date of Birth: 1955-05-19 Today's Date: 11/22/2016  Pain: no c/o  Skilled Therapeutic Interventions/Progress Updates: Pt participated in community reintegration/outing to [employer] [facility] [employer]	[place] without AD. Pt did require min questioning cues for right inattention.  Goals focused on safe community mobility, scanning/attending to the right, identification & negotiation of obstacles, energy conservation techniques/education.  Pt ambulated carrying filled grocery bags and then tri-folding therapy floor mat with supervision.  Pt performed floor transfer with supervision.  Discussed discharge planning with focus on safety awareness/right inattention. See outing goal sheet in shadow chart for full details.   11/22/2016, 12:31 PM

## 2016-11-22 NOTE — Plan of Care (Signed)
Education focused on self management of health issues such as DM, gout and HTN.  Patient states he is comfortable with the information provided to him and understands the importance of follow up medical care upon discharge.  He also states he has watched some of the diabetes education videos which has increased his understanding of the progression of the disease.  Patient states his gout flare has subsided with the steroids provided and is back on his daily maintenance medication.  Will continue to reinforce teaching.  Dani Gobbleeardon, Danzel Marszalek J, RN

## 2016-11-22 NOTE — Plan of Care (Signed)
Ambulated with family Denies any pain at present Continent of B/B

## 2016-11-22 NOTE — Plan of Care (Signed)
  Progressing RH SKIN INTEGRITY RH STG SKIN FREE OF INFECTION/BREAKDOWN Description min  11/22/2016 1316 - Progressing by Dani Gobbleeardon, Kaynen Minner J, RN RH STG MAINTAIN SKIN INTEGRITY WITH ASSISTANCE Description STG Maintain Skin Integrity With min Assistance.  11/22/2016 1316 - Progressing by Dani Gobbleeardon, Kaliegh Willadsen J, RN RH SAFETY RH STG ADHERE TO SAFETY PRECAUTIONS W/ASSISTANCE/DEVICE Description STG Adhere to Safety Precautions With min Assistance/Device.  11/22/2016 1316 - Progressing by Dani Gobbleeardon, Lestat Golob J, RN RH STG DECREASED RISK OF FALL WITH ASSISTANCE Description STG Decreased Risk of Fall With Assistance. 11/22/2016 1316 - Progressing by Dani Gobbleeardon, Bron Snellings J, RN RH COGNITION-NURSING RH STG USES MEMORY AIDS/STRATEGIES W/ASSIST TO PROBLEM SOLVE Description STG Uses Memory Aids/Strategies With min Assistance to Problem Solve.  11/22/2016 1316 - Progressing by Dani Gobbleeardon, Kieran Nachtigal J, RN RH KNOWLEDGE DEFICIT RH STG INCREASE KNOWLEDGE OF DIABETES Description Pt will demonstrate increased knowledge of DM at discharge with Mod I assist using cues/printed resources  11/22/2016 1316 - Progressing by Dani Gobbleeardon, Laina Guerrieri J, RN RH STG INCREASE KNOWLEDGE OF HYPERTENSION Description Pt will demonstrate increased knowledge of DM at discharge with Mod I assist using cues/printed resources  11/22/2016 1316 - Progressing by Dani Gobbleeardon, Melitta Tigue J, RN

## 2016-11-23 ENCOUNTER — Inpatient Hospital Stay (HOSPITAL_COMMUNITY): Payer: BLUE CROSS/BLUE SHIELD | Admitting: Occupational Therapy

## 2016-11-23 ENCOUNTER — Encounter (HOSPITAL_COMMUNITY): Payer: Self-pay

## 2016-11-23 ENCOUNTER — Inpatient Hospital Stay (HOSPITAL_COMMUNITY): Payer: BLUE CROSS/BLUE SHIELD | Admitting: Physical Therapy

## 2016-11-23 LAB — GLUCOSE, CAPILLARY
GLUCOSE-CAPILLARY: 93 mg/dL (ref 65–99)
Glucose-Capillary: 98 mg/dL (ref 65–99)

## 2016-11-23 MED ORDER — AMLODIPINE BESYLATE 10 MG PO TABS
10.0000 mg | ORAL_TABLET | Freq: Every day | ORAL | Status: DC
  Administered 2016-11-23: 10 mg via ORAL
  Filled 2016-11-23: qty 1

## 2016-11-23 MED ORDER — AMLODIPINE BESYLATE 10 MG PO TABS: 10.0000 mg | ORAL_TABLET | Freq: Every day | ORAL | 0 refills | Status: DC

## 2016-11-23 MED ORDER — GLIMEPIRIDE 1 MG PO TABS: 1.0000 mg | ORAL_TABLET | Freq: Every day | ORAL | 0 refills | Status: DC

## 2016-11-23 MED ORDER — LISINOPRIL 20 MG PO TABS
20.0000 mg | ORAL_TABLET | Freq: Every day | ORAL | 0 refills | Status: DC
Start: 2016-11-24 — End: 2019-10-16

## 2016-11-23 MED ORDER — METOPROLOL SUCCINATE ER 100 MG PO TB24
100.0000 mg | ORAL_TABLET | Freq: Every day | ORAL | 0 refills | Status: DC
Start: 2016-11-23 — End: 2016-12-10

## 2016-11-23 MED ORDER — ALLOPURINOL 300 MG PO TABS: 300.0000 mg | ORAL_TABLET | Freq: Every day | ORAL | 0 refills | Status: DC

## 2016-11-23 MED ORDER — ATORVASTATIN CALCIUM 20 MG PO TABS: 20.0000 mg | ORAL_TABLET | Freq: Every day | ORAL | 0 refills | Status: DC

## 2016-11-23 NOTE — Discharge Summary (Signed)
Physician Discharge Summary  Patient ID: [name] [name] MRN: [id number] DOB/AGE: January 30, 1955 61 y.o.  Admit date: 11/12/2016 Discharge date: 11/23/2016  Discharge Diagnoses:  Principal Problem:   Stroke due to embolism of left middle cerebral artery (HCC) Active Problems:   Shortness of breath   Left hemiparesis (HCC)   Benign essential HTN   Stage 3 chronic kidney disease (HCC)   Newly diagnosed diabetes (HCC)   Chest pain   Acute idiopathic gout of right knee   Hypertensive crisis   Anxiety state   Discharged Condition: stable   Significant Diagnostic Studies: Dg Chest Port 1 View  Result Date: 11/12/2016 CLINICAL DATA:  Short of breath EXAM: PORTABLE CHEST 1 VIEW COMPARISON:  CT and x-ray 11/11/2016 FINDINGS: Improvement in bilateral airspace disease. Bibasilar airspace disease remains. Probable clearing heart failure. No effusion. Heart size upper normal. IMPRESSION: Improving bilateral airspace disease compatible with improving heart failure. Electronically Signed   By: Marlan Palauharles  Clark M.D.   On: 11/12/2016 20:26    Labs:  Basic Metabolic Panel: Recent Labs  Lab 11/22/16 0734  NA 139  K 4.8  CL 105  CO2 28  GLUCOSE 100*  BUN 29*  CREATININE 1.53*  CALCIUM 9.2    CBC: Recent Labs  Lab 11/19/16 0506  WBC 7.7  HGB 14.1  HCT 40.7  MCV 85.1  PLT 245    CBG: Recent Labs  Lab 11/22/16 1200 11/22/16 1709 11/22/16 2057 11/23/16 0629 11/23/16 1130  GLUCAP 82 131* 116* 93 98    Brief HPI:   [name] [name] is a 61 year old male with history of HTN, gout, prior CVA, medication non-compliance who [facility] with right sided weakness, inability to walk, fall, word finding deficits and right facial droop.  BP reported to be 240/120 per EMS with question of AKI with BUN/Scr 24/1.4.  He was started on  IVF for hydration. MRI brain done revealing acute left internal capsule lacunar infarct and subacute small left frontal white matter infarct and  old left> right basal ganglia infarcts and mild parenchymal volume loss. Cardiac echo done showing EF 55-60% with moderate to severe AR and diastolic dysfunction.  Low dose ASA recommended for thrombotic stroke due to small vessel disease. Hospital course significant for labile blood pressures, new diagnosis of diabetes and SOB due to fluid overload. Patient with resultant left facial weakness, left sided weakness with balance deficits affecting mobility and ability to carry out ADLs. CIR was recommended by rehab team.    Hospital Course: [name] [name] was admitted to rehab 11/12/2016 for inpatient therapies to consist of PT, ST and OT at least three hours five days a week. Past admission physiatrist, therapy team and rehab RN have worked together to provide customized collaborative inpatient rehab. He reported chest discomfort with SOB and noted to have malignant HTN at 200/103 during process of admission. CXR showed bibasilar air space disease with improvement in heart failure. He was treated with IV lasix for further diuresis. Respiratory status has improved and no further episodes reported with increase in activity. Renal status has been monitored showing rise in SCr likely do baseline. Blood pressures continued to be labile therefore lisinopril was titrated upwards and amlodipine was added for better control.   Reactive leucocytosis is resolving. Right knee pain has improved with use of Voltaren gel.  BLE dopplers done due to edema RLE and were negative for DVT. Right ankle pain due to gout flare up was unresponsive to colchicine.  He was started on medrol dose pack with improvement in symptoms.  Gout flare up has resolved and allopurinol was resumed at 300 mg daily to avoid further flare ups. Blood sugars have been monitored on ac/hs basis and have shown reasonable control.  He is continent of bowel and bladder. He has made steady progress and is modified independent at discharge. He will continue to  receive follow up HHPT and HHOT by [facility] after discharge.     Rehab course: During patient's stay in rehab weekly team conferences were held to monitor patient's progress, set goals and discuss barriers to discharge. He required mod assist with basic self care tasks and mobility. He exhibited mild oral phase dysphagia with mild dysarthria, word finding deficits and deficits in semi complex problem solving. He has had improvement in activity tolerance, balance, postural control, as well as ability to compensate for deficits. He is has had improvement in functional use RUE  and RLE as well as improved awareness   Disposition: 01-Home or Self Care  Diet: Heart healthy/diabetic diet.  Special Instructions: 1. Repeat  BMET in 5-7 days. 2. Check blood sugars 2-3 times a day. 3. Need to take medications daily as prescribed.    Discharge Instructions    Ambulatory referral to Physical Medicine Rehab   Complete by:  As directed    1-2 weeks transitional care appt     Discharge Medication List as of 11/23/2016  2:49 PM    START taking these medications   Details  amLODipine (NORVASC) 10 MG tablet Take 1 tablet (10 mg total) by mouth daily., Starting Wed 11/24/2016, Normal    glimepiride (AMARYL) 1 MG tablet Take 1 tablet (1 mg total) by mouth daily with breakfast., Starting Wed 11/24/2016, Normal      CONTINUE these medications which have CHANGED   Details  allopurinol (ZYLOPRIM) 300 MG tablet Take 1 tablet (300 mg total) by mouth daily., Starting Wed 11/24/2016, Normal    atorvastatin (LIPITOR) 20 MG tablet Take 1 tablet (20 mg total) by mouth daily., Starting Tue 11/23/2016, Until Mon 02/21/2017, Normal    lisinopril (PRINIVIL,ZESTRIL) 20 MG tablet Take 1 tablet (20 mg total) by mouth daily., Starting Wed 11/24/2016, Normal    metoprolol succinate (TOPROL-XL) 100 MG 24 hr tablet Take 1 tablet (100 mg total) by mouth daily., Starting Tue 11/23/2016, Normal      CONTINUE  these medications which have NOT CHANGED   Details  aspirin EC 81 MG tablet Take 81 mg daily by mouth., Historical Med    polyethylene glycol (MIRALAX / GLYCOLAX) packet Take 17 g daily by mouth., Historical Med      STOP taking these medications     cloNIDine (CATAPRES) 0.2 MG tablet      pravastatin (PRAVACHOL) 20 MG tablet        Follow-up Information    Kirsteins, Victorino SparrowAndrew E, MD Follow up.   Specialty:  Physical Medicine and Rehabilitation Why:  Office will call you with follow up appointment Contact information: [address] [place] [address] [phone]        Noni Saupeedding, John F. II, MD Follow up on 11/29/2016.   Specialty:  Family Medicine Why:  Appointment @ 9:40 AM.  Discuss referral to neurologist in town. Contact information: [address] [place] [address] [phone]           Signed: Jacquelynn CreeLove, Beauty Pless S 11/23/2016, 5:09 PM

## 2016-11-23 NOTE — Progress Notes (Signed)
Physical Therapy Discharge Summary  Patient Details  Name: [name] [name] MRN: [id number] Date of Birth: 1955/02/02  Today's Date: 11/23/2016 PT Individual Time: 8366-2947 PT Individual Time Calculation (min): 40 min    Patient has met 9 of 9 long term goals due to improved activity tolerance, improved balance, improved postural control, increased strength, functional use of  right upper extremity and right lower extremity, improved attention, improved awareness and improved coordination.  Patient to discharge at an ambulatory level Modified Independent.     Recommendation:  Patient will benefit from ongoing skilled PT services in home health setting to continue to advance safe functional mobility, address ongoing impairments in balance and safety awareness, and minimize fall risk.  Equipment: No equipment provided  Reasons for discharge: treatment goals met  Patient/family agrees with progress made and goals achieved: Yes   Skilled PT Intervention: No c/o pain.  Session focus on d/c assessment and pt education.  Pt completed all mobility at mod I level.  PT provided d/c education regarding current level of function, falls risk, f/u recommendations, and reminded pt to attend to RLE dragging.  Pt verbalized understanding of all.  Returned to room at end of session.   PT Discharge Precautions/Restrictions Precautions Precautions: Fall Precaution Comments: mild R inattention Restrictions Weight Bearing Restrictions: No Pain Pain Assessment Pain Assessment: No/denies pain Vision/Perception  Perception Perception: Impaired Comments: some mild R inattention ongoing, but improving from evaluation Praxis Praxis: Intact  Cognition Overall Cognitive Status: Impaired/Different from baseline Arousal/Alertness: Awake/alert Orientation Level: Oriented X4 Attention: Selective Sustained Attention: Appears intact Selective Attention: Appears intact Memory: Impaired Memory Impairment:  Decreased recall of new information Awareness: Appears intact Problem Solving: Appears intact Safety/Judgment: Appears intact Sensation Sensation Light Touch: Appears Intact Hot/Cold: Appears Intact Proprioception: Appears Intact Coordination Gross Motor Movements are Fluid and Coordinated: No Fine Motor Movements are Fluid and Coordinated: No Coordination and Movement Description: gross and fine motor coordination improved since eval, R hand coordination continues to be slightly slower than L Heel Shin Test: R=L Motor  Motor Motor - Discharge Observations: ongoing mild R hemiparesis but improved since eval  Mobility Transfers Sit to Stand: 6: Modified independent (Device/Increase time) Stand to Sit: 6: Modified independent (Device/Increase time) Locomotion  Ambulation Ambulation: Yes Ambulation/Gait Assistance: 6: Modified independent (Device/Increase time) Ambulation Distance (Feet): 300 Feet Assistive device: None Gait Gait: No Stairs / Additional Locomotion Stairs: Yes Stairs Assistance: 6: Modified independent (Device/Increase time) Stair Management Technique: No rails Number of Stairs: 12 Ramp: 6: Modified independent (Device) Curb: 6: Modified independent (Device/increase time) Wheelchair Mobility Wheelchair Mobility: No  Trunk/Postural Assessment  Cervical Assessment Cervical Assessment: (forward head) Thoracic Assessment Thoracic Assessment: (rounded shoulder) Lumbar Assessment Lumbar Assessment: Within Functional Limits Postural Control Postural Control: Within Functional Limits  Balance Balance Balance Assessed: Yes Berg Balance Test Sit to Stand: Able to stand without using hands and stabilize independently Standing Unsupported: Able to stand safely 2 minutes Sitting with Back Unsupported but Feet Supported on Floor or Stool: Able to sit safely and securely 2 minutes Stand to Sit: Sits safely with minimal use of hands Transfers: Able to transfer  safely, minor use of hands Standing Unsupported with Eyes Closed: Able to stand 10 seconds safely Standing Ubsupported with Feet Together: Able to place feet together independently and stand 1 minute safely From Standing, Reach Forward with Outstretched Arm: Can reach confidently >25 cm (10") From Standing Position, Pick up Object from Floor: Able to pick up shoe safely and easily From Standing Position,  Turn to Look Behind Over each Shoulder: Looks behind from both sides and weight shifts well Turn 360 Degrees: Able to turn 360 degrees safely in 4 seconds or less Standing Unsupported, Alternately Place Feet on Step/Stool: Able to stand independently and complete 8 steps >20 seconds Standing Unsupported, One Foot in Front: Able to plae foot ahead of the other independently and hold 30 seconds Standing on One Leg: Able to lift leg independently and hold 5-10 seconds Total Score: 53 Dynamic Sitting Balance Dynamic Sitting - Balance Support: During functional activity Dynamic Sitting - Level of Assistance: 6: Modified independent (Device/Increase time) Static Standing Balance Static Standing - Balance Support: During functional activity Static Standing - Level of Assistance: 6: Modified independent (Device/Increase time) Dynamic Standing Balance Dynamic Standing - Balance Support: During functional activity Dynamic Standing - Level of Assistance: 6: Modified independent (Device/Increase time) Extremity Assessment  RUE Assessment RUE Assessment: Exceptions to [facility] RUE Strength RUE Overall Strength Comments: 4/5 overall LUE Assessment LUE Assessment: Within Functional Limits RLE Assessment RLE Assessment: Within Functional Limits(5/5 proximal to distal) LLE Assessment LLE Assessment: Within Functional Limits(5/5 proximal to distal)   See Function Navigator for Current Functional Status.  Michel Santee 11/23/2016, 2:26 PM

## 2016-11-23 NOTE — Progress Notes (Deleted)
Occupational Therapy Discharge Summary  Patient Details  Name: [name] [name] MRN: [id number] Date of Birth: 11-12-1955  Patient has met 10 of 10 long term goals due to improved activity tolerance, improved balance, postural control, ability to compensate for deficits, functional use of  RIGHT upper and RIGHT lower extremity, improved attention, improved awareness and improved coordination.  Patient to discharge at overall Modified Independent level with supervision level bathing and tub shower transfers.   Reasons goals not met: n/a  Recommendation:  Patient will benefit from ongoing skilled OT services in home health setting to continue to advance functional skills in the area of BADL.  Equipment: No equipment provided  Reasons for discharge: treatment goals met and discharge from hospital  Patient/family agrees with progress made and goals achieved: Yes  OT Discharge Precautions/Restrictions  Precautions Precautions: Fall Restrictions Weight Bearing Restrictions: No Pain  none/denies pain ADL ADL Eating: Modified independent Grooming: Modified independent Upper Body Bathing: Supervision/safety Lower Body Bathing: Supervision/safety Upper Body Dressing: Modified independent (Device) Lower Body Dressing: Modified independent Toileting: Modified independent Toilet Transfer: Modified independent Tub/Shower Transfer: Distant supervision Tub/Shower Transfer Method: Ambulating Perception  Perception: Impaired Comments: Continues to have some inattention to R, but improved since eval Praxis Praxis: Intact Cognition Arousal/Alertness: Awake/alert Orientation Level: Oriented X4 Safety/Judgment: Appears intact Sensation Sensation Light Touch: Appears Intact Hot/Cold: Appears Intact Proprioception: Appears Intact Coordination Coordination and Movement Description: gross and fine motor coordination improved since eval, R hand coordination continues to be slightly slower  than L Motor  Motor Motor - Discharge Observations: Increaesd R strength/coordination, still mild hemiplegis but improved since eval Mobility  Transfers Sit to Stand: 6: Modified independent (Device/Increase time) Stand to Sit: 6: Modified independent (Device/Increase time)  Balance Balance Balance Assessed: Yes Dynamic Sitting Balance Dynamic Sitting - Balance Support: During functional activity Dynamic Sitting - Level of Assistance: 6: Modified independent (Device/Increase time) Static Standing Balance Static Standing - Balance Support: During functional activity Static Standing - Level of Assistance: 6: Modified independent (Device/Increase time) Dynamic Standing Balance Dynamic Standing - Balance Support: During functional activity Dynamic Standing - Level of Assistance: 6: Modified independent (Device/Increase time) Extremity/Trunk Assessment RUE Assessment RUE Assessment: Exceptions to [facility] RUE Strength RUE Overall Strength Comments: 4/5 overall LUE Assessment LUE Assessment: Within Functional Limits   See Function Navigator for Current Functional Status.  [name] [name] [name] [name] 11/23/2016, 12:56 PM

## 2016-11-23 NOTE — Progress Notes (Signed)
Social Work Discharge Note Discharge Note  The overall goal for the admission was met for:   Discharge location: Yes-HOME WITH WIFE   Length of Stay: Yes-11 DAYS  Discharge activity level: Yes-MOD/I LEVEL  Home/community participation: Yes  Services provided included: MD, RD, PT, OT, SLP, RN, CM, TR, Pharmacy, Neuropsych and SW  Financial Services: Private Insurance: [place]  Follow-up services arranged: Home Health: [facility] and Patient/Family has no preference for HH/DME agencies  Comments (or additional information):PT DID WELL AND REACHED HIS GOALS SOONER AND WAS DISCHARGED A DAY EARLIER.  Patient/Family verbalized understanding of follow-up arrangements: Yes  Individual responsible for coordination of the follow-up plan: SELF   Confirmed correct DME delivered: [name] [name] 11/23/2016    [name] [name]

## 2016-11-23 NOTE — Progress Notes (Signed)
Pt discharged at 1500 with daughter to home. Discharge instructions given to pt by Marissa NestlePam Love, PA with verbal understanding. Belongings with pt.

## 2016-11-23 NOTE — Progress Notes (Signed)
Subjective/Complaints:  No SOB, amb with therapy 300' yesterday, asking about d/c today vs tomorrow  Review of systems: Denies chest pain shortness of breath nausea vomiting constipation  Objective: Vital Signs: Blood pressure (!) 171/90, pulse 76, temperature 97.7 F (36.5 C), temperature source Oral, resp. rate 16, height '5\' 10"'  (1.778 m), weight 107.5 kg (237 lb), SpO2 (!) 76 %. No results found. Results for orders placed or performed during the hospital encounter of 11/12/16 (from the past 72 hour(s))  Glucose, capillary     Status: None   Collection Time: 11/20/16 12:01 PM  Result Value Ref Range   Glucose-Capillary 88 65 - 99 mg/dL   Comment 1 Notify RN   Glucose, capillary     Status: Abnormal   Collection Time: 11/20/16  4:09 PM  Result Value Ref Range   Glucose-Capillary 102 (H) 65 - 99 mg/dL   Comment 1 Notify RN   Glucose, capillary     Status: Abnormal   Collection Time: 11/20/16  8:54 PM  Result Value Ref Range   Glucose-Capillary 117 (H) 65 - 99 mg/dL   Comment 1 Notify RN   Glucose, capillary     Status: Abnormal   Collection Time: 11/21/16  6:47 AM  Result Value Ref Range   Glucose-Capillary 101 (H) 65 - 99 mg/dL   Comment 1 Notify RN   Glucose, capillary     Status: Abnormal   Collection Time: 11/21/16 11:33 AM  Result Value Ref Range   Glucose-Capillary 126 (H) 65 - 99 mg/dL  Glucose, capillary     Status: Abnormal   Collection Time: 11/21/16  4:41 PM  Result Value Ref Range   Glucose-Capillary 106 (H) 65 - 99 mg/dL  Glucose, capillary     Status: Abnormal   Collection Time: 11/21/16  8:36 PM  Result Value Ref Range   Glucose-Capillary 131 (H) 65 - 99 mg/dL   Comment 1 Notify RN   Glucose, capillary     Status: Abnormal   Collection Time: 11/22/16  6:20 AM  Result Value Ref Range   Glucose-Capillary 114 (H) 65 - 99 mg/dL   Comment 1 Notify RN   Basic metabolic panel     Status: Abnormal   Collection Time: 11/22/16  7:34 AM  Result Value Ref  Range   Sodium 139 135 - 145 mmol/L   Potassium 4.8 3.5 - 5.1 mmol/L   Chloride 105 101 - 111 mmol/L   CO2 28 22 - 32 mmol/L   Glucose, Bld 100 (H) 65 - 99 mg/dL   BUN 29 (H) 6 - 20 mg/dL   Creatinine, Ser 1.53 (H) 0.61 - 1.24 mg/dL   Calcium 9.2 8.9 - 10.3 mg/dL   GFR calc non Af Amer 48 (L) >60 mL/min   GFR calc Af Amer 56 (L) >60 mL/min    Comment: (NOTE) The eGFR has been calculated using the CKD EPI equation. This calculation has not been validated in all clinical situations. eGFR's persistently <60 mL/min signify possible Chronic Kidney Disease.    Anion gap 6 5 - 15  Glucose, capillary     Status: None   Collection Time: 11/22/16 12:00 PM  Result Value Ref Range   Glucose-Capillary 82 65 - 99 mg/dL   Comment 1 Notify RN   Glucose, capillary     Status: Abnormal   Collection Time: 11/22/16  5:09 PM  Result Value Ref Range   Glucose-Capillary 131 (H) 65 - 99 mg/dL   Comment 1 Notify RN  Glucose, capillary     Status: Abnormal   Collection Time: 11/22/16  8:57 PM  Result Value Ref Range   Glucose-Capillary 116 (H) 65 - 99 mg/dL  Glucose, capillary     Status: None   Collection Time: 11/23/16  6:29 AM  Result Value Ref Range   Glucose-Capillary 93 65 - 99 mg/dL     HEENT: Normocephalic. Atraumatic. Cardio: RRR and no JVD Resp: CTA B/L and unlabored GI: BS positive and Non distended  Skin:   Intact. Warm and dry. Neuro: Alert/Oriented,  Motor 4+/5 right deltoid, bicep, tricep, finger flexors, 4+/5 at the right hip flexor knee extensor 4-/5 at the ankle dorsi/plantar flexors (stable) Dysarthria Musc/Skel:  No edema. Mild tenderness at right knee (improving) General no acute distress. Well-developed.   Assessment/Plan: 1. Functional deficits secondary to right hemiparesis secondary to left IC posterior limb infarct which require 3+ hours per day of interdisciplinary therapy in a comprehensive inpatient rehab setting. Physiatrist is providing close team supervision  and 24 hour management of active medical problems listed below. Physiatrist and rehab team continue to assess barriers to discharge/monitor patient progress toward functional and medical goals. FIM: Function - Bathing Position: Shower Body parts bathed by patient: Right arm, Left arm, Chest, Abdomen, Front perineal area, Buttocks, Right lower leg, Left lower leg, Left upper leg, Right upper leg Body parts bathed by helper: Back Assist Level: Supervision or verbal cues  Function- Upper Body Dressing/Undressing What is the patient wearing?: Pull over shirt/dress Pull over shirt/dress - Perfomed by patient: Thread/unthread right sleeve, Thread/unthread left sleeve, Put head through opening, Pull shirt over trunk Assist Level: Set up Set up : To obtain clothing/put away Function - Lower Body Dressing/Undressing What is the patient wearing?: Underwear, Pants, Socks, Shoes Position: Other (comment)(sitting in recliner) Underwear - Performed by patient: Thread/unthread right underwear leg, Thread/unthread left underwear leg, Pull underwear up/down Pants- Performed by patient: Thread/unthread right pants leg, Thread/unthread left pants leg, Pull pants up/down Socks - Performed by patient: Don/doff right sock, Don/doff left sock Shoes - Performed by patient: Don/doff right shoe, Don/doff left shoe, Fasten right, Fasten left Assist for footwear: Setup Assist for lower body dressing: Supervision or verbal cues  Function - Toileting Toileting steps completed by patient: Adjust clothing prior to toileting, Performs perineal hygiene, Adjust clothing after toileting Toileting steps completed by helper: Adjust clothing prior to toileting, Performs perineal hygiene, Adjust clothing after toileting Toileting Assistive Devices: Grab bar or rail Assist level: More than reasonable time  Function - Air cabin crew transfer activity did not occur: Risk analyst transfer assistive device: Grab bar,  Walker Assist level to toilet: Supervision or verbal cues Assist level from toilet: Supervision or verbal cues Assist level to bedside commode (at bedside): 2 helpers(per Elmo Putt, NT report) Assist level from bedside commode (at bedside): Moderate assist (Pt 50 - 74%/lift or lower)  Function - Chair/bed transfer Chair/bed transfer method: Stand pivot Chair/bed transfer assist level: No Help, no cues, assistive device, takes more than a reasonable amount of time Chair/bed transfer assistive device: Armrests Chair/bed transfer details: Visual cues for safe use of DME/AE  Function - Locomotion: Wheelchair Will patient use wheelchair at discharge?: No Max wheelchair distance: (75 ft) Assist Level: Touching or steadying assistance (Pt > 75%) Assist Level: Touching or steadying assistance (Pt > 75%) Wheel 150 feet activity did not occur: Safety/medical concerns(Fatigue) Turns around,maneuvers to table,bed, and toilet,negotiates 3% grade,maneuvers on rugs and over doorsills: No Function - Locomotion: Ambulation Assistive device:  No device Max distance: >300 Assist level: Supervision or verbal cues Assist level: Supervision or verbal cues Walk 50 feet with 2 turns activity did not occur: Safety/medical concerns(R ankle pain/R knee weakness) Assist level: Supervision or verbal cues Walk 150 feet activity did not occur: Safety/medical concerns Assist level: Supervision or verbal cues Walk 10 feet on uneven surfaces activity did not occur: Safety/medical concerns Assist level: Supervision or verbal cues  Function - Comprehension Comprehension: Auditory Comprehension assist level: Follows basic conversation/direction with extra time/assistive device  Function - Expression Expression: Verbal Expression assist level: Expresses basic needs/ideas: With no assist  Function - Social Interaction Social Interaction assist level: Interacts appropriately with others with medication or extra  time (anti-anxiety, antidepressant).  Function - Problem Solving Problem solving assist level: Solves basic problems with no assist  Function - Memory Memory assist level: Assistive device: No helper Patient normally able to recall (first 3 days only): Location of own room, Current season, Staff names and faces, That he or she is in a hospital  Medical Problem List and Plan: 1.  Right  facial weakness, Right sided weakness with balance deficits secondary to left internal capsule CVA with history of CVA.   Continue CIR PT, OT, SLP- may d/c today after therapy 2.  DVT Prophylaxis/Anticoagulation: Pharmaceutical: Lovenox 3. Pain Management: will order tramadol and/or tylenol prn 4. Mood: LCSW to follow for evaluation and support.  5. Neuropsych: This patient is capable of making decisions on his own behalf. 6. Skin/Wound Care: routine pressure relief measures.  7. Fluids/Electrolytes/Nutrition: Maintain adequate nutrition/hydration. 8. Right knee pain:Appears to be in patellar area Post fall in hospital--X rays reported to be negative. Local measures.    Voltaren gel 9. HTN: Monitor BP bid--continue metoprolol and lisinopril.  Vitals:   11/22/16 1500 11/23/16 0530  BP: (!) 173/96 (!) 171/90  Pulse: 82 76  Resp: 20 16  Temp: 98.2 F (36.8 C) 97.7 F (36.5 C)  SpO2: (!) 87% (!) 76%    Increased zestril   Added amlodipine 2.45m on 11/16, increased to 7.5 on 11/18,need to increase again 161m BPs elevated >160 11/20  10. CKD?: baseline SCr- 1.4.    Creatinine 1. 44 on 11/13, 1.53 on 11/20   At baseline 11. New diagnosis T2DM: Monitor BS ac/hs. Will start low dose amaryl and titrate as indicated. Will use SSI for elevated BS and tighter control. Consult dietitian for dietary education.  CBG (last 3)  Recent Labs    11/22/16 1709 11/22/16 2057 11/23/16 0629  GLUCAP 131* 116* 93     controlled on 11/20 12. Fluid overload/Chest pain: Has seen cardiology as an outpatient in the past.   Chest x-ray improved after Lasix.Ej fx 65% 13.  Low O2 sat asymptomatic- may need to check blood gas if resp sx occur 14.  Gout flareup, resolved resume allopurinol today, increased to 30056mad attack on 200m45mOS (Days) 11 A FACE TO FACE EVALUATION WAS PERFORMED  AndrCharlett Blake20/2018, 6:55 AM

## 2016-11-23 NOTE — Progress Notes (Signed)
Occupational Therapy Discharge Summary  Patient Details  Name: [name] [name] MRN: [id number] Date of Birth: 10/19/1955  Patient has met 10 of 10 long term goals due to improved activity tolerance, improved balance, postural control, ability to compensate for deficits, functional use of  RIGHT upper and RIGHT lower extremity, improved attention, improved awareness and improved coordination.  Patient to discharge at overall Modified Independent level with supervision level bathing and tub shower transfers.   Reasons goals not met: n/a  Recommendation:  Patient will benefit from ongoing skilled OT services in home health setting to continue to advance functional skills in the area of BADL.  Equipment: No equipment provided  Reasons for discharge: treatment goals met and discharge from hospital  Patient/family agrees with progress made and goals achieved: Yes  OT Discharge Precautions/Restrictions  Precautions Precautions: Fall Restrictions Weight Bearing Restrictions: No Pain  none/denies pain ADL ADL Eating: Modified independent Grooming: Modified independent Upper Body Bathing: Supervision/safety Lower Body Bathing: Supervision/safety Upper Body Dressing: Modified independent (Device) Lower Body Dressing: Modified independent Toileting: Modified independent Toilet Transfer: Modified independent Tub/Shower Transfer: Distant supervision Tub/Shower Transfer Method: Ambulating Perception  Perception: Impaired Comments: Continues to have some inattention to R, but improved since eval Praxis Praxis: Intact Cognition Arousal/Alertness: Awake/alert Orientation Level: Oriented X4 Safety/Judgment: Appears intact Sensation Sensation Light Touch: Appears Intact Hot/Cold: Appears Intact Proprioception: Appears Intact Coordination Coordination and Movement Description: gross and fine motor coordination improved since eval, R hand coordination continues to be slightly slower  than L Motor  Motor Motor - Discharge Observations: Increaesd R strength/coordination, still mild hemiplegis but improved since eval Mobility  Transfers Sit to Stand: 6: Modified independent (Device/Increase time) Stand to Sit: 6: Modified independent (Device/Increase time)  Balance Balance Balance Assessed: Yes Dynamic Sitting Balance Dynamic Sitting - Balance Support: During functional activity Dynamic Sitting - Level of Assistance: 6: Modified independent (Device/Increase time) Static Standing Balance Static Standing - Balance Support: During functional activity Static Standing - Level of Assistance: 6: Modified independent (Device/Increase time) Dynamic Standing Balance Dynamic Standing - Balance Support: During functional activity Dynamic Standing - Level of Assistance: 6: Modified independent (Device/Increase time) Extremity/Trunk Assessment RUE Assessment RUE Assessment: Exceptions to [facility] RUE Strength RUE Overall Strength Comments: 4/5 overall LUE Assessment LUE Assessment: Within Functional Limits   See Function Navigator for Current Functional Status.  Daneen Schick [name] [name] 11/23/2016, 3:00 PM

## 2016-11-23 NOTE — Progress Notes (Signed)
Occupational Therapy Session Note  Patient Details  Name: [name] [name] MRN: [id number] Date of Birth: 1955/01/23  Today's Date: 11/23/2016 OT Individual Time: 0930-1015 OT Individual Time Calculation (min): 45 min    Short Term Goals: Week 1:  OT Short Term Goal 1 (Week 1): Pt will stand at sink with MIN A for grooming 2/2 items to improve endurance OT Short Term Goal 1 - Progress (Week 1): Met OT Short Term Goal 2 (Week 1): Pt will stand to wash buttocks during bathing with touching A OT Short Term Goal 2 - Progress (Week 1): Met OT Short Term Goal 3 (Week 1): Pt will complete tub transfer/shower transfer wiht touching A OT Short Term Goal 3 - Progress (Week 1): Met OT Short Term Goal 4 (Week 1): Pt will complete commode/toilet transfer with touching A OT Short Term Goal 4 - Progress (Week 1): Met Week 2:  OT Short Term Goal 2 (Week 2): STG=LTG 2/2 ELOS  Skilled Therapeutic Interventions/Progress Updates:    1:1 Therapeutic activity:: focus on dynamic balance on Biodex with variable/ unstable surface. Pt with difficulty with weight shift to the left with dynamic surface ; implications in ambulation with picking up right foot. Performed limits of stability and weight shifts.    Performed Blocks and Box test performing: left 53 and right 37.   Also performed typing task on the computer with self reporting his performance at 70%.   2nd session 13:30-14:00 Continued work on cardiovascular endurance, transitional movements and core stabilization. Pt performed stepping up and down on 2 then 4 inch step and then progressing to squatting on the step and step forward off  and then back up on the block.  Transitioned down to floor mod I. Performed quadruped position with alternating limbs (2 at a time). Also performed prolonged stretching in sitting and in long sitting.  Left in gym to be handed off to next therapist  Therapy Documentation Precautions:  Precautions Precautions:  Fall Precaution Comments: (R knee and ankle pain.  h/o R ankle gout.  R knee buckles.) Restrictions Weight Bearing Restrictions: No Pain:  no c/o pain   See Function Navigator for Current Functional Status.   Therapy/Group: Individual Therapy  Willeen Cass [facility] 11/23/2016, 2:03 PM

## 2016-11-23 NOTE — Progress Notes (Signed)
Occupational Therapy Session Note  Patient Details  Name: [name] [name] MRN: [id number] Date of Birth: 04-16-1955  Today's Date: 11/23/2016 OT Individual Time: 1020-1115 OT Individual Time Calculation (min): 55 min   Short Term Goals: Week 2:  OT Short Term Goal 2 (Week 2): STG=LTG 2/2 ELOS  Skilled Therapeutic Interventions/Progress Updates:    OT treatment session focused on R hand fine motor coordination, R NMR. First practiced typing sentence from a book with pt having to scan to the R to read sentence, then alternate vision and attention back to keyboard. Progressed to sentence typing game focused on  Speed and accuracy. After 8 rounds of practice sentences, pt able to increase his words per minute consecutively to 28-30 with at or above 90% accuracy. Pt continues to have more errors with use of R hand, but is improving with repetition. Graded activity to focus on words created from home row keys, top row, then bottom row keys. Practiced stepping in/out of tub shower w/ supervision and educated on  Home modifications for safety within BADL tasks. Pt ambulated back to room Mod I and was made Mod I in his room. Pt left with needs met.   Therapy Documentation Precautions:  Precautions Precautions: Fall Precaution Comments: (R knee and ankle pain.  h/o R ankle gout.  R knee buckles.) Restrictions Weight Bearing Restrictions: No Pain: Pain Assessment Pain Assessment: No/denies pain  See Function Navigator for Current Functional Status.  Therapy/Group: Individual Therapy  Valma Cava 11/23/2016, 12:31 PM

## 2016-11-23 NOTE — Progress Notes (Signed)
Recreational Therapy Discharge Summary Patient Details  Name: [name] [name] MRN: [id number] Date of Birth: 1955-06-12 Today's Date: 11/23/2016  Comments on progress toward goals: Pt has made great progress toward goals and is ready for discharge home with wife at Mod I level.  Pt participated in community reintegration during LOS with supervision.  Education provided on safe community mobility with emphasis on attending to the right, energy conservation, activity analysis with potential adaptations.  Goals met.  Reasons for discharge: Goals met  Patient/family agrees with progress made and goals achieved: Yes  Burnis Kaser 11/23/2016, 2:49 PM

## 2016-11-23 NOTE — Discharge Instructions (Signed)
Inpatient Rehab Discharge Instructions  [name] [name] DischargeFortunato [name] date and time: 11/23/16  Activities/Precautions/ Functional Status: Activity: no lifting, driving, or strenuous exercise TILL CLEARED BY MD Diet: cardiac diet and diabetic diet Wound Care: none needed    Functional status:  ___ No restrictions     ___ Walk up steps independently ___ 24/7 supervision/assistance   ___ Walk up steps with assistance _X__ Intermittent supervision/assistance  _X__ Bathe/dress independently ___ Walk with walker     ___ Bathe/dress with assistance ___ Walk Independently    ___ Shower independently ___ Walk with assistance    ___ Shower with assistance _X__ No alcohol     ___ Return to work/school ________  Special Instructions: 1. Check blood sugars twice a day and record.    COMMUNITY REFERRALS UPON DISCHARGE:    Home Health:   PT & OT   Agency:[facility] Phone:[phone]   Date of last service:11/24/2016   Medical Equipment/Items Ordered:NO NEEDS   STROKE/TIA DISCHARGE INSTRUCTIONS SMOKING Cigarette smoking nearly doubles your risk of having a stroke & is the single most alterable risk factor  If you smoke or have smoked in the last 12 months, you are advised to quit smoking for your health.  Most of the excess cardiovascular risk related to smoking disappears within a year of stopping.  Ask you doctor about anti-smoking medications  [place] Quit Line: 1-800-QUIT NOW  Free Smoking Cessation Classes [phone]  CHOLESTEROL Know your levels; limit fat & cholesterol in your diet  Lipid Panel      Many patients benefit from treatment even if their cholesterol is at goal.  Goal: Total Cholesterol (CHOL) less than 160  Goal:  Triglycerides (TRIG) less than 150  Goal:  HDL greater than 40  Goal:  LDL (LDLCALC) less than 100   BLOOD PRESSURE American Stroke Association blood pressure target is less that 120/80 mm/Hg  Your discharge blood pressure is:  BP: 136/80   Monitor your blood pressure  Limit your salt and alcohol intake  Many individuals will require more than one medication for high blood pressure  DIABETES (A1c is a blood sugar average for last 3 months) Goal HGBA1c is under 7% (HBGA1c is blood sugar average for last 3 months)  Diabetes:     HGBA1C--   Your HGBA1c can be lowered with medications, healthy diet, and exercise.  Check your blood sugar as directed by your physician  Call your physician if you experience unexplained or low blood sugars.  PHYSICAL ACTIVITY/REHABILITATION Goal is 30 minutes at least 4 days per week  Activity: No driving, Therapies: see above Return to work: N/A  Activity decreases your risk of heart attack and stroke and makes your heart stronger.  It helps control your weight and blood pressure; helps you relax and can improve your mood.  Participate in a regular exercise program.  Talk with your doctor about the best form of exercise for you (dancing, walking, swimming, cycling).  DIET/WEIGHT Goal is to maintain a healthy weight  Your discharge diet is: DIET DYS 3 Room service appropriate? Yes; Fluid consistency: Thin  liquids Your height is:  Height: 5\' 10"  (177.8 cm) Your current weight is: Weight: 107.5 kg (237 lb) Your Body Mass Index (BMI) is:  BMI (Calculated): 34.01  Following the type of diet specifically designed for you will help prevent another stroke.  Your goal weight is: 174 lbs  Your goal Body Mass Index (BMI) is 19-24.  Healthy food habits can help reduce 3  risk factors for stroke:  High cholesterol, hypertension, and excess weight.  RESOURCES Stroke/Support Group:  Call [phone]   STROKE EDUCATION PROVIDED/REVIEWED AND GIVEN TO PATIENT Stroke warning signs and symptoms How to activate emergency medical system (call 911). Medications prescribed at discharge. Need for follow-up after discharge. Personal risk factors for stroke. Pneumonia vaccine given:  Flu vaccine given:   My questions have been answered, the writing is legible, and I understand these instructions.  I will adhere to these goals & educational materials that have been provided to me after my discharge from the hospital.       My questions have been answered and I understand these instructions. I will adhere to these goals and the provided educational materials after my discharge from the hospital.  Patient/Caregiver Signature _______________________________ Date __________  Clinician Signature _______________________________________ Date __________  Please bring this form and your medication list with you to all your follow-up doctor's appointments.

## 2016-11-26 DIAGNOSIS — I69351 Hemiplegia and hemiparesis following cerebral infarction affecting right dominant side: Secondary | ICD-10-CM | POA: Diagnosis not present

## 2016-11-26 DIAGNOSIS — I69398 Other sequelae of cerebral infarction: Secondary | ICD-10-CM | POA: Diagnosis not present

## 2016-11-26 DIAGNOSIS — M109 Gout, unspecified: Secondary | ICD-10-CM | POA: Diagnosis not present

## 2016-11-26 DIAGNOSIS — Z8673 Personal history of transient ischemic attack (TIA), and cerebral infarction without residual deficits: Secondary | ICD-10-CM | POA: Diagnosis not present

## 2016-11-26 DIAGNOSIS — I69391 Dysphagia following cerebral infarction: Secondary | ICD-10-CM | POA: Diagnosis not present

## 2016-11-26 DIAGNOSIS — I129 Hypertensive chronic kidney disease with stage 1 through stage 4 chronic kidney disease, or unspecified chronic kidney disease: Secondary | ICD-10-CM | POA: Diagnosis not present

## 2016-11-26 DIAGNOSIS — E1122 Type 2 diabetes mellitus with diabetic chronic kidney disease: Secondary | ICD-10-CM | POA: Diagnosis not present

## 2016-11-26 DIAGNOSIS — M6281 Muscle weakness (generalized): Secondary | ICD-10-CM | POA: Diagnosis not present

## 2016-11-26 DIAGNOSIS — E785 Hyperlipidemia, unspecified: Secondary | ICD-10-CM | POA: Diagnosis not present

## 2016-11-26 DIAGNOSIS — N183 Chronic kidney disease, stage 3 (moderate): Secondary | ICD-10-CM | POA: Diagnosis not present

## 2016-11-26 DIAGNOSIS — R1311 Dysphagia, oral phase: Secondary | ICD-10-CM | POA: Diagnosis not present

## 2016-11-26 DIAGNOSIS — Z7982 Long term (current) use of aspirin: Secondary | ICD-10-CM | POA: Diagnosis not present

## 2016-11-26 DIAGNOSIS — I69328 Other speech and language deficits following cerebral infarction: Secondary | ICD-10-CM | POA: Diagnosis not present

## 2016-11-29 DIAGNOSIS — E1165 Type 2 diabetes mellitus with hyperglycemia: Secondary | ICD-10-CM | POA: Diagnosis not present

## 2016-11-29 DIAGNOSIS — Z8673 Personal history of transient ischemic attack (TIA), and cerebral infarction without residual deficits: Secondary | ICD-10-CM | POA: Diagnosis not present

## 2016-11-29 DIAGNOSIS — Z6831 Body mass index (BMI) 31.0-31.9, adult: Secondary | ICD-10-CM | POA: Diagnosis not present

## 2016-11-29 DIAGNOSIS — I10 Essential (primary) hypertension: Secondary | ICD-10-CM | POA: Diagnosis not present

## 2016-11-30 DIAGNOSIS — M109 Gout, unspecified: Secondary | ICD-10-CM | POA: Diagnosis not present

## 2016-11-30 DIAGNOSIS — Z8673 Personal history of transient ischemic attack (TIA), and cerebral infarction without residual deficits: Secondary | ICD-10-CM | POA: Diagnosis not present

## 2016-11-30 DIAGNOSIS — E785 Hyperlipidemia, unspecified: Secondary | ICD-10-CM | POA: Diagnosis not present

## 2016-11-30 DIAGNOSIS — I129 Hypertensive chronic kidney disease with stage 1 through stage 4 chronic kidney disease, or unspecified chronic kidney disease: Secondary | ICD-10-CM | POA: Diagnosis not present

## 2016-11-30 DIAGNOSIS — I69328 Other speech and language deficits following cerebral infarction: Secondary | ICD-10-CM | POA: Diagnosis not present

## 2016-11-30 DIAGNOSIS — E1122 Type 2 diabetes mellitus with diabetic chronic kidney disease: Secondary | ICD-10-CM | POA: Diagnosis not present

## 2016-11-30 DIAGNOSIS — I69351 Hemiplegia and hemiparesis following cerebral infarction affecting right dominant side: Secondary | ICD-10-CM | POA: Diagnosis not present

## 2016-11-30 DIAGNOSIS — I69391 Dysphagia following cerebral infarction: Secondary | ICD-10-CM | POA: Diagnosis not present

## 2016-11-30 DIAGNOSIS — N183 Chronic kidney disease, stage 3 (moderate): Secondary | ICD-10-CM | POA: Diagnosis not present

## 2016-11-30 DIAGNOSIS — M6281 Muscle weakness (generalized): Secondary | ICD-10-CM | POA: Diagnosis not present

## 2016-11-30 DIAGNOSIS — I69398 Other sequelae of cerebral infarction: Secondary | ICD-10-CM | POA: Diagnosis not present

## 2016-11-30 DIAGNOSIS — Z7982 Long term (current) use of aspirin: Secondary | ICD-10-CM | POA: Diagnosis not present

## 2016-11-30 DIAGNOSIS — R1311 Dysphagia, oral phase: Secondary | ICD-10-CM | POA: Diagnosis not present

## 2016-12-02 DIAGNOSIS — Z8673 Personal history of transient ischemic attack (TIA), and cerebral infarction without residual deficits: Secondary | ICD-10-CM | POA: Diagnosis not present

## 2016-12-02 DIAGNOSIS — I129 Hypertensive chronic kidney disease with stage 1 through stage 4 chronic kidney disease, or unspecified chronic kidney disease: Secondary | ICD-10-CM | POA: Diagnosis not present

## 2016-12-02 DIAGNOSIS — M109 Gout, unspecified: Secondary | ICD-10-CM | POA: Diagnosis not present

## 2016-12-02 DIAGNOSIS — R1311 Dysphagia, oral phase: Secondary | ICD-10-CM | POA: Diagnosis not present

## 2016-12-02 DIAGNOSIS — M6281 Muscle weakness (generalized): Secondary | ICD-10-CM | POA: Diagnosis not present

## 2016-12-02 DIAGNOSIS — E1122 Type 2 diabetes mellitus with diabetic chronic kidney disease: Secondary | ICD-10-CM | POA: Diagnosis not present

## 2016-12-02 DIAGNOSIS — E785 Hyperlipidemia, unspecified: Secondary | ICD-10-CM | POA: Diagnosis not present

## 2016-12-02 DIAGNOSIS — I69398 Other sequelae of cerebral infarction: Secondary | ICD-10-CM | POA: Diagnosis not present

## 2016-12-02 DIAGNOSIS — I69328 Other speech and language deficits following cerebral infarction: Secondary | ICD-10-CM | POA: Diagnosis not present

## 2016-12-02 DIAGNOSIS — I69391 Dysphagia following cerebral infarction: Secondary | ICD-10-CM | POA: Diagnosis not present

## 2016-12-02 DIAGNOSIS — N183 Chronic kidney disease, stage 3 (moderate): Secondary | ICD-10-CM | POA: Diagnosis not present

## 2016-12-02 DIAGNOSIS — I69351 Hemiplegia and hemiparesis following cerebral infarction affecting right dominant side: Secondary | ICD-10-CM | POA: Diagnosis not present

## 2016-12-02 DIAGNOSIS — Z7982 Long term (current) use of aspirin: Secondary | ICD-10-CM | POA: Diagnosis not present

## 2016-12-07 DIAGNOSIS — I69391 Dysphagia following cerebral infarction: Secondary | ICD-10-CM | POA: Diagnosis not present

## 2016-12-07 DIAGNOSIS — I129 Hypertensive chronic kidney disease with stage 1 through stage 4 chronic kidney disease, or unspecified chronic kidney disease: Secondary | ICD-10-CM | POA: Diagnosis not present

## 2016-12-07 DIAGNOSIS — E1122 Type 2 diabetes mellitus with diabetic chronic kidney disease: Secondary | ICD-10-CM | POA: Diagnosis not present

## 2016-12-07 DIAGNOSIS — I69351 Hemiplegia and hemiparesis following cerebral infarction affecting right dominant side: Secondary | ICD-10-CM | POA: Diagnosis not present

## 2016-12-07 DIAGNOSIS — N183 Chronic kidney disease, stage 3 (moderate): Secondary | ICD-10-CM | POA: Diagnosis not present

## 2016-12-07 DIAGNOSIS — Z8673 Personal history of transient ischemic attack (TIA), and cerebral infarction without residual deficits: Secondary | ICD-10-CM | POA: Diagnosis not present

## 2016-12-07 DIAGNOSIS — I69328 Other speech and language deficits following cerebral infarction: Secondary | ICD-10-CM | POA: Diagnosis not present

## 2016-12-07 DIAGNOSIS — R1311 Dysphagia, oral phase: Secondary | ICD-10-CM | POA: Diagnosis not present

## 2016-12-07 DIAGNOSIS — M109 Gout, unspecified: Secondary | ICD-10-CM | POA: Diagnosis not present

## 2016-12-07 DIAGNOSIS — I69398 Other sequelae of cerebral infarction: Secondary | ICD-10-CM | POA: Diagnosis not present

## 2016-12-07 DIAGNOSIS — Z7982 Long term (current) use of aspirin: Secondary | ICD-10-CM | POA: Diagnosis not present

## 2016-12-07 DIAGNOSIS — M6281 Muscle weakness (generalized): Secondary | ICD-10-CM | POA: Diagnosis not present

## 2016-12-07 DIAGNOSIS — E785 Hyperlipidemia, unspecified: Secondary | ICD-10-CM | POA: Diagnosis not present

## 2016-12-09 DIAGNOSIS — I69391 Dysphagia following cerebral infarction: Secondary | ICD-10-CM | POA: Diagnosis not present

## 2016-12-09 DIAGNOSIS — Z8673 Personal history of transient ischemic attack (TIA), and cerebral infarction without residual deficits: Secondary | ICD-10-CM | POA: Diagnosis not present

## 2016-12-09 DIAGNOSIS — I69398 Other sequelae of cerebral infarction: Secondary | ICD-10-CM | POA: Diagnosis not present

## 2016-12-09 DIAGNOSIS — E785 Hyperlipidemia, unspecified: Secondary | ICD-10-CM | POA: Diagnosis not present

## 2016-12-09 DIAGNOSIS — M109 Gout, unspecified: Secondary | ICD-10-CM | POA: Diagnosis not present

## 2016-12-09 DIAGNOSIS — I69351 Hemiplegia and hemiparesis following cerebral infarction affecting right dominant side: Secondary | ICD-10-CM | POA: Diagnosis not present

## 2016-12-09 DIAGNOSIS — I129 Hypertensive chronic kidney disease with stage 1 through stage 4 chronic kidney disease, or unspecified chronic kidney disease: Secondary | ICD-10-CM | POA: Diagnosis not present

## 2016-12-09 DIAGNOSIS — R1311 Dysphagia, oral phase: Secondary | ICD-10-CM | POA: Diagnosis not present

## 2016-12-09 DIAGNOSIS — E1122 Type 2 diabetes mellitus with diabetic chronic kidney disease: Secondary | ICD-10-CM | POA: Diagnosis not present

## 2016-12-09 DIAGNOSIS — N183 Chronic kidney disease, stage 3 (moderate): Secondary | ICD-10-CM | POA: Diagnosis not present

## 2016-12-09 DIAGNOSIS — I69328 Other speech and language deficits following cerebral infarction: Secondary | ICD-10-CM | POA: Diagnosis not present

## 2016-12-09 DIAGNOSIS — Z7982 Long term (current) use of aspirin: Secondary | ICD-10-CM | POA: Diagnosis not present

## 2016-12-09 DIAGNOSIS — M6281 Muscle weakness (generalized): Secondary | ICD-10-CM | POA: Diagnosis not present

## 2016-12-10 ENCOUNTER — Ambulatory Visit: Payer: BLUE CROSS/BLUE SHIELD | Admitting: Physical Medicine & Rehabilitation

## 2016-12-10 ENCOUNTER — Encounter: Payer: Self-pay | Admitting: Physical Medicine & Rehabilitation

## 2016-12-10 ENCOUNTER — Encounter: Payer: BLUE CROSS/BLUE SHIELD | Attending: Physical Medicine & Rehabilitation

## 2016-12-10 VITALS — BP 185/86 | HR 72

## 2016-12-10 DIAGNOSIS — R269 Unspecified abnormalities of gait and mobility: Secondary | ICD-10-CM | POA: Diagnosis not present

## 2016-12-10 DIAGNOSIS — I69398 Other sequelae of cerebral infarction: Secondary | ICD-10-CM | POA: Insufficient documentation

## 2016-12-10 DIAGNOSIS — I69351 Hemiplegia and hemiparesis following cerebral infarction affecting right dominant side: Secondary | ICD-10-CM | POA: Diagnosis not present

## 2016-12-10 DIAGNOSIS — E669 Obesity, unspecified: Secondary | ICD-10-CM | POA: Insufficient documentation

## 2016-12-10 DIAGNOSIS — E119 Type 2 diabetes mellitus without complications: Secondary | ICD-10-CM | POA: Diagnosis not present

## 2016-12-10 DIAGNOSIS — Z8249 Family history of ischemic heart disease and other diseases of the circulatory system: Secondary | ICD-10-CM | POA: Insufficient documentation

## 2016-12-10 DIAGNOSIS — R488 Other symbolic dysfunctions: Secondary | ICD-10-CM | POA: Diagnosis not present

## 2016-12-10 DIAGNOSIS — G479 Sleep disorder, unspecified: Secondary | ICD-10-CM | POA: Diagnosis not present

## 2016-12-10 DIAGNOSIS — I10 Essential (primary) hypertension: Secondary | ICD-10-CM | POA: Insufficient documentation

## 2016-12-10 NOTE — Patient Instructions (Signed)

## 2016-12-10 NOTE — Progress Notes (Signed)
Subjective:    Patient ID: [name] CurlingJon M Dehoyos, male    DOB: Aug 21, 1955, 61 y.o.   MRN: [id number] 61 year old male with history of HTN, gout, prior CVA, medication non-compliance who was [facility] with right sided weakness, inability to walk, fall, word finding deficits and right facial droop.  BP reported to be 240/120 per EMS with question of AKI with BUN/Scr 24/1.4.  He was started on  IVF for hydration. MRI brain done revealing acute left internal capsule lacunar infarct and subacute small left frontal white matter infarct and old left> right basal ganglia infarcts and mild parenchymal volume loss. Cardiac echo done showing EF 55-60% with moderate to severe AR and diastolic dysfunction.  Low dose ASA recommended for thrombotic stroke due to small vessel disease. Hospital course significant for labile blood pressures, new diagnosis of diabetes and SOB due to fluid overload. Patient with resultant left facial weakness, left sided weakness with balance deficits affecting mobility and ability to carry out ADLs  Admit date: 11/12/2016 Discharge date: 11/23/2016  HPI Completed Inpt rehab, still has HHPT for 1 wk Pt notices some loss Seen by PCP Dr Jeanie Seweredding, BPs are elevated in MD office but not at home, CBGs ~120s Dr. Reading started [place] [employer] return to driving form.  Wished to have rehab input.  Speech improving not quite back to normal. 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  He travels by car to 9 different restaurants all within a 30-minute driving range Pain Inventory Average Pain 0 Pain Right Now 0 My pain is na  In the last 24 hours, has pain interfered with the following? General activity 0 Relation with others 0 Enjoyment of life 0 What TIME of day is your pain at its worst? na Sleep (in general) Fair  Pain is worse with: na Pain improves with: na Relief from Meds: n  Mobility walk without assistance  Function employed # of hrs/week  50  Neuro/Psych No problems in this area  Prior Studies Any changes since last visit?  no  Physicians involved in your care Any changes since last visit?  no   Family History  Problem Relation Age of Onset  . Hypertension Mother   . Hypertension Father   . Colon cancer Paternal Grandmother    Social History   Socioeconomic History  . Marital status: Married    Spouse name: Not on file  . Number of children: Not on file  . Years of education: Not on file  . Highest education level: Not on file  Social Needs  . Financial resource strain: Not on file  . Food insecurity - worry: Not on file  . Food insecurity - inability: Not on file  . Transportation needs - medical: Not on file  . Transportation needs - non-medical: Not on file  Occupational History  . Not on file  Tobacco Use  . Smoking status: Never Smoker  . Smokeless tobacco: Never Used  Substance and Sexual Activity  . Alcohol use: Yes  . Drug use: No  . Sexual activity: Not on file  Other Topics Concern  . Not on file  Social History Narrative   Pt ambulates in the hallway. He socialize with staff    Past Surgical History:  Procedure Laterality Date  . Arthroscopic knee surgery    . TONSILLECTOMY     Past Medical History:  Diagnosis Date  . CVA (cerebral vascular accident) (HCC) 2014  .  Hypertension   . Obesity (BMI 30.0-34.9)   . Sleep disorder    There were no vitals taken for this visit.  Opioid Risk Score:   Fall Risk Score:  `1  Depression screen PHQ 2/9  No flowsheet data found.   Review of Systems  Constitutional: Negative.   HENT: Negative.   Eyes: Negative.   Respiratory: Negative.   Cardiovascular: Negative.   Gastrointestinal: Negative.   Endocrine: Negative.   Genitourinary: Negative.   Musculoskeletal: Negative.   Skin: Negative.   Allergic/Immunologic: Negative.   Neurological: Negative.   Hematological: Negative.   Psychiatric/Behavioral: Negative.   All other  systems reviewed and are negative.      Objective:   Physical Exam  Constitutional: He is oriented to person, place, and time. He appears well-developed and well-nourished. No distress.  HENT:  Head: Normocephalic and atraumatic.  Eyes: Conjunctivae and EOM are normal. Pupils are equal, round, and reactive to light.  Neck: Normal range of motion.  Cardiovascular: Normal rate, regular rhythm and normal heart sounds. Exam reveals no friction rub.  No murmur heard. Pulmonary/Chest: Effort normal and breath sounds normal. No respiratory distress. He has no wheezes.  Abdominal: Soft. Bowel sounds are normal. He exhibits no distension. There is no tenderness.  Neurological: He is alert and oriented to person, place, and time.  Motor strength is 5/5 bilateral deltoid, bicep, tricep, grip, hip flexor, knee extensor, left ankle dorsiflexor, 4/5 right ankle dorsiflexor  Sensation intact to light touch bilateral upper and lower limbs There is normal finger to thumb opposition bilaterally in the upper extremities Finger-nose-finger is intact bilaterally Romberg is negative Patient is able to Heel Walk and Toe Walk as well as perform tandem gait Patient does have tendency toward foot drag on the right side but can be cued to avoid this.   Skin: He is not diaphoretic.  Psychiatric: He has a normal mood and affect. His behavior is normal. Judgment and thought content normal.  Nursing note and vitals reviewed.         Assessment & Plan:  #1.  Left frontal white matter and internal capsule infarct with residual right ankle dorsiflexor weakness which is mild.  He also has mild anomia.  He has some problems with his handwriting but this is improving. Overall he is had an excellent recovery and will not need outpatient therapy after home health is finished.  He may return to driving, he has already driven for last couple weeks locally according to patient and his wife, [place] from [place]  to [place] today.  She noted no issues. He should be able to return to work next week 12/14/2016.  Recommend no driving at night for the first 30 days, reevaluation physical medicine rehab in 1 month Follow-up with PCP for hypertension and diabetes Follow-up with neurology for secondary stroke prevention

## 2016-12-12 DIAGNOSIS — I69351 Hemiplegia and hemiparesis following cerebral infarction affecting right dominant side: Secondary | ICD-10-CM | POA: Diagnosis not present

## 2016-12-15 DIAGNOSIS — I69328 Other speech and language deficits following cerebral infarction: Secondary | ICD-10-CM | POA: Diagnosis not present

## 2016-12-15 DIAGNOSIS — I129 Hypertensive chronic kidney disease with stage 1 through stage 4 chronic kidney disease, or unspecified chronic kidney disease: Secondary | ICD-10-CM | POA: Diagnosis not present

## 2016-12-15 DIAGNOSIS — M109 Gout, unspecified: Secondary | ICD-10-CM | POA: Diagnosis not present

## 2016-12-15 DIAGNOSIS — E1122 Type 2 diabetes mellitus with diabetic chronic kidney disease: Secondary | ICD-10-CM | POA: Diagnosis not present

## 2016-12-15 DIAGNOSIS — I69351 Hemiplegia and hemiparesis following cerebral infarction affecting right dominant side: Secondary | ICD-10-CM | POA: Diagnosis not present

## 2016-12-15 DIAGNOSIS — I69398 Other sequelae of cerebral infarction: Secondary | ICD-10-CM | POA: Diagnosis not present

## 2016-12-15 DIAGNOSIS — E785 Hyperlipidemia, unspecified: Secondary | ICD-10-CM | POA: Diagnosis not present

## 2016-12-15 DIAGNOSIS — Z8673 Personal history of transient ischemic attack (TIA), and cerebral infarction without residual deficits: Secondary | ICD-10-CM | POA: Diagnosis not present

## 2016-12-15 DIAGNOSIS — R1311 Dysphagia, oral phase: Secondary | ICD-10-CM | POA: Diagnosis not present

## 2016-12-15 DIAGNOSIS — M6281 Muscle weakness (generalized): Secondary | ICD-10-CM | POA: Diagnosis not present

## 2016-12-15 DIAGNOSIS — Z7982 Long term (current) use of aspirin: Secondary | ICD-10-CM | POA: Diagnosis not present

## 2016-12-15 DIAGNOSIS — N183 Chronic kidney disease, stage 3 (moderate): Secondary | ICD-10-CM | POA: Diagnosis not present

## 2016-12-15 DIAGNOSIS — I69391 Dysphagia following cerebral infarction: Secondary | ICD-10-CM | POA: Diagnosis not present

## 2016-12-17 DIAGNOSIS — I69328 Other speech and language deficits following cerebral infarction: Secondary | ICD-10-CM | POA: Diagnosis not present

## 2016-12-17 DIAGNOSIS — Z8673 Personal history of transient ischemic attack (TIA), and cerebral infarction without residual deficits: Secondary | ICD-10-CM | POA: Diagnosis not present

## 2016-12-17 DIAGNOSIS — I129 Hypertensive chronic kidney disease with stage 1 through stage 4 chronic kidney disease, or unspecified chronic kidney disease: Secondary | ICD-10-CM | POA: Diagnosis not present

## 2016-12-17 DIAGNOSIS — Z7982 Long term (current) use of aspirin: Secondary | ICD-10-CM | POA: Diagnosis not present

## 2016-12-17 DIAGNOSIS — M6281 Muscle weakness (generalized): Secondary | ICD-10-CM | POA: Diagnosis not present

## 2016-12-17 DIAGNOSIS — I69391 Dysphagia following cerebral infarction: Secondary | ICD-10-CM | POA: Diagnosis not present

## 2016-12-17 DIAGNOSIS — E1122 Type 2 diabetes mellitus with diabetic chronic kidney disease: Secondary | ICD-10-CM | POA: Diagnosis not present

## 2016-12-17 DIAGNOSIS — I69398 Other sequelae of cerebral infarction: Secondary | ICD-10-CM | POA: Diagnosis not present

## 2016-12-17 DIAGNOSIS — M109 Gout, unspecified: Secondary | ICD-10-CM | POA: Diagnosis not present

## 2016-12-17 DIAGNOSIS — E785 Hyperlipidemia, unspecified: Secondary | ICD-10-CM | POA: Diagnosis not present

## 2016-12-17 DIAGNOSIS — R1311 Dysphagia, oral phase: Secondary | ICD-10-CM | POA: Diagnosis not present

## 2016-12-17 DIAGNOSIS — N183 Chronic kidney disease, stage 3 (moderate): Secondary | ICD-10-CM | POA: Diagnosis not present

## 2016-12-17 DIAGNOSIS — I69351 Hemiplegia and hemiparesis following cerebral infarction affecting right dominant side: Secondary | ICD-10-CM | POA: Diagnosis not present

## 2016-12-20 DIAGNOSIS — Z6831 Body mass index (BMI) 31.0-31.9, adult: Secondary | ICD-10-CM | POA: Diagnosis not present

## 2016-12-20 DIAGNOSIS — Z125 Encounter for screening for malignant neoplasm of prostate: Secondary | ICD-10-CM | POA: Diagnosis not present

## 2016-12-20 DIAGNOSIS — M109 Gout, unspecified: Secondary | ICD-10-CM | POA: Diagnosis not present

## 2016-12-20 DIAGNOSIS — E7800 Pure hypercholesterolemia, unspecified: Secondary | ICD-10-CM | POA: Diagnosis not present

## 2016-12-20 DIAGNOSIS — I10 Essential (primary) hypertension: Secondary | ICD-10-CM | POA: Diagnosis not present

## 2016-12-20 DIAGNOSIS — N189 Chronic kidney disease, unspecified: Secondary | ICD-10-CM | POA: Diagnosis not present

## 2016-12-24 ENCOUNTER — Other Ambulatory Visit: Payer: Self-pay | Admitting: Physical Medicine and Rehabilitation

## 2017-01-01 ENCOUNTER — Other Ambulatory Visit: Payer: Self-pay | Admitting: Physical Medicine and Rehabilitation

## 2017-01-17 DIAGNOSIS — Z6830 Body mass index (BMI) 30.0-30.9, adult: Secondary | ICD-10-CM | POA: Diagnosis not present

## 2017-01-17 DIAGNOSIS — N189 Chronic kidney disease, unspecified: Secondary | ICD-10-CM | POA: Diagnosis not present

## 2017-01-17 DIAGNOSIS — I10 Essential (primary) hypertension: Secondary | ICD-10-CM | POA: Diagnosis not present

## 2017-01-17 DIAGNOSIS — K5900 Constipation, unspecified: Secondary | ICD-10-CM | POA: Diagnosis not present

## 2017-01-21 DIAGNOSIS — N281 Cyst of kidney, acquired: Secondary | ICD-10-CM | POA: Diagnosis not present

## 2017-01-21 DIAGNOSIS — I701 Atherosclerosis of renal artery: Secondary | ICD-10-CM | POA: Diagnosis not present

## 2017-02-07 DIAGNOSIS — N189 Chronic kidney disease, unspecified: Secondary | ICD-10-CM | POA: Diagnosis not present

## 2017-02-07 DIAGNOSIS — Z6830 Body mass index (BMI) 30.0-30.9, adult: Secondary | ICD-10-CM | POA: Diagnosis not present

## 2017-02-07 DIAGNOSIS — I10 Essential (primary) hypertension: Secondary | ICD-10-CM | POA: Diagnosis not present

## 2017-02-07 DIAGNOSIS — E7800 Pure hypercholesterolemia, unspecified: Secondary | ICD-10-CM | POA: Diagnosis not present

## 2017-02-17 DIAGNOSIS — M25511 Pain in right shoulder: Secondary | ICD-10-CM | POA: Diagnosis not present

## 2017-02-17 DIAGNOSIS — I10 Essential (primary) hypertension: Secondary | ICD-10-CM | POA: Diagnosis not present

## 2017-02-17 DIAGNOSIS — Z6831 Body mass index (BMI) 31.0-31.9, adult: Secondary | ICD-10-CM | POA: Diagnosis not present

## 2017-02-17 DIAGNOSIS — S91301A Unspecified open wound, right foot, initial encounter: Secondary | ICD-10-CM | POA: Diagnosis not present

## 2017-02-18 DIAGNOSIS — M7581 Other shoulder lesions, right shoulder: Secondary | ICD-10-CM | POA: Diagnosis not present

## 2017-03-03 ENCOUNTER — Other Ambulatory Visit: Payer: Self-pay

## 2017-03-03 ENCOUNTER — Encounter: Payer: Self-pay | Admitting: Vascular Surgery

## 2017-03-03 ENCOUNTER — Encounter: Payer: Self-pay | Admitting: *Deleted

## 2017-03-03 ENCOUNTER — Other Ambulatory Visit: Payer: Self-pay | Admitting: *Deleted

## 2017-03-03 ENCOUNTER — Ambulatory Visit: Payer: BLUE CROSS/BLUE SHIELD | Admitting: Vascular Surgery

## 2017-03-03 VITALS — BP 173/92 | HR 65 | Temp 97.9°F | Resp 18 | Ht 70.0 in | Wt 206.0 lb

## 2017-03-03 DIAGNOSIS — I701 Atherosclerosis of renal artery: Secondary | ICD-10-CM

## 2017-03-03 NOTE — H&P (View-Only) (Signed)
Referring Physician: Dr. Jeanie Sewer  Patient name: [name] [name] MRN: [id number] DOB: 1955/10/04 Sex: male  REASON FOR CONSULT: Left renal artery stenosis   HPI: [name] [name] is a 62 y.o. male,  With chronic hypertension requiring multiple anti hypertensive medications.   He recently had a left CVA in November 2018.  Post stroke work up included alternant causes of his hypertension.  A renal ultrasound was performed and indicated that he has > 60 % left renal artery stenosis.  He also has CKD  With a Cr of 1.4 on lab work dated 02/07/2017.    Other medical problems include : hypercholesterolemia, CVA right sided weakness, HTN.  He is on 4 medications for his hypertension to include Labetalol, Lisinopril, Metoprolol and Clonidine.  His BP is still > 170 systolic.    Past Medical History:  Diagnosis Date  . CVA (cerebral vascular accident) (HCC) 2014  . Hypertension   . Obesity (BMI 30.0-34.9)   . Sleep disorder    Past Surgical History:  Procedure Laterality Date  . Arthroscopic knee surgery    . TONSILLECTOMY      Family History  Problem Relation Age of Onset  . Hypertension Mother   . Hypertension Father   . Colon cancer Paternal Grandmother     SOCIAL HISTORY: Social History   Socioeconomic History  . Marital status: Married    Spouse name: Not on file  . Number of children: Not on file  . Years of education: Not on file  . Highest education level: Not on file  Social Needs  . Financial resource strain: Not on file  . Food insecurity - worry: Not on file  . Food insecurity - inability: Not on file  . Transportation needs - medical: Not on file  . Transportation needs - non-medical: Not on file  Occupational History  . Not on file  Tobacco Use  . Smoking status: Never Smoker  . Smokeless tobacco: Never Used  Substance and Sexual Activity  . Alcohol use: Yes  . Drug use: No  . Sexual activity: Not on file  Other Topics Concern  . Not on file  Social History  Narrative   Pt ambulates in the hallway. He socialize with staff     No Known Allergies  Current Outpatient Medications  Medication Sig Dispense Refill  . allopurinol (ZYLOPRIM) 300 MG tablet Take 1 tablet (300 mg total) by mouth daily. 30 tablet 0  . amLODipine (NORVASC) 10 MG tablet Take 1 tablet (10 mg total) by mouth daily. 30 tablet 0  . aspirin EC 81 MG tablet Take 81 mg daily by mouth.    . cloNIDine (CATAPRES) 0.1 MG tablet Take 0.2 mg by mouth 2 (two) times daily.     Marland Kitchen glimepiride (AMARYL) 1 MG tablet Take 1 tablet (1 mg total) by mouth daily with breakfast. 30 tablet 0  . labetalol (NORMODYNE) 200 MG tablet     . lisinopril (PRINIVIL,ZESTRIL) 20 MG tablet Take 1 tablet (20 mg total) by mouth daily. 30 tablet 0  . metoprolol succinate (TOPROL-XL) 100 MG 24 hr tablet     . atorvastatin (LIPITOR) 20 MG tablet Take 1 tablet (20 mg total) by mouth daily. 30 tablet 0   No current facility-administered medications for this visit.     ROS:   General:  No weight loss, Fever, chills  HEENT: No recent headaches, no nasal bleeding, no visual changes, no sore throat Aphasia post stroke  Neurologic: No dizziness,  blackouts, seizures.  recent symptoms of stroke or mini- stroke. No recent episodes of slurred speech, or temporary blindness.  Cardiac: No recent episodes of chest pain/pressure, no shortness of breath at rest.  No shortness of breath with exertion.  Denies history of atrial fibrillation or irregular heartbeat  Vascular: No history of rest pain in feet.  No history of claudication.  No history of non-healing ulcer, No history of DVT   Pulmonary: No home oxygen, no productive cough, no hemoptysis,  No asthma or wheezing  Musculoskeletal:  [ ]  Arthritis, [ ]  Low back pain,  [ ]  Joint pain  Hematologic:No history of hypercoagulable state.  No history of easy bleeding.  No history of anemia  Gastrointestinal: No hematochezia or melena,  No gastroesophageal reflux, no  trouble swallowing  Urinary: [ ]  chronic Kidney disease, [ ]  on HD - [ ]  MWF or [ ]  TTHS, [ ]  Burning with urination, [ ]  Frequent urination, [ ]  Difficulty urinating;   Skin: No rashes  Psychological: No history of anxiety,  No history of depression   Physical Examination  Vitals:   03/03/17 1302 03/03/17 1309  BP: (!) 177/91 (!) 173/92  Pulse: 65 65  Resp: 18   Temp: 97.9 F (36.6 C)   TempSrc: Oral   SpO2: 97%   Weight: 206 lb (93.4 kg)   Height: 5\' 10"  (1.778 m)     Body mass index is 29.56 kg/m.  General:  Alert and oriented, no acute distress HEENT: Normal Neck: No bruit or JVD Pulmonary: Clear to auscultation bilaterally Cardiac: Regular Rate and Rhythm without murmur Abdomen: Soft, non-tender, non-distended, no mass, no scars, no bruit Skin: No rash Extremity Pulses:  2+ radial, brachial, femoral, dorsalis  posterior tibial pulses bilaterally Musculoskeletal: No deformity or edema  Neurologic: Upper and lower extremity motor 5/5 lefgt UE/LE, 4=/5 right UE/LE  DATA:   Renal artery [facility]  Left renal artery > 60 % stensosi  ASSESSMENT:   Renal artery stenosis   PLAN:   He has uncontrolled chronic hypertension and is on 4 antihypertensive medications. He has newly diagnosed CKD, and recent CVA.  The ultrasounds suggest renal artery stenosis.  This is enough evidence based work to warrant an angiogram and possible intervention for the renal artery stenosis.    Our goal is to help with his uncontrolled hypertension or at least help decrease the need for multiple antihypertensive medications.   [name] PA-C [facility] [place]  The patient was seen in conjunction with Dr. [name] today  History and exam details as above.  Patient has duplex evidence of a left renal artery stenosis.  He is on 5 oral blood pressure medications with a systolic pressure still running in the 170s at home.  He states he  has been compliant with his medications.  He has had some mild decline in renal function but certainly is nowhere near needing dialysis.  I believe we need to perform a renal angiogram to determine whether or not he actually has a stenosis after his duplex exam.  I believe he meets criteria for an intervention on his renal artery if we do find a greater than 70% stenosis.  I did discuss with the patient today the risk benefits possible complications and procedure details of renal angioplasty and stenting.  Indications for this would be his poor control despite multiple medications and some decline in renal function.  I also discussed with him that we may  see some improvement in his blood pressure but may not necessarily cure this but hopefully preserve long-term renal function.  He understands and agrees to proceed.  This is scheduled for March 11, 2017.  Fabienne Bruns, MD [facility] [place] Office: [phone] Pager: [phone]

## 2017-03-03 NOTE — Progress Notes (Deleted)
Referring Physician: ***  Patient name: [name] [name] MRN: [id number] DOB: [birth year] Sex: male  REASON FOR CONSULT: ***  HPI: [name] [name] is a 62 y.o. male,  *** Other medical problems include   Past Medical History:  Diagnosis Date  . CVA (cerebral vascular accident) (HCC) 2014  . Hypertension   . Obesity (BMI 30.0-34.9)   . Sleep disorder    Past Surgical History:  Procedure Laterality Date  . Arthroscopic knee surgery    . TONSILLECTOMY      Family History  Problem Relation Age of Onset  . Hypertension Mother   . Hypertension Father   . Colon cancer Paternal Grandmother     SOCIAL HISTORY: Social History   Socioeconomic History  . Marital status: Married    Spouse name: Not on file  . Number of children: Not on file  . Years of education: Not on file  . Highest education level: Not on file  Social Needs  . Financial resource strain: Not on file  . Food insecurity - worry: Not on file  . Food insecurity - inability: Not on file  . Transportation needs - medical: Not on file  . Transportation needs - non-medical: Not on file  Occupational History  . Not on file  Tobacco Use  . Smoking status: Never Smoker  . Smokeless tobacco: Never Used  Substance and Sexual Activity  . Alcohol use: Yes  . Drug use: No  . Sexual activity: Not on file  Other Topics Concern  . Not on file  Social History Narrative   Pt ambulates in the hallway. He socialize with staff     No Known Allergies  Current Outpatient Medications  Medication Sig Dispense Refill  . allopurinol (ZYLOPRIM) 300 MG tablet Take 1 tablet (300 mg total) by mouth daily. 30 tablet 0  . amLODipine (NORVASC) 10 MG tablet Take 1 tablet (10 mg total) by mouth daily. 30 tablet 0  . aspirin EC 81 MG tablet Take 81 mg daily by mouth.    Marland Kitchen atorvastatin (LIPITOR) 20 MG tablet Take 1 tablet (20 mg total) by mouth daily. 30 tablet 0  . glimepiride (AMARYL) 1 MG tablet Take 1 tablet (1 mg total) by mouth  daily with breakfast. 30 tablet 0  . labetalol (NORMODYNE) 200 MG tablet     . lisinopril (PRINIVIL,ZESTRIL) 20 MG tablet Take 1 tablet (20 mg total) by mouth daily. 30 tablet 0   No current facility-administered medications for this visit.     ROS:   General:  No weight loss, Fever, chills  HEENT: No recent headaches, no nasal bleeding, no visual changes, no sore throat  Neurologic: No dizziness, blackouts, seizures. No recent symptoms of stroke or mini- stroke. No recent episodes of slurred speech, or temporary blindness.  Cardiac: No recent episodes of chest pain/pressure, no shortness of breath at rest.  No shortness of breath with exertion.  Denies history of atrial fibrillation or irregular heartbeat  Vascular: No history of rest pain in feet.  No history of claudication.  No history of non-healing ulcer, No history of DVT   Pulmonary: No home oxygen, no productive cough, no hemoptysis,  No asthma or wheezing  Musculoskeletal:  [{Blank single:19197::" ","X"}] Arthritis, [{Blank single:19197::" ","X"}] Low back pain,  [{Blank single:19197::" ","X"}] Joint pain  Hematologic:No history of hypercoagulable state.  No history of easy bleeding.  No history of anemia  Gastrointestinal: No hematochezia or melena,  No gastroesophageal reflux, no trouble swallowing  Urinary: [{Blank single:19197::" ","X"}] chronic Kidney disease, [{Blank single:19197::" ","X"}] on HD - [{Blank single:19197::" ","X"}] MWF or [{Blank single:19197::" ","X"}] TTHS, [ ]  Burning with urination, [ ]  Frequent urination, [ ]  Difficulty urinating;   Skin: No rashes  Psychological: No history of anxiety,  No history of depression   Physical Examination  There were no vitals filed for this visit.  There is no height or weight on file to calculate BMI.  General:  Alert and oriented, no acute distress HEENT: Normal Neck: No bruit or JVD Pulmonary: Clear to auscultation bilaterally Cardiac: Regular Rate and  Rhythm without murmur Abdomen: Soft, non-tender, non-distended, no mass, no scars Skin: No rash Extremity Pulses:  2+ radial, brachial, femoral, dorsalis pedis, posterior tibial pulses bilaterally Musculoskeletal: No deformity or edema  Neurologic: Upper and lower extremity motor 5/5 and symmetric  DATA:  ***  ASSESSMENT:  ***   PLAN:  ***   Fabienne Brunsharles Burgandy Hackworth, MD [facility] [place] Office: [phone] Pager: [phone]

## 2017-03-03 NOTE — Progress Notes (Signed)
Referring Physician: Dr. Jeanie Sewer  Patient name: [name] [name] MRN: [id number] DOB: 1955/10/04 Sex: male  REASON FOR CONSULT: Left renal artery stenosis   HPI: [name] [name] is a 62 y.o. male,  With chronic hypertension requiring multiple anti hypertensive medications.   He recently had a left CVA in November 2018.  Post stroke work up included alternant causes of his hypertension.  A renal ultrasound was performed and indicated that he has > 60 % left renal artery stenosis.  He also has CKD  With a Cr of 1.4 on lab work dated 02/07/2017.    Other medical problems include : hypercholesterolemia, CVA right sided weakness, HTN.  He is on 4 medications for his hypertension to include Labetalol, Lisinopril, Metoprolol and Clonidine.  His BP is still > 170 systolic.    Past Medical History:  Diagnosis Date  . CVA (cerebral vascular accident) (HCC) 2014  . Hypertension   . Obesity (BMI 30.0-34.9)   . Sleep disorder    Past Surgical History:  Procedure Laterality Date  . Arthroscopic knee surgery    . TONSILLECTOMY      Family History  Problem Relation Age of Onset  . Hypertension Mother   . Hypertension Father   . Colon cancer Paternal Grandmother     SOCIAL HISTORY: Social History   Socioeconomic History  . Marital status: Married    Spouse name: Not on file  . Number of children: Not on file  . Years of education: Not on file  . Highest education level: Not on file  Social Needs  . Financial resource strain: Not on file  . Food insecurity - worry: Not on file  . Food insecurity - inability: Not on file  . Transportation needs - medical: Not on file  . Transportation needs - non-medical: Not on file  Occupational History  . Not on file  Tobacco Use  . Smoking status: Never Smoker  . Smokeless tobacco: Never Used  Substance and Sexual Activity  . Alcohol use: Yes  . Drug use: No  . Sexual activity: Not on file  Other Topics Concern  . Not on file  Social History  Narrative   Pt ambulates in the hallway. He socialize with staff     No Known Allergies  Current Outpatient Medications  Medication Sig Dispense Refill  . allopurinol (ZYLOPRIM) 300 MG tablet Take 1 tablet (300 mg total) by mouth daily. 30 tablet 0  . amLODipine (NORVASC) 10 MG tablet Take 1 tablet (10 mg total) by mouth daily. 30 tablet 0  . aspirin EC 81 MG tablet Take 81 mg daily by mouth.    . cloNIDine (CATAPRES) 0.1 MG tablet Take 0.2 mg by mouth 2 (two) times daily.     Marland Kitchen glimepiride (AMARYL) 1 MG tablet Take 1 tablet (1 mg total) by mouth daily with breakfast. 30 tablet 0  . labetalol (NORMODYNE) 200 MG tablet     . lisinopril (PRINIVIL,ZESTRIL) 20 MG tablet Take 1 tablet (20 mg total) by mouth daily. 30 tablet 0  . metoprolol succinate (TOPROL-XL) 100 MG 24 hr tablet     . atorvastatin (LIPITOR) 20 MG tablet Take 1 tablet (20 mg total) by mouth daily. 30 tablet 0   No current facility-administered medications for this visit.     ROS:   General:  No weight loss, Fever, chills  HEENT: No recent headaches, no nasal bleeding, no visual changes, no sore throat Aphasia post stroke  Neurologic: No dizziness,  blackouts, seizures.  recent symptoms of stroke or mini- stroke. No recent episodes of slurred speech, or temporary blindness.  Cardiac: No recent episodes of chest pain/pressure, no shortness of breath at rest.  No shortness of breath with exertion.  Denies history of atrial fibrillation or irregular heartbeat  Vascular: No history of rest pain in feet.  No history of claudication.  No history of non-healing ulcer, No history of DVT   Pulmonary: No home oxygen, no productive cough, no hemoptysis,  No asthma or wheezing  Musculoskeletal:  [ ]  Arthritis, [ ]  Low back pain,  [ ]  Joint pain  Hematologic:No history of hypercoagulable state.  No history of easy bleeding.  No history of anemia  Gastrointestinal: No hematochezia or melena,  No gastroesophageal reflux, no  trouble swallowing  Urinary: [ ]  chronic Kidney disease, [ ]  on HD - [ ]  MWF or [ ]  TTHS, [ ]  Burning with urination, [ ]  Frequent urination, [ ]  Difficulty urinating;   Skin: No rashes  Psychological: No history of anxiety,  No history of depression   Physical Examination  Vitals:   03/03/17 1302 03/03/17 1309  BP: (!) 177/91 (!) 173/92  Pulse: 65 65  Resp: 18   Temp: 97.9 F (36.6 C)   TempSrc: Oral   SpO2: 97%   Weight: 206 lb (93.4 kg)   Height: 5\' 10"  (1.778 m)     Body mass index is 29.56 kg/m.  General:  Alert and oriented, no acute distress HEENT: Normal Neck: No bruit or JVD Pulmonary: Clear to auscultation bilaterally Cardiac: Regular Rate and Rhythm without murmur Abdomen: Soft, non-tender, non-distended, no mass, no scars, no bruit Skin: No rash Extremity Pulses:  2+ radial, brachial, femoral, dorsalis  posterior tibial pulses bilaterally Musculoskeletal: No deformity or edema  Neurologic: Upper and lower extremity motor 5/5 lefgt UE/LE, 4=/5 right UE/LE  DATA:   Renal artery [facility]  Left renal artery > 60 % stensosi  ASSESSMENT:   Renal artery stenosis   PLAN:   He has uncontrolled chronic hypertension and is on 4 antihypertensive medications. He has newly diagnosed CKD, and recent CVA.  The ultrasounds suggest renal artery stenosis.  This is enough evidence based work to warrant an angiogram and possible intervention for the renal artery stenosis.    Our goal is to help with his uncontrolled hypertension or at least help decrease the need for multiple antihypertensive medications.   [name] PA-C [facility] [place]  The patient was seen in conjunction with Dr. [name] today  History and exam details as above.  Patient has duplex evidence of a left renal artery stenosis.  He is on 5 oral blood pressure medications with a systolic pressure still running in the 170s at home.  He states he  has been compliant with his medications.  He has had some mild decline in renal function but certainly is nowhere near needing dialysis.  I believe we need to perform a renal angiogram to determine whether or not he actually has a stenosis after his duplex exam.  I believe he meets criteria for an intervention on his renal artery if we do find a greater than 70% stenosis.  I did discuss with the patient today the risk benefits possible complications and procedure details of renal angioplasty and stenting.  Indications for this would be his poor control despite multiple medications and some decline in renal function.  I also discussed with him that we may  see some improvement in his blood pressure but may not necessarily cure this but hopefully preserve long-term renal function.  He understands and agrees to proceed.  This is scheduled for March 11, 2017.  Fabienne Bruns, MD [facility] [place] Office: [phone] Pager: [phone]

## 2017-03-03 NOTE — Progress Notes (Signed)
Vitals:   03/03/17 1302  BP: (!) 177/91  Pulse: 65  Resp: 18  Temp: 97.9 F (36.6 C)  TempSrc: Oral  SpO2: 97%  Weight: 206 lb (93.4 kg)  Height: 5\' 10"  (1.778 m)

## 2017-03-09 ENCOUNTER — Other Ambulatory Visit: Payer: Self-pay | Admitting: Physical Medicine and Rehabilitation

## 2017-03-11 ENCOUNTER — Observation Stay (HOSPITAL_COMMUNITY)
Admission: RE | Admit: 2017-03-11 | Discharge: 2017-03-12 | Disposition: A | Discharge status: Home / Self Care | Payer: BLUE CROSS/BLUE SHIELD | Source: Ambulatory Visit | Attending: Vascular Surgery | Admitting: Vascular Surgery

## 2017-03-11 ENCOUNTER — Encounter (HOSPITAL_COMMUNITY)
Admission: RE | Disposition: A | Discharge status: Home / Self Care | Payer: Self-pay | Source: Ambulatory Visit | Attending: Vascular Surgery

## 2017-03-11 DIAGNOSIS — Z7982 Long term (current) use of aspirin: Secondary | ICD-10-CM | POA: Diagnosis not present

## 2017-03-11 DIAGNOSIS — E7800 Pure hypercholesterolemia, unspecified: Secondary | ICD-10-CM | POA: Insufficient documentation

## 2017-03-11 DIAGNOSIS — N189 Chronic kidney disease, unspecified: Secondary | ICD-10-CM | POA: Insufficient documentation

## 2017-03-11 DIAGNOSIS — I129 Hypertensive chronic kidney disease with stage 1 through stage 4 chronic kidney disease, or unspecified chronic kidney disease: Secondary | ICD-10-CM | POA: Diagnosis not present

## 2017-03-11 DIAGNOSIS — E669 Obesity, unspecified: Secondary | ICD-10-CM | POA: Insufficient documentation

## 2017-03-11 DIAGNOSIS — Z6829 Body mass index (BMI) 29.0-29.9, adult: Secondary | ICD-10-CM | POA: Insufficient documentation

## 2017-03-11 DIAGNOSIS — I701 Atherosclerosis of renal artery: Principal | ICD-10-CM | POA: Insufficient documentation

## 2017-03-11 DIAGNOSIS — Z7984 Long term (current) use of oral hypoglycemic drugs: Secondary | ICD-10-CM | POA: Diagnosis not present

## 2017-03-11 DIAGNOSIS — I739 Peripheral vascular disease, unspecified: Secondary | ICD-10-CM | POA: Diagnosis present

## 2017-03-11 DIAGNOSIS — I69351 Hemiplegia and hemiparesis following cerebral infarction affecting right dominant side: Secondary | ICD-10-CM | POA: Insufficient documentation

## 2017-03-11 HISTORY — PX: RENAL ANGIOGRAPHY: CATH118260

## 2017-03-11 HISTORY — PX: PERIPHERAL VASCULAR INTERVENTION: CATH118257

## 2017-03-11 LAB — GLUCOSE, CAPILLARY
GLUCOSE-CAPILLARY: 101 mg/dL — AB (ref 65–99)
Glucose-Capillary: 241 mg/dL — ABNORMAL HIGH (ref 65–99)

## 2017-03-11 LAB — CBC
HEMATOCRIT: 39.8 % (ref 39.0–52.0)
HEMOGLOBIN: 13.4 g/dL (ref 13.0–17.0)
MCH: 29.8 pg (ref 26.0–34.0)
MCHC: 33.7 g/dL (ref 30.0–36.0)
MCV: 88.6 fL (ref 78.0–100.0)
Platelets: 170 10*3/uL (ref 150–400)
RBC: 4.49 MIL/uL (ref 4.22–5.81)
RDW: 14.3 % (ref 11.5–15.5)
WBC: 8.6 10*3/uL (ref 4.0–10.5)

## 2017-03-11 LAB — POCT I-STAT, CHEM 8
BUN: 31 mg/dL — AB (ref 6–20)
CREATININE: 1.1 mg/dL (ref 0.61–1.24)
Calcium, Ion: 1.12 mmol/L — ABNORMAL LOW (ref 1.15–1.40)
Chloride: 108 mmol/L (ref 101–111)
Glucose, Bld: 109 mg/dL — ABNORMAL HIGH (ref 65–99)
HEMATOCRIT: 44 % (ref 39.0–52.0)
Hemoglobin: 15 g/dL (ref 13.0–17.0)
POTASSIUM: 4.6 mmol/L (ref 3.5–5.1)
SODIUM: 142 mmol/L (ref 135–145)
TCO2: 29 mmol/L (ref 22–32)

## 2017-03-11 LAB — CREATININE, SERUM
Creatinine, Ser: 1.16 mg/dL (ref 0.61–1.24)
GFR calc Af Amer: >60 mL/min (ref >60)
GFR calc non Af Amer: >60 mL/min (ref >60)

## 2017-03-11 LAB — POCT ACTIVATED CLOTTING TIME
Activated Clotting Time: 213 seconds
Activated Clotting Time: 235 seconds
Activated Clotting Time: 169 seconds

## 2017-03-11 SURGERY — RENAL ANGIOGRAPHY
Anesthesia: LOCAL | Laterality: Right

## 2017-03-11 MED ORDER — CLOPIDOGREL BISULFATE 300 MG PO TABS
ORAL_TABLET | ORAL | Status: AC
  Filled 2017-03-11: qty 1

## 2017-03-11 MED ORDER — POTASSIUM CHLORIDE CRYS ER 20 MEQ PO TBCR: 20.0000 meq | EXTENDED_RELEASE_TABLET | Freq: Once | ORAL | Status: DC

## 2017-03-11 MED ORDER — FENTANYL CITRATE (PF) 100 MCG/2ML IJ SOLN
INTRAMUSCULAR | Status: AC
  Filled 2017-03-11: qty 2

## 2017-03-11 MED ORDER — LABETALOL HCL 200 MG PO TABS
200.0000 mg | ORAL_TABLET | Freq: Two times a day (BID) | ORAL | Status: DC
  Administered 2017-03-12: 09:00:00 200 mg via ORAL
  Filled 2017-03-11 (×2): qty 1

## 2017-03-11 MED ORDER — LISINOPRIL 10 MG PO TABS
20.0000 mg | ORAL_TABLET | Freq: Every day | ORAL | Status: DC
  Administered 2017-03-11 – 2017-03-12 (×2): 20 mg via ORAL
  Filled 2017-03-11 (×2): qty 2

## 2017-03-11 MED ORDER — MORPHINE SULFATE (PF) 10 MG/ML IV SOLN
2.0000 mg | INTRAVENOUS | Status: DC | PRN
  Administered 2017-03-11: 2 mg via INTRAVENOUS

## 2017-03-11 MED ORDER — HEPARIN (PORCINE) IN NACL 2-0.9 UNIT/ML-% IJ SOLN
INTRAMUSCULAR | Status: AC
  Filled 2017-03-11: qty 1000

## 2017-03-11 MED ORDER — ASPIRIN EC 81 MG PO TBEC
81.0000 mg | DELAYED_RELEASE_TABLET | Freq: Every day | ORAL | Status: DC
  Administered 2017-03-12: 81 mg via ORAL
  Filled 2017-03-11: qty 1

## 2017-03-11 MED ORDER — SODIUM CHLORIDE 0.9% FLUSH: 3.0000 mL | INTRAVENOUS | Status: DC | PRN

## 2017-03-11 MED ORDER — ANGIOPLASTY BOOK
Freq: Once | Status: AC
  Administered 2017-03-11: 1
  Filled 2017-03-11: qty 1

## 2017-03-11 MED ORDER — HEPARIN SODIUM (PORCINE) 1000 UNIT/ML IJ SOLN
INTRAMUSCULAR | Status: AC
  Filled 2017-03-11: qty 1

## 2017-03-11 MED ORDER — LORAZEPAM 1 MG PO TABS
2.0000 mg | ORAL_TABLET | Freq: Once | ORAL | Status: AC
  Administered 2017-03-11: 2 mg via ORAL
  Filled 2017-03-11: qty 2

## 2017-03-11 MED ORDER — OXYCODONE HCL 5 MG PO TABS: 5.0000 mg | ORAL_TABLET | ORAL | Status: DC | PRN

## 2017-03-11 MED ORDER — LABETALOL HCL 200 MG PO TABS
200.0000 mg | ORAL_TABLET | ORAL | Status: AC
  Administered 2017-03-11: 200 mg via ORAL
  Filled 2017-03-11: qty 1

## 2017-03-11 MED ORDER — GUAIFENESIN-DM 100-10 MG/5ML PO SYRP: 15.0000 mL | ORAL_SOLUTION | ORAL | Status: DC | PRN

## 2017-03-11 MED ORDER — CLOPIDOGREL BISULFATE 75 MG PO TABS: 75.0000 mg | ORAL_TABLET | Freq: Every day | ORAL | 11 refills | Status: DC

## 2017-03-11 MED ORDER — LIDOCAINE HCL (PF) 1 % IJ SOLN
INTRAMUSCULAR | Status: DC | PRN
  Administered 2017-03-11: 15 mL

## 2017-03-11 MED ORDER — DOCUSATE SODIUM 100 MG PO CAPS
100.0000 mg | ORAL_CAPSULE | Freq: Two times a day (BID) | ORAL | Status: DC
  Administered 2017-03-11: 21:00:00 100 mg via ORAL
  Filled 2017-03-11 (×2): qty 1

## 2017-03-11 MED ORDER — HYDRALAZINE HCL 20 MG/ML IJ SOLN
INTRAMUSCULAR | Status: DC | PRN
  Administered 2017-03-11: 10 mg via INTRAVENOUS

## 2017-03-11 MED ORDER — ONDANSETRON HCL 4 MG/2ML IJ SOLN: 4.0000 mg | Freq: Four times a day (QID) | INTRAMUSCULAR | Status: DC | PRN

## 2017-03-11 MED ORDER — ALLOPURINOL 300 MG PO TABS
300.0000 mg | ORAL_TABLET | Freq: Every day | ORAL | Status: DC
  Administered 2017-03-11 – 2017-03-12 (×2): 300 mg via ORAL
  Filled 2017-03-11 (×2): qty 1

## 2017-03-11 MED ORDER — MORPHINE SULFATE (PF) 4 MG/ML IV SOLN
INTRAVENOUS | Status: AC
  Filled 2017-03-11: qty 1

## 2017-03-11 MED ORDER — LABETALOL HCL 5 MG/ML IV SOLN
INTRAVENOUS | Status: DC | PRN
  Administered 2017-03-11: 10 mg via INTRAVENOUS

## 2017-03-11 MED ORDER — CLONIDINE HCL 0.2 MG PO TABS
0.2000 mg | ORAL_TABLET | ORAL | Status: AC
Start: 2017-03-11 — End: 2017-03-11
  Administered 2017-03-11: 0.2 mg via ORAL
  Filled 2017-03-11: qty 1

## 2017-03-11 MED ORDER — ATORVASTATIN CALCIUM 20 MG PO TABS
20.0000 mg | ORAL_TABLET | Freq: Every day | ORAL | Status: DC
  Administered 2017-03-11 – 2017-03-12 (×2): 20 mg via ORAL
  Filled 2017-03-11 (×2): qty 1

## 2017-03-11 MED ORDER — ALUM & MAG HYDROXIDE-SIMETH 200-200-20 MG/5ML PO SUSP: 15.0000 mL | ORAL | Status: DC | PRN

## 2017-03-11 MED ORDER — SODIUM CHLORIDE 0.9 % IV SOLN: 250.0000 mL | INTRAVENOUS | Status: DC | PRN

## 2017-03-11 MED ORDER — HYDRALAZINE HCL 20 MG/ML IJ SOLN
5.0000 mg | INTRAMUSCULAR | Status: DC | PRN
  Administered 2017-03-11: 5 mg via INTRAVENOUS

## 2017-03-11 MED ORDER — BISACODYL 5 MG PO TBEC: 5.0000 mg | DELAYED_RELEASE_TABLET | Freq: Every day | ORAL | Status: DC | PRN

## 2017-03-11 MED ORDER — CLONIDINE HCL 0.1 MG PO TABS
0.2000 mg | ORAL_TABLET | Freq: Two times a day (BID) | ORAL | Status: DC
  Administered 2017-03-12: 09:00:00 0.2 mg via ORAL
  Filled 2017-03-11: qty 2

## 2017-03-11 MED ORDER — LIDOCAINE HCL 1 % IJ SOLN
INTRAMUSCULAR | Status: AC
  Filled 2017-03-11: qty 20

## 2017-03-11 MED ORDER — HYDRALAZINE HCL 20 MG/ML IJ SOLN
INTRAMUSCULAR | Status: AC
  Filled 2017-03-11: qty 1

## 2017-03-11 MED ORDER — HEPARIN SODIUM (PORCINE) 1000 UNIT/ML IJ SOLN
INTRAMUSCULAR | Status: DC | PRN
  Administered 2017-03-11: 3000 [IU] via INTRAVENOUS
  Administered 2017-03-11: 7000 [IU] via INTRAVENOUS

## 2017-03-11 MED ORDER — SODIUM CHLORIDE 0.9% FLUSH: 3.0000 mL | Freq: Two times a day (BID) | INTRAVENOUS | Status: DC

## 2017-03-11 MED ORDER — SODIUM CHLORIDE 0.9 % IV SOLN
INTRAVENOUS | Status: AC
  Administered 2017-03-11: 100 mL/h via INTRAVENOUS

## 2017-03-11 MED ORDER — MAGNESIUM 250 MG PO TABS: 1.0000 | ORAL_TABLET | Freq: Every day | ORAL | Status: DC

## 2017-03-11 MED ORDER — PHENOL 1.4 % MT LIQD: 1.0000 | OROMUCOSAL | Status: DC | PRN

## 2017-03-11 MED ORDER — HYDRALAZINE HCL 20 MG/ML IJ SOLN: 5.0000 mg | INTRAMUSCULAR | Status: DC | PRN

## 2017-03-11 MED ORDER — CLOPIDOGREL BISULFATE 75 MG PO TABS
300.0000 mg | ORAL_TABLET | Freq: Once | ORAL | Status: AC
  Administered 2017-03-11: 300 mg via ORAL

## 2017-03-11 MED ORDER — AMLODIPINE BESYLATE 10 MG PO TABS
10.0000 mg | ORAL_TABLET | Freq: Every day | ORAL | Status: DC
  Administered 2017-03-11 – 2017-03-12 (×2): 10 mg via ORAL
  Filled 2017-03-11 (×2): qty 1

## 2017-03-11 MED ORDER — LABETALOL HCL 5 MG/ML IV SOLN
INTRAVENOUS | Status: AC
  Filled 2017-03-11: qty 4

## 2017-03-11 MED ORDER — GLIMEPIRIDE 1 MG PO TABS
1.0000 mg | ORAL_TABLET | Freq: Every day | ORAL | Status: DC
  Administered 2017-03-12: 1 mg via ORAL
  Filled 2017-03-11: qty 1

## 2017-03-11 MED ORDER — MORPHINE SULFATE (PF) 2 MG/ML IV SOLN: 2.0000 mg | INTRAVENOUS | Status: DC | PRN

## 2017-03-11 MED ORDER — LABETALOL HCL 5 MG/ML IV SOLN
10.0000 mg | INTRAVENOUS | Status: DC | PRN
  Administered 2017-03-11 (×2): 10 mg via INTRAVENOUS

## 2017-03-11 MED ORDER — MIDAZOLAM HCL 2 MG/2ML IJ SOLN
INTRAMUSCULAR | Status: DC | PRN
  Administered 2017-03-11: 2 mg via INTRAVENOUS

## 2017-03-11 MED ORDER — MIDAZOLAM HCL 2 MG/2ML IJ SOLN
INTRAMUSCULAR | Status: AC
  Filled 2017-03-11: qty 2

## 2017-03-11 MED ORDER — PANTOPRAZOLE SODIUM 40 MG PO TBEC
40.0000 mg | DELAYED_RELEASE_TABLET | Freq: Every day | ORAL | Status: DC
  Administered 2017-03-11: 21:00:00 40 mg via ORAL
  Filled 2017-03-11 (×2): qty 1

## 2017-03-11 MED ORDER — HEPARIN (PORCINE) IN NACL 2-0.9 UNIT/ML-% IJ SOLN
INTRAMUSCULAR | Status: AC | PRN
  Administered 2017-03-11: 1000 mL

## 2017-03-11 MED ORDER — METOPROLOL TARTRATE 5 MG/5ML IV SOLN: 2.0000 mg | INTRAVENOUS | Status: DC | PRN

## 2017-03-11 MED ORDER — MAGNESIUM OXIDE 400 (241.3 MG) MG PO TABS
200.0000 mg | ORAL_TABLET | Freq: Every day | ORAL | Status: DC
  Administered 2017-03-11 – 2017-03-12 (×2): 200 mg via ORAL
  Filled 2017-03-11 (×2): qty 1

## 2017-03-11 MED ORDER — FENTANYL CITRATE (PF) 100 MCG/2ML IJ SOLN
INTRAMUSCULAR | Status: DC | PRN
  Administered 2017-03-11: 25 ug via INTRAVENOUS

## 2017-03-11 MED ORDER — CLONIDINE HCL 0.1 MG PO TABS
0.2000 mg | ORAL_TABLET | ORAL | Status: DC | PRN
  Administered 2017-03-11 (×2): 0.2 mg via ORAL
  Filled 2017-03-11 (×2): qty 1

## 2017-03-11 MED ORDER — IODIXANOL 320 MG/ML IV SOLN
INTRAVENOUS | Status: DC | PRN
  Administered 2017-03-11: 130 mL via INTRA_ARTERIAL

## 2017-03-11 MED ORDER — CLOPIDOGREL BISULFATE 75 MG PO TABS
75.0000 mg | ORAL_TABLET | Freq: Every day | ORAL | Status: DC
  Administered 2017-03-12: 09:00:00 75 mg via ORAL
  Filled 2017-03-11: qty 1

## 2017-03-11 MED ORDER — LABETALOL HCL 200 MG PO TABS: 200.0000 mg | ORAL_TABLET | Freq: Once | ORAL | Status: DC

## 2017-03-11 MED ORDER — CLOPIDOGREL BISULFATE 300 MG PO TABS
ORAL_TABLET | ORAL | Status: DC | PRN
  Administered 2017-03-11: 300 mg via ORAL

## 2017-03-11 MED ORDER — OXYCODONE-ACETAMINOPHEN 5-325 MG PO TABS: 1.0000 | ORAL_TABLET | ORAL | Status: DC | PRN

## 2017-03-11 MED ORDER — SENNOSIDES-DOCUSATE SODIUM 8.6-50 MG PO TABS
1.0000 | ORAL_TABLET | Freq: Every evening | ORAL | Status: DC | PRN
Start: 2017-03-11 — End: 2017-03-12

## 2017-03-11 MED ORDER — SODIUM CHLORIDE 0.9 % IV SOLN
INTRAVENOUS | Status: DC
  Administered 2017-03-11: 08:00:00 via INTRAVENOUS

## 2017-03-11 MED ORDER — METOPROLOL SUCCINATE ER 50 MG PO TB24
100.0000 mg | ORAL_TABLET | Freq: Every day | ORAL | Status: DC
  Administered 2017-03-11 – 2017-03-12 (×2): 100 mg via ORAL
  Filled 2017-03-11 (×2): qty 2

## 2017-03-11 MED ORDER — ENOXAPARIN SODIUM 40 MG/0.4ML ~~LOC~~ SOLN: 30.0000 mg | SUBCUTANEOUS | Status: DC

## 2017-03-11 SURGICAL SUPPLY — 19 items
BALLN VIATRAC 6X15X135 (BALLOONS) ×4
BALLOON VIATRAC 6X15X135 (BALLOONS) ×3 IMPLANT
CATH ANGIO 5F PIGTAIL 65CM (CATHETERS) ×4 IMPLANT
CATH SOS OMNI O 5F 80CM (CATHETERS) ×4 IMPLANT
COVER PRB 48X5XTLSCP FOLD TPE (BAG) ×3 IMPLANT
COVER PROBE 5X48 (BAG) ×1
GUIDE CATH VISTA JR4 6F (CATHETERS) ×4 IMPLANT
KIT ENCORE 26 ADVANTAGE (KITS) ×4 IMPLANT
KIT PV (KITS) ×4 IMPLANT
SHEATH PINNACLE 6F 10CM (SHEATH) ×4 IMPLANT
STENT HERCULINK RX 5.5X12X135 (Permanent Stent) ×4 IMPLANT
STOPCOCK MORSE 400PSI 3WAY (MISCELLANEOUS) ×4 IMPLANT
SYR MEDRAD MARK V 150ML (SYRINGE) ×4 IMPLANT
TRANSDUCER W/STOPCOCK (MISCELLANEOUS) ×4 IMPLANT
TRAY PV CATH (CUSTOM PROCEDURE TRAY) ×4 IMPLANT
TUBING CIL FLEX 10 FLL-RA (TUBING) ×4 IMPLANT
WIRE BENTSON .035X145CM (WIRE) ×4 IMPLANT
WIRE ROSEN-J .035X180CM (WIRE) ×4 IMPLANT
WIRE STABILIZER XS .014X180CM (WIRE) ×4 IMPLANT

## 2017-03-11 NOTE — Progress Notes (Signed)
Dr [name] out to the OR. Pt resting in HA until procedure can be performed.

## 2017-03-11 NOTE — Progress Notes (Signed)
Site area: Right groin a 6 french arterial sheath was removed  Site Prior to Removal:  Level 0  Pressure Applied For 25 MINUTES    Bedrest Beginning at 1610p  Manual:   Yes.    Patient Status During Pull:  stable  Post Pull Groin Site:  Level 0  Post Pull Instructions Given:  Yes.    Post Pull Pulses Present:  Yes.    Dressing Applied:  Yes.    Comments:  BP remain elevated 217/119

## 2017-03-11 NOTE — Op Note (Addendum)
Procedure: Renal angiogram, selective right renal angiogram primary stent right renal artery  Preoperative diagnosis: Renal artery hypertension  Postoperative diagnosis: Same  Anesthesia: Local with IV sedation  Operative findings: #1 widely patent left renal artery number 275% stenosis origin right renal artery stented to 0 residual stenosis with 5.5 x 12 mm balloon expandable stent postdilated to 6 mm  Operative details: After obtaining informed consent, the patient was brought to the PV lab.  The patient was placed in supine position Angio table.  Both groins were prepped and draped in usual sterile fashion.  Local anesthesia was infiltrated over the right common femoral artery.  Ultrasound was used to identify the right common femoral artery and femoral bifurcation.  Using ultrasound guidance the right common femoral artery was successfully cannulated and an 035 Bentson wire threaded up in the abdominal aorta under fluoroscopic guidance.  Next a 6 French sheath was placed over the guidewire in the right common femoral artery and this was thoroughly flushed with heparinized saline.  A 5 French pigtail catheter was then advanced over the guidewire and an abdominal aortogram focusing on the renal arteries was obtained.  The left and right renal arteries are patent.  However there is a suggestion of an origin stenosis on the right side.  In order to further define this I pulled the pigtail catheter down adjacent to the renal origins and magnified views in a 15 degree RAO and LAO projection were performed.  This confirmed a 75% stenosis at the origin of the right renal artery.  The left renal artery is widely patent.  At this point it was decided to intervene on the right renal artery stenosis.  The 5 French pig tail catheter was exchanged for a 5 [place] sauce catheter.  I then used this to selectively catheterize the right renal origin.  I was able to advance the guidewire into the right renal artery but  could not get enough purchase to advance the catheter into the artery enough to get a stable platform.  Therefore the sauce catheter was removed and exchanged for a JR4 guide catheter.  I was then able to advance the 035 Bentson wire out into the right renal artery.  The tortuosity made this fairly difficult.  I was able to get the catheter out a few millimeters into the right renal artery and then the Bentson wire was exchanged for an 014 stabilizer wire.  The patient had been given a total of 10,000 units of IV heparin and ACT was confirmed to be greater than 250.  A 5.5 x 12 mm balloon expandable stent was brought up and centered on the lesion extending slightly into the renal origin.  This was then deployed to nominal pressure for 1 minute.  Completion angiogram was performed which showed no dissection or evidence of perforation.  The distal end of the stent was not completely opposed to the wall and I wanted to make the renal origin slightly wider and strengthen this.  Therefore a 6 x 18 angioplasty balloon was brought up in the operative field centered on the lesion and inflated to nominal pressure for 1 minute.  Completion angiogram showed good apposition of the stent except for the very distal edge.  The origin of the right renal artery was now widely patent with 0 residual stenosis.  At this point the stabilizer wire and guide catheter were removed.  The 6 French sheath was left in place to be pulled in the holding area when ACT is  less than 175.  The patient tolerated procedure well and there were no complications.  The patient was taken to the holding area in stable condition.  Operative management: The patient had successful deployment of a right renal stent with 0 residual stenosis.  He now has widely patent renal arteries bilaterally.  We will schedule him for a follow-up renal duplex exam in about 3 months.  Fabienne Bruns, MD [facility] [place] Office:  [phone] Pager: [phone]

## 2017-03-11 NOTE — Progress Notes (Signed)
Pt to procedure via stretcher

## 2017-03-11 NOTE — Interval H&P Note (Signed)
History and Physical Interval Note:  03/11/2017 8:04 AM  [name] [name]  has presented today for surgery, with the diagnosis of renal artery stenosis  The various methods of treatment have been discussed with the patient and family. After consideration of risks, benefits and other options for treatment, the patient has consented to  Procedure(s): ABDOMINAL AORTOGRAM W/LOWER EXTREMITY (N/A) RENAL ANGIOGRAPHY (N/A) as a surgical intervention .  The patient's history has been reviewed, patient examined, no change in status, stable for surgery.  I have reviewed the patient's chart and labs.  Questions were answered to the patient's satisfaction.     Fabienne Brunsharles Fields

## 2017-03-11 NOTE — Progress Notes (Addendum)
Dr [name] called and update and orders given to admitted pt due to elevated BP.  PA called.

## 2017-03-12 ENCOUNTER — Encounter (HOSPITAL_COMMUNITY): Payer: Self-pay

## 2017-03-12 ENCOUNTER — Other Ambulatory Visit: Payer: Self-pay

## 2017-03-12 DIAGNOSIS — I129 Hypertensive chronic kidney disease with stage 1 through stage 4 chronic kidney disease, or unspecified chronic kidney disease: Secondary | ICD-10-CM | POA: Diagnosis not present

## 2017-03-12 DIAGNOSIS — Z7984 Long term (current) use of oral hypoglycemic drugs: Secondary | ICD-10-CM | POA: Diagnosis not present

## 2017-03-12 DIAGNOSIS — I739 Peripheral vascular disease, unspecified: Secondary | ICD-10-CM | POA: Diagnosis not present

## 2017-03-12 DIAGNOSIS — Z6829 Body mass index (BMI) 29.0-29.9, adult: Secondary | ICD-10-CM | POA: Diagnosis not present

## 2017-03-12 DIAGNOSIS — E7800 Pure hypercholesterolemia, unspecified: Secondary | ICD-10-CM | POA: Diagnosis not present

## 2017-03-12 DIAGNOSIS — I701 Atherosclerosis of renal artery: Secondary | ICD-10-CM | POA: Diagnosis not present

## 2017-03-12 DIAGNOSIS — N189 Chronic kidney disease, unspecified: Secondary | ICD-10-CM | POA: Diagnosis not present

## 2017-03-12 DIAGNOSIS — E669 Obesity, unspecified: Secondary | ICD-10-CM | POA: Diagnosis not present

## 2017-03-12 DIAGNOSIS — I69351 Hemiplegia and hemiparesis following cerebral infarction affecting right dominant side: Secondary | ICD-10-CM | POA: Diagnosis not present

## 2017-03-12 DIAGNOSIS — Z7982 Long term (current) use of aspirin: Secondary | ICD-10-CM | POA: Diagnosis not present

## 2017-03-12 LAB — GLUCOSE, CAPILLARY: Glucose-Capillary: 122 mg/dL — ABNORMAL HIGH (ref 65–99)

## 2017-03-12 LAB — HIV ANTIBODY (ROUTINE TESTING W REFLEX): HIV Screen 4th Generation wRfx: NONREACTIVE

## 2017-03-12 NOTE — Progress Notes (Signed)
[facility] [place]  Subjective  - Doing well sitting up in chair eating breakfast.   Objective (!) 147/80 69 97.7 F (36.5 C) (Oral) (!) 24 97%  Intake/Output Summary (Last 24 hours) at 03/12/2017 0734 Last data filed at 03/11/2017 2012 Gross per 24 hour  Intake -  Output 1300 ml  Net -1300 ml   Palpable pedal pulses B Right groin soft without hematoma Heart RRR Lungs non labored breathing BP 147/80 this am   Assessment/Planning: POD # 1 Right renal 5.5 x 12 mm balloon expandable stent postdilated to 6 mm  Uncontrolled chronic hypertension with multiple medications, right renal artery stenosis.   75% stenosis at the origin of the right renal artery pre procedure. Post procedure hypertensive >200 systolic patient medicated with IV hydralazine, labetalol, and catapres.  We admitted him over night for observation.  At the time of admission his systolic pressure was 124.      Disposition stable.  Will ambulate prior to going home today.  Systolic 147 baseline.  F/U with Dr. [name] in our office.    [name] Maureen Krystalynn Ridgeway 03/12/2017 7:34 AM --  Laboratory Lab Results: Recent Labs    03/11/17 0744 03/11/17 1943  WBC  --  8.6  HGB 15.0 13.4  HCT 44.0 39.8  PLT  --  170   BMET Recent Labs    03/11/17 0744 03/11/17 1943  NA 142  --   K 4.6  --   CL 108  --   GLUCOSE 109*  --   BUN 31*  --   CREATININE 1.10 1.16    COAG No results found for: INR, PROTIME No results found for: PTT

## 2017-03-14 ENCOUNTER — Encounter (HOSPITAL_COMMUNITY): Payer: Self-pay | Admitting: Vascular Surgery

## 2017-03-14 ENCOUNTER — Telehealth: Payer: Self-pay | Admitting: Vascular Surgery

## 2017-03-14 NOTE — Telephone Encounter (Signed)
LVM for pt RE 06/16/17 appt.

## 2017-03-14 NOTE — Telephone Encounter (Signed)
-----   Message from Sharee PimpleMarilyn K McChesney, RN sent at 03/11/2017  1:32 PM EST ----- Regarding: 3 monts with renal duplex   ----- Message ----- From: [name], [name], MD Sent: 03/11/2017  11:24 AM To: Vvs Charge Pool  Procedure: Renal angiogram, selective right renal angiogram primary stent right renal artery    We will schedule him for a follow-up renal duplex exam in about 3 months.  He can see Rosalita ChessmanSuzanne at that visit  Fabienne Brunsharles Fields, MD [facility] [place] Office: [phone] Pager: [phone]

## 2017-03-15 NOTE — Discharge Summary (Signed)
[facility] Discharge Summary   Patient ID:  [name] [name] MRN: [id number] DOB/AGE: 06-05-1955 62 y.o.  Admit date: 03/11/2017 Discharge date: 03/12/2017 Date of Surgery: 03/11/2017 Surgeon: Surgeon(s): Fields, Janetta Hora, MD  Admission Diagnosis: PAD (peripheral artery disease) [facility]) [I73.9]  Discharge Diagnoses:  PAD (peripheral artery disease) (HCC) [I73.9]  Secondary Diagnoses: Past Medical History:  Diagnosis Date  . CVA (cerebral vascular accident) (HCC) 2014  . Hypertension   . Obesity (BMI 30.0-34.9)   . Sleep disorder     Procedure(s): RENAL ANGIOGRAPHY Abdominal Aortagram PERIPHERAL VASCULAR INTERVENTION  Discharged Condition: good  HPI: 62 y/o male was found to have left renal artery stenosis > 75 %, CKD and uncontrolled hypertension on multiple anti hypertensive medications.    This is enough evidence based work to warrant an angiogram and possible intervention for the renal artery stenosis.      Hospital Course:  [name] [name] is a 62 y.o. male is S/P Left Procedure(s): RENAL ANGIOGRAPHY Abdominal Aortagram PERIPHERAL VASCULAR INTERVENTION  Angiogram discovered widely patent left renal artery with > 75% right renal artery stenosis.  Therefore He proceeded to stent the right renal artery.    Assessment/Planning: POD # 1 Right renal 5.5 x 12 mm balloon expandable stent postdilated to 6 mm  Uncontrolled chronic hypertension with multiple medications, right renal artery stenosis.  75% stenosis at the origin of the right renal artery pre procedure. Post procedure hypertensive >200 systolic patient medicated with IV hydralazine, labetalol, and catapres.  We admitted him over night for observation.  At the time of admission his systolic pressure was 124.                           Disposition stable.  Will ambulate prior to going home today.  Systolic 147 baseline.  F/U with Dr. Darrick Penna in our office.       Significant Diagnostic  Studies: CBC Lab Results  Component Value Date   WBC 8.6 03/11/2017   HGB 13.4 03/11/2017   HCT 39.8 03/11/2017   MCV 88.6 03/11/2017   PLT 170 03/11/2017    BMET    Component Value Date/Time   NA 142 03/11/2017 0744   NA 145 (H) 10/14/2016 0948   K 4.6 03/11/2017 0744   CL 108 03/11/2017 0744   CO2 28 11/22/2016 0734   GLUCOSE 109 (H) 03/11/2017 0744   BUN 31 (H) 03/11/2017 0744   BUN 23 10/14/2016 0948   CREATININE 1.16 03/11/2017 1943   CALCIUM 9.2 11/22/2016 0734   GFRNONAA >60 03/11/2017 1943   GFRAA >60 03/11/2017 1943   COAG No results found for: INR, PROTIME   Disposition:  Discharge to :Home Discharge Instructions    Call MD for:  redness, tenderness, or signs of infection (pain, swelling, bleeding, redness, odor or green/yellow discharge around incision site)   Complete by:  As directed    Call MD for:  severe or increased pain, loss or decreased feeling  in affected limb(s)   Complete by:  As directed    Call MD for:  temperature >100.5   Complete by:  As directed    Discharge instructions   Complete by:  As directed    Remove dressing over right groin this evening and shower as needed.     Allergies as of 03/12/2017   No Known Allergies     Medication List    TAKE these medications   allopurinol 300 MG tablet  Commonly known as:  ZYLOPRIM Take 1 tablet (300 mg total) by mouth daily. Notes to patient:  Treat gout    amLODipine 10 MG tablet Commonly known as:  NORVASC Take 1 tablet (10 mg total) by mouth daily. Notes to patient:  Lowers blood pressure   aspirin EC 81 MG tablet Take 81 mg daily by mouth. Notes to patient:  Prevents clotting    atorvastatin 20 MG tablet Commonly known as:  LIPITOR Take 1 tablet (20 mg total) by mouth daily. Notes to patient:  Lowers cholesterol   cloNIDine 0.2 MG tablet Commonly known as:  CATAPRES Take 0.2 mg by mouth 2 (two) times daily. Notes to patient:  Lowers blood pressure    clopidogrel 75 MG  tablet Commonly known as:  PLAVIX Take 1 tablet (75 mg total) by mouth daily. Notes to patient:  Prevents clotting in the stent and heart attack    glimepiride 1 MG tablet Commonly known as:  AMARYL Take 1 tablet (1 mg total) by mouth daily with breakfast. Notes to patient:  Lowers blood sugar   labetalol 200 MG tablet Commonly known as:  NORMODYNE Take 200 mg by mouth 2 (two) times daily. Notes to patient:  Decreases blood pressure and heart rate    lisinopril 20 MG tablet Commonly known as:  PRINIVIL,ZESTRIL Take 1 tablet (20 mg total) by mouth daily. Notes to patient:  Decreases blood pressure   Magnesium 250 MG Tabs Take 1 tablet by mouth daily. Notes to patient:  Supplement    metoprolol succinate 100 MG 24 hr tablet Commonly known as:  TOPROL-XL Take 100 mg by mouth daily. Notes to patient:  Decreases work of the heart  Decreases blood pressure and heart rate  Decreases force of contraction of heart muscle    Omega-3 1000 MG Caps Take 1 capsule by mouth daily. Notes to patient:  Lowers cholesterol and triglycerides       Verbal and written Discharge instructions given to the patient. Wound care per Discharge AVS Follow-up Information    [name], [name], MD Follow up in 2 week(s).   Specialties:  Vascular Surgery, Cardiology Why:  office will call Contact information: [address] [place] [address] [phone]           Signed: [name] [name] 03/15/2017, 9:10 AM

## 2017-03-21 DIAGNOSIS — I160 Hypertensive urgency: Secondary | ICD-10-CM | POA: Diagnosis not present

## 2017-03-21 DIAGNOSIS — N189 Chronic kidney disease, unspecified: Secondary | ICD-10-CM | POA: Diagnosis not present

## 2017-03-21 DIAGNOSIS — Z6829 Body mass index (BMI) 29.0-29.9, adult: Secondary | ICD-10-CM | POA: Diagnosis not present

## 2017-03-21 DIAGNOSIS — E785 Hyperlipidemia, unspecified: Secondary | ICD-10-CM | POA: Diagnosis not present

## 2017-03-21 DIAGNOSIS — R7302 Impaired glucose tolerance (oral): Secondary | ICD-10-CM | POA: Diagnosis not present

## 2017-04-07 ENCOUNTER — Encounter (HOSPITAL_COMMUNITY): Payer: BLUE CROSS/BLUE SHIELD

## 2017-04-07 ENCOUNTER — Ambulatory Visit: Payer: BLUE CROSS/BLUE SHIELD | Admitting: Vascular Surgery

## 2017-04-07 DIAGNOSIS — N183 Chronic kidney disease, stage 3 (moderate): Secondary | ICD-10-CM | POA: Diagnosis not present

## 2017-04-07 DIAGNOSIS — Z8679 Personal history of other diseases of the circulatory system: Secondary | ICD-10-CM | POA: Diagnosis not present

## 2017-04-07 DIAGNOSIS — I129 Hypertensive chronic kidney disease with stage 1 through stage 4 chronic kidney disease, or unspecified chronic kidney disease: Secondary | ICD-10-CM | POA: Diagnosis not present

## 2017-05-23 DIAGNOSIS — K136 Irritative hyperplasia of oral mucosa: Secondary | ICD-10-CM | POA: Diagnosis not present

## 2017-06-03 ENCOUNTER — Other Ambulatory Visit: Payer: Self-pay

## 2017-06-03 DIAGNOSIS — I701 Atherosclerosis of renal artery: Secondary | ICD-10-CM

## 2017-06-16 ENCOUNTER — Ambulatory Visit: Payer: BLUE CROSS/BLUE SHIELD | Admitting: Family

## 2017-06-16 ENCOUNTER — Inpatient Hospital Stay (HOSPITAL_COMMUNITY): Admission: RE | Admit: 2017-06-16 | Payer: BLUE CROSS/BLUE SHIELD | Source: Ambulatory Visit

## 2017-06-29 ENCOUNTER — Other Ambulatory Visit: Payer: Self-pay

## 2017-06-29 MED ORDER — ATORVASTATIN CALCIUM 20 MG PO TABS: 20.0000 mg | ORAL_TABLET | Freq: Every day | ORAL | 0 refills | Status: DC

## 2017-11-04 DIAGNOSIS — R5383 Other fatigue: Secondary | ICD-10-CM | POA: Diagnosis not present

## 2017-11-04 DIAGNOSIS — R7302 Impaired glucose tolerance (oral): Secondary | ICD-10-CM | POA: Diagnosis not present

## 2017-11-04 DIAGNOSIS — Z1339 Encounter for screening examination for other mental health and behavioral disorders: Secondary | ICD-10-CM | POA: Diagnosis not present

## 2017-11-04 DIAGNOSIS — Z23 Encounter for immunization: Secondary | ICD-10-CM | POA: Diagnosis not present

## 2017-11-04 DIAGNOSIS — Z79899 Other long term (current) drug therapy: Secondary | ICD-10-CM | POA: Diagnosis not present

## 2017-11-04 DIAGNOSIS — Z6832 Body mass index (BMI) 32.0-32.9, adult: Secondary | ICD-10-CM | POA: Diagnosis not present

## 2017-11-04 DIAGNOSIS — I160 Hypertensive urgency: Secondary | ICD-10-CM | POA: Diagnosis not present

## 2017-11-04 DIAGNOSIS — E785 Hyperlipidemia, unspecified: Secondary | ICD-10-CM | POA: Diagnosis not present

## 2017-11-14 DIAGNOSIS — Z9119 Patient's noncompliance with other medical treatment and regimen: Secondary | ICD-10-CM | POA: Diagnosis not present

## 2017-11-14 DIAGNOSIS — Z8673 Personal history of transient ischemic attack (TIA), and cerebral infarction without residual deficits: Secondary | ICD-10-CM | POA: Diagnosis not present

## 2017-11-14 DIAGNOSIS — Z6832 Body mass index (BMI) 32.0-32.9, adult: Secondary | ICD-10-CM | POA: Diagnosis not present

## 2017-11-14 DIAGNOSIS — I10 Essential (primary) hypertension: Secondary | ICD-10-CM | POA: Diagnosis not present

## 2018-01-04 IMAGING — DX DG CHEST 1V PORT
1 series · 1 of 1 positions shown · non-contrast
Comparison: CT and x-ray 11/11/2016

CLINICAL DATA: Short of breath

EXAM:
PORTABLE CHEST 1 VIEW

[chest]
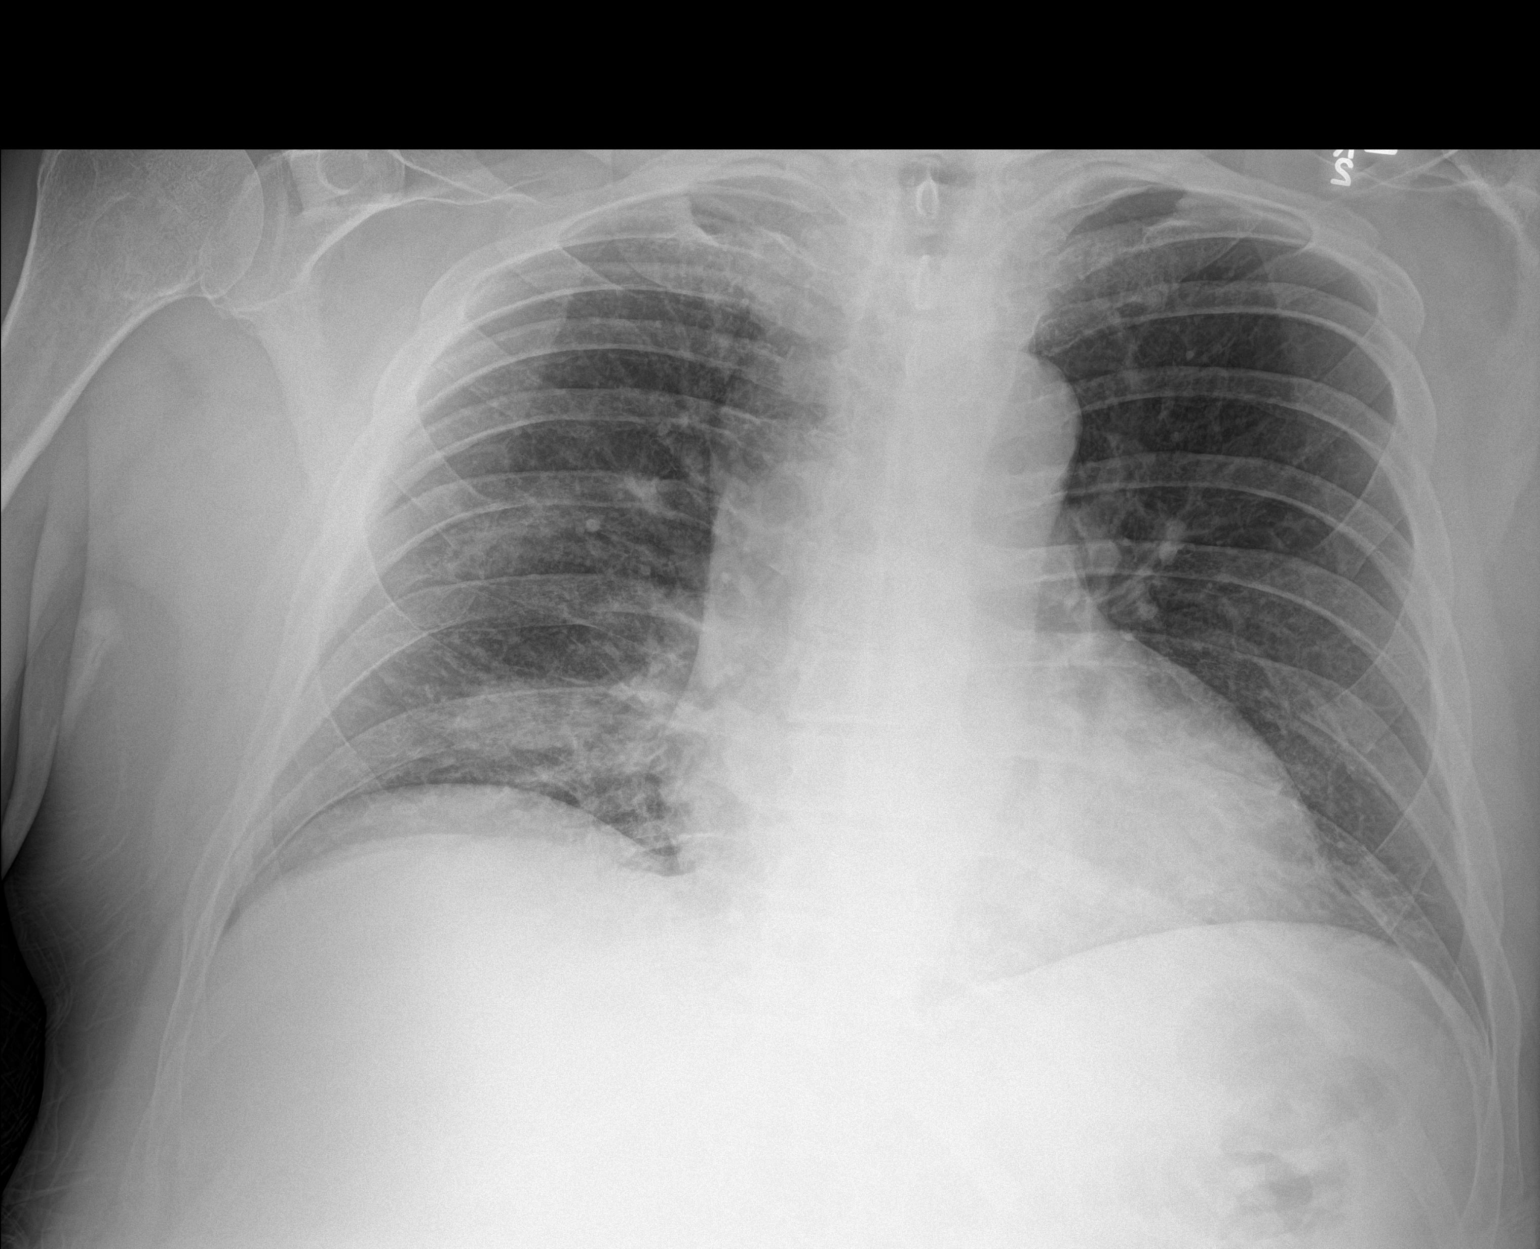

[1 of 1 positions shown; findings below may reference images not displayed]

FINDINGS: Improvement in bilateral airspace disease. Bibasilar airspace
disease remains. Probable clearing heart failure. No effusion. Heart
size upper normal.
IMPRESSION: Improving bilateral airspace disease compatible with improving heart
failure.

## 2018-03-07 DIAGNOSIS — Z719 Counseling, unspecified: Secondary | ICD-10-CM | POA: Diagnosis not present

## 2018-03-21 DIAGNOSIS — I10 Essential (primary) hypertension: Secondary | ICD-10-CM | POA: Diagnosis not present

## 2018-03-21 DIAGNOSIS — Z6831 Body mass index (BMI) 31.0-31.9, adult: Secondary | ICD-10-CM | POA: Diagnosis not present

## 2018-03-21 DIAGNOSIS — R7302 Impaired glucose tolerance (oral): Secondary | ICD-10-CM | POA: Diagnosis not present

## 2018-03-21 DIAGNOSIS — E785 Hyperlipidemia, unspecified: Secondary | ICD-10-CM | POA: Diagnosis not present

## 2018-03-28 DIAGNOSIS — M79671 Pain in right foot: Secondary | ICD-10-CM | POA: Diagnosis not present

## 2018-03-28 DIAGNOSIS — Z6831 Body mass index (BMI) 31.0-31.9, adult: Secondary | ICD-10-CM | POA: Diagnosis not present

## 2018-03-29 ENCOUNTER — Ambulatory Visit: Payer: 59 | Admitting: Sports Medicine

## 2018-04-04 DIAGNOSIS — M216X1 Other acquired deformities of right foot: Secondary | ICD-10-CM | POA: Diagnosis not present

## 2018-04-04 DIAGNOSIS — M7751 Other enthesopathy of right foot: Secondary | ICD-10-CM | POA: Diagnosis not present

## 2018-05-16 DIAGNOSIS — M7751 Other enthesopathy of right foot: Secondary | ICD-10-CM | POA: Diagnosis not present

## 2018-05-16 DIAGNOSIS — M216X1 Other acquired deformities of right foot: Secondary | ICD-10-CM | POA: Insufficient documentation

## 2018-09-27 DIAGNOSIS — Z8673 Personal history of transient ischemic attack (TIA), and cerebral infarction without residual deficits: Secondary | ICD-10-CM | POA: Insufficient documentation

## 2018-09-27 DIAGNOSIS — W01119A Fall on same level from slipping, tripping and stumbling with subsequent striking against unspecified sharp object, initial encounter: Secondary | ICD-10-CM | POA: Insufficient documentation

## 2018-09-27 DIAGNOSIS — E785 Hyperlipidemia, unspecified: Secondary | ICD-10-CM | POA: Insufficient documentation

## 2018-09-27 DIAGNOSIS — S59912A Unspecified injury of left forearm, initial encounter: Secondary | ICD-10-CM | POA: Insufficient documentation

## 2018-09-27 DIAGNOSIS — L03114 Cellulitis of left upper limb: Secondary | ICD-10-CM | POA: Insufficient documentation

## 2018-09-27 DIAGNOSIS — W450XXA Nail entering through skin, initial encounter: Secondary | ICD-10-CM | POA: Insufficient documentation

## 2018-09-27 DIAGNOSIS — S6992XA Unspecified injury of left wrist, hand and finger(s), initial encounter: Secondary | ICD-10-CM | POA: Insufficient documentation

## 2019-04-27 DIAGNOSIS — I214 Non-ST elevation (NSTEMI) myocardial infarction: Secondary | ICD-10-CM | POA: Insufficient documentation

## 2019-04-30 DIAGNOSIS — Z951 Presence of aortocoronary bypass graft: Secondary | ICD-10-CM | POA: Insufficient documentation

## 2019-05-18 DIAGNOSIS — I2510 Atherosclerotic heart disease of native coronary artery without angina pectoris: Secondary | ICD-10-CM | POA: Insufficient documentation

## 2019-07-04 DIAGNOSIS — J90 Pleural effusion, not elsewhere classified: Secondary | ICD-10-CM | POA: Insufficient documentation

## 2019-09-12 DIAGNOSIS — L03116 Cellulitis of left lower limb: Secondary | ICD-10-CM | POA: Insufficient documentation

## 2019-10-05 ENCOUNTER — Other Ambulatory Visit: Payer: Self-pay

## 2019-10-05 NOTE — Patient Outreach (Signed)
[facility]) Care Management  10/05/2019  [name] [name] July 17, 1955 [id number]   New referral for [facility] patient. Dx: sepsis       Cellulitis       DM    Placed call to patient with no answer. Immediately call back from patient who reports he is going home from rehab today.  Reviewed with patient that he is able to walk with walker. Reports he has follow up planned with primary MD on Monday 10/08/2019. Reports he will be able to get in car and wife will drive to appointment.   Reviewed with patient the importance of taking medications as prescribed. Following up with MD. Participating in PT and OT.   PLAN: reviewed with patient I would call him on 10/09/2019 and review needs . Patient agreed to program. Provided my phone number and contact information if needed sooner.   Rowe Pavy, RN, BSN, CEN [facility] [employer] [phone]

## 2019-10-09 ENCOUNTER — Other Ambulatory Visit: Payer: Self-pay

## 2019-10-16 ENCOUNTER — Other Ambulatory Visit: Payer: Self-pay

## 2019-10-16 NOTE — Patient Outreach (Signed)
[facility]) Care Management  [facility] Care Manager  10/16/2019   TOU HAYNER 1955-04-16 [id number]  Subjective:Pateint reports that he is about the same. Reports seeing new PCP today. Dr. Keturah Barre. Reports new RX for doxycycline for cellulitis of the left leg. Reports PT and OT to start this week. Evalulations done last week. Reports his is taking all medications as prescribed. Reviewed medication list as below.  Reports prednisone Dr. Jeanie Sewer put him on last week did not help. Reports he is no longer taking Lasix.  Reports not wearing compression hose at this time.  Denies fever.   Report he was told to increase his fluid intake, avoid salt and control his diet better.  Objective:  Awake and alert and talking in complete sentences.      Functional Status:  No flowsheet data found.  Fall/Depression Screening: Fall Risk  12/10/2016  Falls in the past year? No   PHQ 2/9 Scores 12/10/2016  PHQ - 2 Score 0  PHQ- 9 Score 0    Assessment:  New primary MD. New medication for infection. Continues to have problems with walking. --PT and OT scheduled for this week.    Plan:  Will follow up with patient in 1 week. Encouraged patient to call sooner if needed.   Rowe Pavy, RN, BSN, CEN [facility] [employer] [phone]

## 2019-10-16 NOTE — Patient Outreach (Signed)
[facility]) Care Management  10/16/2019  CARLITO BOGERT March 18, 1955 [id number]   LATE ENTRY NOTE from 10/09/2019   Placed call to patient and reviewed [facility] services again. Patient report to me that he saw primary MD Dr. Jeanie Sewer on 10/08/2019 and was prescribed prednisone.  Reports that he has not yet heard from home health.  According to discharge papers to be [facility].  Reports using walker. Denies needs for wheelchair.  Reports he is taking all his medications as prescribed.  Report pain 8/10 today on Lyrica.   Placed call to [facility] and confirmed orders and correct contact number. Placed call back to patient to reassure he would get a visit the next day.   PLAN: will follow up in 1 week.,   Rowe Pavy, RN, BSN, CEN [facility] Coordinator [phone]

## 2019-10-23 ENCOUNTER — Ambulatory Visit: Payer: Self-pay

## 2019-10-24 DIAGNOSIS — M1A0790 Idiopathic chronic gout, unspecified ankle and foot, without tophus (tophi): Secondary | ICD-10-CM | POA: Insufficient documentation

## 2019-10-31 ENCOUNTER — Other Ambulatory Visit: Payer: Self-pay

## 2019-10-31 NOTE — Patient Outreach (Signed)
[facility]) Care Management  10/31/2019  [name] [name] 1955-07-27 [id number]  Telephone assessment:  10/30/2019  Placed call to patient who answered and identified himself. Reports he is frustrated with difficulty walking, Reports saw podiatrist recently with no helpful assistance. Reports he continues to work with PT and exercises and mobility.reports he has to use his walker when out of the home. But while at home he is able to walk without walker. Reports saw primary MD again and was placed on Augmentin for recurrent cellulitis.  Reports RX called in for cholechine but denies by insurance. Patient reports he will followup with MD about this himself.  Reports he is eating and drinking well. Self monitoring CBG daily. Reports readings high 100 non fasting.  Reports he is able to manage his personal hygiene and his wife is available to assist if needed.  Reports overall, he thinks thinks things are improving. Denies any new concerns today.   Reviewed with patient his goals of improving ambulation. We discussed keeping legs clean and dry and observing for new infection or changes in skin. Reviewed with patient the importance of tight DM control to assist with healing process.  Reviewed with patient the importance of continued home health PT and self motivated therapy.   PLAN: patient request call back in 2-3 weeks for follow up. I reminded patient to call me sooner if needed.   Rowe Pavy, RN, BSN, CEN [facility] [employer] [phone]

## 2019-11-05 ENCOUNTER — Other Ambulatory Visit: Payer: Self-pay

## 2019-11-05 DIAGNOSIS — I701 Atherosclerosis of renal artery: Secondary | ICD-10-CM

## 2019-11-13 ENCOUNTER — Other Ambulatory Visit: Payer: Self-pay

## 2019-11-13 DIAGNOSIS — I739 Peripheral vascular disease, unspecified: Secondary | ICD-10-CM

## 2019-11-13 NOTE — Patient Outreach (Signed)
[facility]) Care Management  11/13/2019  MALEAK BRAZZEL 09/04/1955 [id number]   Telephone assessment:  Placed call to patient for update on condition and ability to walk, no answer. Left a message requesting a call back.   PLAN: will call back in 3 business days.  Rowe Pavy, RN, BSN, CEN [facility] [employer] [phone]

## 2019-11-15 ENCOUNTER — Ambulatory Visit: Payer: 59 | Admitting: Vascular Surgery

## 2019-11-15 ENCOUNTER — Encounter (HOSPITAL_COMMUNITY): Payer: 59

## 2019-11-16 ENCOUNTER — Other Ambulatory Visit: Payer: Self-pay

## 2019-11-16 NOTE — Patient Outreach (Signed)
[name]) Care Management  11/16/2019  [name] [name] November 02, 1955 [id number]   Telephone assessment:  Placed call to patient for telephone assessment. No answer.    PLAN: will attempt again in 3 business days.  Rowe Pavy, RN, BSN, CEN [facility] [employer] [phone]

## 2019-11-21 ENCOUNTER — Other Ambulatory Visit: Payer: Self-pay

## 2019-11-21 NOTE — Patient Outreach (Signed)
[facility]) Care Management  11/21/2019  [name] [name] 1955/01/08 [id number]   Telephone assessment:  Attempted to reach patient without success. Left a message requesting a call back.  PLAN: will attempt 4th outreach in 4 weeks if no response.  Rowe Pavy, RN, BSN, CEN [facility] [employer] [phone]

## 2019-11-22 ENCOUNTER — Other Ambulatory Visit: Payer: Self-pay

## 2019-11-22 ENCOUNTER — Ambulatory Visit (HOSPITAL_COMMUNITY)
Admission: RE | Admit: 2019-11-22 | Discharge: 2019-11-22 | Disposition: A | Discharge status: Home / Self Care | Payer: 59 | Source: Ambulatory Visit | Attending: Vascular Surgery | Admitting: Vascular Surgery

## 2019-11-22 ENCOUNTER — Ambulatory Visit (INDEPENDENT_AMBULATORY_CARE_PROVIDER_SITE_OTHER): Payer: 59 | Admitting: Vascular Surgery

## 2019-11-22 ENCOUNTER — Encounter: Payer: Self-pay | Admitting: Vascular Surgery

## 2019-11-22 ENCOUNTER — Ambulatory Visit (INDEPENDENT_AMBULATORY_CARE_PROVIDER_SITE_OTHER)
Admission: RE | Admit: 2019-11-22 | Discharge: 2019-11-22 | Disposition: A | Discharge status: Home / Self Care | Payer: 59 | Source: Ambulatory Visit | Attending: Vascular Surgery | Admitting: Vascular Surgery

## 2019-11-22 VITALS — BP 160/114 | HR 74 | Temp 98.1°F | Resp 20 | Ht 70.0 in | Wt 224.0 lb

## 2019-11-22 DIAGNOSIS — I739 Peripheral vascular disease, unspecified: Secondary | ICD-10-CM

## 2019-11-22 DIAGNOSIS — I701 Atherosclerosis of renal artery: Secondary | ICD-10-CM | POA: Insufficient documentation

## 2019-11-22 NOTE — Progress Notes (Signed)
Patient is a 64 year old male referred for evaluation after recent episode of cellulitis.  He apparently had a cellulitis in both legs that was fairly extensive requiring several days of hospitalization.  He actually ended up in rehab for.  After this and still has significant deconditioning of both lower extremities.  He states he is getting stronger.  But he still cannot walk very far.  He states that the left leg has always had some difficulty ever since he had a vein harvested from this for coronary artery bypass grafting.  He does not recall having any vein harvested from the right side.  He is currently on Plavix and aspirin.  I was unable to find out that he was on a cholesterol medication.  Of note he did have a right renal artery stent placed by me in 2019.  He has not had any difficulty with blood pressure or kidney dysfunction.  He does not really describe claudication.  He currently does not have any nonhealing wounds.  He has no prior history of DVT.  He does have a history of diabetes and some peripheral neuropathy.  Past Medical History:  Diagnosis Date  . Cellulitis   . Chronic kidney disease   . CVA (cerebral vascular accident) (HCC) 2014  . Diabetes mellitus without complication (HCC)   . Gout   . Hypertension   . Obesity (BMI 30.0-34.9)   . Sleep disorder    Past Surgical History:  Procedure Laterality Date  . Arthroscopic knee surgery    . PERIPHERAL VASCULAR INTERVENTION Right 03/11/2017   Procedure: PERIPHERAL VASCULAR INTERVENTION;  Surgeon: Sherren Kerns, MD;  Location: [facility] INVASIVE CV LAB;  Service: Cardiovascular;  Laterality: Right;  right renal artery stent  . RENAL ANGIOGRAPHY N/A 03/11/2017   Procedure: RENAL ANGIOGRAPHY;  Surgeon: Sherren Kerns, MD;  Location: [facility] INVASIVE CV LAB;  Service: Cardiovascular;  Laterality: N/A;  . TONSILLECTOMY      Current Outpatient Medications on File Prior to Visit  Medication Sig Dispense Refill  . allopurinol (ZYLOPRIM) 300  MG tablet Take 1 tablet (300 mg total) by mouth daily. 30 tablet 0  . amLODipine (NORVASC) 10 MG tablet Take 1 tablet (10 mg total) by mouth daily. 30 tablet 0  . aspirin EC 81 MG tablet Take 81 mg daily by mouth.    . cholecalciferol (VITAMIN D3) 10 MCG (400 UNIT) TABS tablet Take 800 Units by mouth.    . cloNIDine (CATAPRES) 0.1 MG tablet Take 0.1 mg by mouth 2 (two) times daily.    . clopidogrel (PLAVIX) 75 MG tablet Take 1 tablet (75 mg total) by mouth daily. 30 tablet 11  . lisinopril (ZESTRIL) 10 MG tablet Take 10 mg by mouth daily.    . metoprolol succinate (TOPROL-XL) 100 MG 24 hr tablet Take 100 mg by mouth daily. Take 4 tablets once a day    . nitroGLYCERIN (NITROSTAT) 0.4 MG SL tablet Place 0.4 mg under the tongue every 5 (five) minutes as needed for chest pain.     . pregabalin (LYRICA) 75 MG capsule Take 75 mg by mouth 2 (two) times daily.     . Semaglutide, 1 MG/DOSE, (OZEMPIC, 1 MG/DOSE,) 2 MG/1.5ML SOPN Inject 1 mg into the skin once a week. Takes on Friday     Current Facility-Administered Medications on File Prior to Visit  Medication Dose Route Frequency Provider Last Rate Last Admin  . labetalol (NORMODYNE) tablet 200 mg  200 mg Oral Once Sherren Kerns, MD  Allergies  Allergen Reactions  . Ibuprofen     Other reaction(s): Other (See Comments) Contraindicated by his CKD    Review of systems: He has no shortness of breath.  He has no chest pain.  Physical exam:  Vitals:   11/22/19 0930  BP: (!) 160/114  Pulse: 74  Resp: 20  Temp: 98.1 F (36.7 C)  SpO2: 96%  Weight: 224 lb (101.6 kg)  Height: 5\' 10"  (1.778 m)    HEENT: Normal  Neck: No jugular venous distention  Cardiac: Regular rate and rhythm  Abdomen: Soft nontender slightly obese  Extremities: 2+ femoral pulses 2+ left popliteal pulse absent right popliteal and pedal pulses absent pedal pulses left foot  Skin: No open ulcer or wound  Neuro: Slightly decreased sensation both feet.   Symmetric upper extremity lower extremity motor strength which is 5/5  Data: Patient had bilateral ABIs performed today which were triphasic bilaterally but vessels were calcified and noncompressible.  On the left side.  Right toe pressure was 94 left toe pressure was 153 both of these are essentially normal.  Patient also had a renal duplex exam today which showed no significant renarrowing of his right renal artery stent and patent left renal artery  Assessment: Bilateral lower extremity cellulitis now recovering.  He probably has at least an element of mild peripheral arterial disease but has normal toe pressures bilaterally.  He currently is having difficulty walking but this seems to be more deconditioning rather than claudication.  His left leg seems to be the worst potentially some of the cellulitis may be related to venous disease but he has had the left greater saphenous vein removed.  Plan: Patient will follow up with me in 6 months time with lower extremity arterial duplex exam.  He will follow-up sooner if he has worsening symptoms.  When he returns in 6 months his deconditioning should have resolved and if he still has persistent symptoms we may need to consider arteriogram.  , MD [facility] Office: [phone]

## 2019-12-14 ENCOUNTER — Ambulatory Visit (INDEPENDENT_AMBULATORY_CARE_PROVIDER_SITE_OTHER): Payer: 59

## 2019-12-14 ENCOUNTER — Other Ambulatory Visit: Payer: Self-pay

## 2019-12-14 ENCOUNTER — Encounter: Payer: Self-pay | Admitting: Sports Medicine

## 2019-12-14 ENCOUNTER — Ambulatory Visit (INDEPENDENT_AMBULATORY_CARE_PROVIDER_SITE_OTHER): Payer: 59 | Admitting: Sports Medicine

## 2019-12-14 DIAGNOSIS — M779 Enthesopathy, unspecified: Secondary | ICD-10-CM

## 2019-12-14 DIAGNOSIS — M19079 Primary osteoarthritis, unspecified ankle and foot: Secondary | ICD-10-CM | POA: Diagnosis not present

## 2019-12-14 DIAGNOSIS — M25571 Pain in right ankle and joints of right foot: Secondary | ICD-10-CM

## 2019-12-14 DIAGNOSIS — M7750 Other enthesopathy of unspecified foot: Secondary | ICD-10-CM | POA: Diagnosis not present

## 2019-12-14 DIAGNOSIS — M25572 Pain in left ankle and joints of left foot: Secondary | ICD-10-CM

## 2019-12-14 DIAGNOSIS — Z7409 Other reduced mobility: Secondary | ICD-10-CM

## 2019-12-14 DIAGNOSIS — E1142 Type 2 diabetes mellitus with diabetic polyneuropathy: Secondary | ICD-10-CM | POA: Diagnosis not present

## 2019-12-14 DIAGNOSIS — M25373 Other instability, unspecified ankle: Secondary | ICD-10-CM

## 2019-12-14 NOTE — Progress Notes (Signed)
Subjective:  [name] [name] is a 64 y.o. male patient who presents to office for evaluation of right worse than left ankle pain. Patient complains of continued pain in the ankle that is off and on with sharp shooting pains and he has to sit down reports that he also feels very unsteady on his feet and feels like his ankle is going to give out from underneath him reports that it does hurt to walk from time to time sometimes it hurts for the first few steps sometimes it hurts after being on his feet for a while patient admits to a past history of right leg cellulitis and being admitted to rehab and reports that he never regained strength even after being discharged from rehab. Patient has tried over-the-counter medication with no relief in symptoms. Patient denies any other pedal complaints. Denies recent injury/trip/fall/sprain/any causative factors.   Review of system noncontributory  Patient Active Problem List   Diagnosis Date Noted  . 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 Cellulitis of left anterior lower leg 09/12/2019  . Pleural effusion 07/04/2019  . CAD in native artery 05/18/2019  . S/P CABG (coronary artery bypass graft) 04/30/2019  . NSTEMI (non-ST elevated myocardial infarction) (HCC) 04/27/2019  . Cellulitis of left forearm 09/27/2018  . Dyslipidemia 09/27/2018  . Fall against sharp object 09/27/2018  . History of CVA (cerebrovascular accident) 09/27/2018  . Injury by nail 09/27/2018  . Injury, forearm and wrist, left, initial encounter 09/27/2018  . Prominent metatarsal head of right foot 05/16/2018  . Capsulitis of metatarsophalangeal (MTP) joint of right foot 04/04/2018  . Plantar fat pad atrophy of right foot 04/04/2018  . PAD (peripheral artery disease) (HCC) 03/11/2017  . Gait disturbance, post-stroke 12/10/2016  . Anomic dysphasia 12/10/2016  . Anxiety state   . Hypertensive crisis   . Acute idiopathic gout of right knee   . Stroke due to  embolism of left middle cerebral artery (HCC) 11/12/2016  . Shortness of breath   . SOB (shortness of breath)   . Left hemiparesis (HCC)   . Acute pain of right knee   . Benign essential HTN   . Stage 3 chronic kidney disease (HCC)   . Newly diagnosed diabetes (HCC)   . Chest pain   . Atypical chest pain 10/14/2016  . Abnormal EKG 09/10/2016  . Sleep disorder 09/10/2016  . Benign essential hypertension 09/10/2016  . Elevated cholesterol 09/10/2016  . Intracranial atherosclerosis 04/29/2016  . OSA (obstructive sleep apnea) 04/29/2016  . Alcohol use 04/28/2016  . HTN (hypertension), malignant 04/28/2016  . Obesity (BMI 30.0-34.9) 04/28/2016  . Stress reaction 04/28/2016  . New onset type 2 diabetes mellitus (HCC) 04/27/2016  . AKI (acute kidney injury) (HCC) 04/26/2016  . Ataxia 04/26/2016  . Confusion 04/26/2016  . Gout 04/26/2016    Current Outpatient Medications on File Prior to Visit  Medication Sig Dispense Refill  . ALPRAZolam (XANAX) 0.25 MG tablet Take by mouth.    Marland Kitchen atorvastatin (LIPITOR) 80 MG tablet Take by mouth.    . colchicine 0.6 MG tablet 1 tablet twice daily as needed for gout    . doxycycline (VIBRA-TABS) 100 MG tablet Take by mouth.    . furosemide (LASIX) 40 MG tablet TAKE 1 TABLET BY MOUTH TWICE DAILY FOR 3 DAYS, THEN  REDUCE  TO  1  TABLET  DAILY    . gabapentin (NEURONTIN) 100 MG capsule Take by mouth.    . potassium chloride (KLOR-CON) 10 MEQ  tablet Take by mouth.    Marland Kitchen allopurinol (ZYLOPRIM) 100 MG tablet Take 100 mg by mouth daily.    Marland Kitchen allopurinol (ZYLOPRIM) 300 MG tablet Take 1 tablet (300 mg total) by mouth daily. 30 tablet 0  . amLODipine (NORVASC) 10 MG tablet Take 1 tablet (10 mg total) by mouth daily. 30 tablet 0  . amLODipine (NORVASC) 5 MG tablet Take 5 mg by mouth daily.    Marland Kitchen aspirin EC 81 MG tablet Take 81 mg daily by mouth.    . betamethasone dipropionate 0.05 % cream Apply topically 2 (two) times daily as needed.    . chlorproMAZINE  (THORAZINE) 25 MG tablet Take 25 mg by mouth 3 (three) times daily as needed.    . cholecalciferol (VITAMIN D3) 10 MCG (400 UNIT) TABS tablet Take 800 Units by mouth.    . clindamycin (CLEOCIN) 150 MG capsule Take 150 mg by mouth 3 (three) times daily.    . cloNIDine (CATAPRES) 0.1 MG tablet Take 0.1 mg by mouth 2 (two) times daily.    . cloNIDine (CATAPRES) 0.2 MG tablet Take 0.2 mg by mouth 2 (two) times daily.    . clopidogrel (PLAVIX) 75 MG tablet Take 1 tablet (75 mg total) by mouth daily. 30 tablet 11  . indomethacin (INDOCIN) 50 MG capsule Take 50 mg by mouth 3 (three) times daily.    Marland Kitchen lisinopril (ZESTRIL) 10 MG tablet Take 10 mg by mouth daily.    . metFORMIN (GLUCOPHAGE-XR) 500 MG 24 hr tablet Take 500 mg by mouth every morning.    . methylPREDNISolone (MEDROL DOSEPAK) 4 MG TBPK tablet Take by mouth as directed.    . metoprolol succinate (TOPROL-XL) 100 MG 24 hr tablet Take 100 mg by mouth daily. Take 4 tablets once a day    . nitroGLYCERIN (NITROSTAT) 0.4 MG SL tablet Place 0.4 mg under the tongue every 5 (five) minutes as needed for chest pain.     . predniSONE (DELTASONE) 20 MG tablet Take 40 mg by mouth daily.    . pregabalin (LYRICA) 75 MG capsule Take 75 mg by mouth 2 (two) times daily.     . Semaglutide, 1 MG/DOSE, (OZEMPIC, 1 MG/DOSE,) 2 MG/1.5ML SOPN Inject 1 mg into the skin once a week. Takes on Friday     Current Facility-Administered Medications on File Prior to Visit  Medication Dose Route Frequency Provider Last Rate Last Admin  . labetalol (NORMODYNE) tablet 200 mg  200 mg Oral Once Sherren Kerns, MD        Allergies  Allergen Reactions  . Ibuprofen     Other reaction(s): Other (See Comments) Contraindicated by his CKD    Objective:  General: Alert and oriented x3 in no acute distress  Dermatology: No open lesions bilateral lower extremities, no webspace macerations, no ecchymosis bilateral, all nails x 10 are well manicured.  Vascular: Dorsalis Pedis  and Posterior Tibial pedal pulses faintly palpable, Capillary Fill Time 3 seconds,(-) pedal hair growth bilateral, trace edema bilateral lower extremities, Temperature gradient within normal limits.  Neurology: Michaell Cowing sensation intact via light touch bilateral.  Musculoskeletal: No reproducible tenderness to ankles bilateral.  Patient does have limited range of motion bilateral ankles right worse than left with no frank instability however there is weakness with manual muscle testing.  Gait: Antalgic gait  Xrays  Right and left ankle   Impression: Diffuse arthritis and significant joint space narrowing  Assessment and Plan: Problem List Items Addressed This Visit   None  Visit Diagnoses    Tendonitis    -  Primary   Relevant Orders   DG Ankle Complete Right   DG Ankle Complete Left   Unstable ankle, unspecified laterality       Arthritis of ankle       Bilateral ankle joint pain       Diabetic polyneuropathy associated with type 2 diabetes mellitus (HCC)       Relevant Medications   ALPRAZolam (XANAX) 0.25 MG tablet   atorvastatin (LIPITOR) 80 MG tablet   chlorproMAZINE (THORAZINE) 25 MG tablet   gabapentin (NEURONTIN) 100 MG capsule   metFORMIN (GLUCOPHAGE-XR) 500 MG 24 hr tablet   Mobility impaired           -Complete examination performed -Xrays reviewed -Discussed treatment options for ankle pain with subjective instability -Dispensed ankle gauntlet for patient to use as instructed -Advised patient rest elevation and use of topical pain cream that he has at home for arthritis -Educated patient on proper stretching and range of motion and seated exercises -Continue with good supportive shoes daily for foot type -Patient to return to office as scheduled or sooner if condition worsens.  Advised patient if the ankle support helps may benefit from custom braces for long-term benefit.  Asencion Islam, DPM

## 2019-12-17 ENCOUNTER — Other Ambulatory Visit: Payer: Self-pay | Admitting: Sports Medicine

## 2019-12-17 DIAGNOSIS — M25571 Pain in right ankle and joints of right foot: Secondary | ICD-10-CM

## 2019-12-17 DIAGNOSIS — M7750 Other enthesopathy of unspecified foot: Secondary | ICD-10-CM

## 2019-12-18 ENCOUNTER — Ambulatory Visit: Payer: 59 | Admitting: Sports Medicine

## 2019-12-19 ENCOUNTER — Other Ambulatory Visit: Payer: Self-pay

## 2019-12-19 NOTE — Patient Outreach (Signed)
[facility]) Care Management  12/19/2019  [name] [name] 05-29-1955 [id number]   Case closure: Placed 4th call to patient without an answer.    PLAN: close case as unable to maintain contact with patient.  Rowe Pavy, RN, BSN, CEN [facility] [employer] [phone]

## 2020-01-16 ENCOUNTER — Ambulatory Visit: Payer: Managed Care, Other (non HMO) | Admitting: Sports Medicine

## 2020-01-16 ENCOUNTER — Encounter: Payer: Self-pay | Admitting: Sports Medicine

## 2020-01-16 ENCOUNTER — Other Ambulatory Visit: Payer: Self-pay

## 2020-01-16 DIAGNOSIS — M25373 Other instability, unspecified ankle: Secondary | ICD-10-CM

## 2020-01-16 DIAGNOSIS — M779 Enthesopathy, unspecified: Secondary | ICD-10-CM

## 2020-01-16 DIAGNOSIS — M7751 Other enthesopathy of right foot: Secondary | ICD-10-CM

## 2020-01-16 DIAGNOSIS — M25571 Pain in right ankle and joints of right foot: Secondary | ICD-10-CM

## 2020-01-16 DIAGNOSIS — M19079 Primary osteoarthritis, unspecified ankle and foot: Secondary | ICD-10-CM

## 2020-01-16 DIAGNOSIS — Z7409 Other reduced mobility: Secondary | ICD-10-CM

## 2020-01-16 DIAGNOSIS — M25572 Pain in left ankle and joints of left foot: Secondary | ICD-10-CM

## 2020-01-16 DIAGNOSIS — E1142 Type 2 diabetes mellitus with diabetic polyneuropathy: Secondary | ICD-10-CM

## 2020-01-16 MED ORDER — TRIAMCINOLONE ACETONIDE 10 MG/ML IJ SUSP
10.0000 mg | Freq: Once | INTRAMUSCULAR | Status: AC
  Administered 2020-01-16: 10 mg

## 2020-01-16 NOTE — Progress Notes (Signed)
Subjective:  [name] [name] is a 65 y.o. male patient who presents to office for evaluation of right ankle pain. Patient complains of continued pain in the ankle. Patient has tried braces with no relief in symptoms. Patient denies any other pedal complaints.   Patient Active Problem List   Diagnosis Date Noted  . 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 Cellulitis of left anterior lower leg 09/12/2019  . Pleural effusion 07/04/2019  . CAD in native artery 05/18/2019  . S/P CABG (coronary artery bypass graft) 04/30/2019  . NSTEMI (non-ST elevated myocardial infarction) (HCC) 04/27/2019  . Cellulitis of left forearm 09/27/2018  . Dyslipidemia 09/27/2018  . Fall against sharp object 09/27/2018  . History of CVA (cerebrovascular accident) 09/27/2018  . Injury by nail 09/27/2018  . Injury, forearm and wrist, left, initial encounter 09/27/2018  . Prominent metatarsal head of right foot 05/16/2018  . Capsulitis of metatarsophalangeal (MTP) joint of right foot 04/04/2018  . Plantar fat pad atrophy of right foot 04/04/2018  . PAD (peripheral artery disease) (HCC) 03/11/2017  . Gait disturbance, post-stroke 12/10/2016  . Anomic dysphasia 12/10/2016  . Anxiety state   . Hypertensive crisis   . Acute idiopathic gout of right knee   . Stroke due to embolism of left middle cerebral artery (HCC) 11/12/2016  . Shortness of breath   . SOB (shortness of breath)   . Left hemiparesis (HCC)   . Acute pain of right knee   . Benign essential HTN   . Stage 3 chronic kidney disease (HCC)   . Newly diagnosed diabetes (HCC)   . Chest pain   . Atypical chest pain 10/14/2016  . Abnormal EKG 09/10/2016  . Sleep disorder 09/10/2016  . Benign essential hypertension 09/10/2016  . Elevated cholesterol 09/10/2016  . Intracranial atherosclerosis 04/29/2016  . OSA (obstructive sleep apnea) 04/29/2016  . Alcohol use 04/28/2016  . HTN (hypertension), malignant 04/28/2016  . Obesity (BMI  30.0-34.9) 04/28/2016  . Stress reaction 04/28/2016  . New onset type 2 diabetes mellitus (HCC) 04/27/2016  . AKI (acute kidney injury) (HCC) 04/26/2016  . Ataxia 04/26/2016  . Confusion 04/26/2016  . Gout 04/26/2016    Current Outpatient Medications on File Prior to Visit  Medication Sig Dispense Refill  . allopurinol (ZYLOPRIM) 100 MG tablet Take 100 mg by mouth daily.    Marland Kitchen allopurinol (ZYLOPRIM) 300 MG tablet Take 1 tablet (300 mg total) by mouth daily. 30 tablet 0  . ALPRAZolam (XANAX) 0.25 MG tablet Take by mouth.    Marland Kitchen amLODipine (NORVASC) 10 MG tablet Take 1 tablet (10 mg total) by mouth daily. 30 tablet 0  . amLODipine (NORVASC) 5 MG tablet Take 5 mg by mouth daily.    Marland Kitchen aspirin EC 81 MG tablet Take 81 mg daily by mouth.    Marland Kitchen atorvastatin (LIPITOR) 80 MG tablet Take by mouth.    . betamethasone dipropionate 0.05 % cream Apply topically 2 (two) times daily as needed.    . chlorproMAZINE (THORAZINE) 25 MG tablet Take 25 mg by mouth 3 (three) times daily as needed.    . cholecalciferol (VITAMIN D3) 10 MCG (400 UNIT) TABS tablet Take 800 Units by mouth.    . clindamycin (CLEOCIN) 150 MG capsule Take 150 mg by mouth 3 (three) times daily.    . cloNIDine (CATAPRES) 0.1 MG tablet Take 0.1 mg by mouth 2 (two) times daily.    . cloNIDine (CATAPRES) 0.2 MG tablet Take 0.2 mg by mouth 2 (  two) times daily.    . clopidogrel (PLAVIX) 75 MG tablet Take 1 tablet (75 mg total) by mouth daily. 30 tablet 11  . colchicine 0.6 MG tablet 1 tablet twice daily as needed for gout    . doxycycline (VIBRA-TABS) 100 MG tablet Take by mouth.    . furosemide (LASIX) 40 MG tablet TAKE 1 TABLET BY MOUTH TWICE DAILY FOR 3 DAYS, THEN  REDUCE  TO  1  TABLET  DAILY    . gabapentin (NEURONTIN) 100 MG capsule Take by mouth.    . indomethacin (INDOCIN) 50 MG capsule Take 50 mg by mouth 3 (three) times daily.    Marland Kitchen lisinopril (ZESTRIL) 10 MG tablet Take 10 mg by mouth daily.    . metFORMIN (GLUCOPHAGE-XR) 500 MG 24 hr  tablet Take 500 mg by mouth every morning.    . methylPREDNISolone (MEDROL DOSEPAK) 4 MG TBPK tablet Take by mouth as directed.    . metoprolol succinate (TOPROL-XL) 100 MG 24 hr tablet Take 100 mg by mouth daily. Take 4 tablets once a day    . nitroGLYCERIN (NITROSTAT) 0.4 MG SL tablet Place 0.4 mg under the tongue every 5 (five) minutes as needed for chest pain.     . potassium chloride (KLOR-CON) 10 MEQ tablet Take by mouth.    . predniSONE (DELTASONE) 20 MG tablet Take 40 mg by mouth daily.    . pregabalin (LYRICA) 75 MG capsule Take 75 mg by mouth 2 (two) times daily.     . Semaglutide, 1 MG/DOSE, (OZEMPIC, 1 MG/DOSE,) 2 MG/1.5ML SOPN Inject 1 mg into the skin once a week. Takes on Friday     Current Facility-Administered Medications on File Prior to Visit  Medication Dose Route Frequency Provider Last Rate Last Admin  . labetalol (NORMODYNE) tablet 200 mg  200 mg Oral Once Sherren Kerns, MD        Allergies  Allergen Reactions  . Ibuprofen     Other reaction(s): Other (See Comments) Contraindicated by his CKD    Objective:  General: Alert and oriented x3 in no acute distress  Dermatology: No open lesions bilateral lower extremities, no webspace macerations, no ecchymosis bilateral, all nails x 10 are well manicured.  Vascular: Dorsalis Pedis and Posterior Tibial pedal pulses faintly palpable, Capillary Fill Time 3 seconds,(-) pedal hair growth bilateral, trace edema bilateral lower extremities, Temperature gradient within normal limits.  Neurology: Michaell Cowing sensation intact via light touch bilateral.  Musculoskeletal: Pain to right lateral ankle, + limited range of motion bilateral ankles right worse than left with no frank instability however there is weakness with manual muscle testing.  Assessment and Plan: Problem List Items Addressed This Visit   None   Visit Diagnoses    Capsulitis of ankle, right    -  Primary   Tendonitis       Unstable ankle, unspecified  laterality       Arthritis of ankle       Diabetic polyneuropathy associated with type 2 diabetes mellitus (HCC)       Bilateral ankle joint pain       Mobility impaired           -Complete examination performed -Previous Xrays reviewed -Discussed treatment options -After oral consent and aseptic prep, injected a mixture containing 1 ml of 2%  plain lidocaine, 1 ml 0.5% plain marcaine, 0.5 ml of kenalog 10 and 0.5 ml of dexamethasone phosphate into right lateral ankle without complication. Post-injection care discussed with patient.  -  Continue with anklet ganulet -Advised patient if pain is no better call office in 2 weeks to proceed with MRI -Patient to return to office if pain fails to improve or sooner if condition worsens.  Asencion Islam, DPM

## 2020-01-29 DIAGNOSIS — R066 Hiccough: Secondary | ICD-10-CM | POA: Insufficient documentation

## 2020-02-01 DIAGNOSIS — R9389 Abnormal findings on diagnostic imaging of other specified body structures: Secondary | ICD-10-CM | POA: Insufficient documentation

## 2020-02-24 DIAGNOSIS — I7121 Aneurysm of the ascending aorta, without rupture: Secondary | ICD-10-CM | POA: Insufficient documentation

## 2020-03-18 ENCOUNTER — Telehealth (HOSPITAL_COMMUNITY): Payer: Self-pay

## 2020-03-18 NOTE — Telephone Encounter (Signed)
I received long term disability forms from [facility] [employer] from [employer] @ [phone]. I faxed information back to her that we don't fill out long term disability forms.   [employer]

## 2020-04-29 ENCOUNTER — Encounter: Payer: Self-pay | Admitting: Sports Medicine

## 2020-04-29 ENCOUNTER — Other Ambulatory Visit: Payer: Self-pay

## 2020-04-29 ENCOUNTER — Ambulatory Visit: Payer: Managed Care, Other (non HMO) | Admitting: Sports Medicine

## 2020-04-29 DIAGNOSIS — E1142 Type 2 diabetes mellitus with diabetic polyneuropathy: Secondary | ICD-10-CM

## 2020-04-29 DIAGNOSIS — M779 Enthesopathy, unspecified: Secondary | ICD-10-CM

## 2020-04-29 DIAGNOSIS — M19079 Primary osteoarthritis, unspecified ankle and foot: Secondary | ICD-10-CM

## 2020-04-29 DIAGNOSIS — M25373 Other instability, unspecified ankle: Secondary | ICD-10-CM

## 2020-04-29 DIAGNOSIS — M7751 Other enthesopathy of right foot: Secondary | ICD-10-CM

## 2020-04-29 MED ORDER — TRIAMCINOLONE ACETONIDE 10 MG/ML IJ SUSP
10.0000 mg | Freq: Once | INTRAMUSCULAR | Status: AC
  Administered 2020-04-29: 10 mg

## 2020-04-29 NOTE — Progress Notes (Signed)
Subjective:  [name] [name] is a 65 y.o. male patient who presents to office for follow-up evaluation of right ankle pain. Patient complains of continued pain in the ankle that has flared back up and reports that pain was gone for about 3 months and then last week the pain started to come back. Patient denies any new trauma or or injury reports that he was working over the weekend in his yard but the pain was already present before he started to do his yard work states that the pain limits his ability to walk 6 out of 10 sharp in nature with swelling and weakness states that he has been using his walker and ankle brace without complete relief. Patient denies any other pedal complaints.   Patient Active Problem List   Diagnosis Date Noted  . 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 Cellulitis of left anterior lower leg 09/12/2019  . Pleural effusion 07/04/2019  . CAD in native artery 05/18/2019  . S/P CABG (coronary artery bypass graft) 04/30/2019  . NSTEMI (non-ST elevated myocardial infarction) (HCC) 04/27/2019  . Cellulitis of left forearm 09/27/2018  . Dyslipidemia 09/27/2018  . Fall against sharp object 09/27/2018  . History of CVA (cerebrovascular accident) 09/27/2018  . Injury by nail 09/27/2018  . Injury, forearm and wrist, left, initial encounter 09/27/2018  . Prominent metatarsal head of right foot 05/16/2018  . Capsulitis of metatarsophalangeal (MTP) joint of right foot 04/04/2018  . Plantar fat pad atrophy of right foot 04/04/2018  . PAD (peripheral artery disease) (HCC) 03/11/2017  . Gait disturbance, post-stroke 12/10/2016  . Anomic dysphasia 12/10/2016  . Anxiety state   . Hypertensive crisis   . Acute idiopathic gout of right knee   . Stroke due to embolism of left middle cerebral artery (HCC) 11/12/2016  . Shortness of breath   . SOB (shortness of breath)   . Left hemiparesis (HCC)   . Acute pain of right knee   . Benign essential HTN   . Stage 3  chronic kidney disease (HCC)   . Newly diagnosed diabetes (HCC)   . Chest pain   . Atypical chest pain 10/14/2016  . Abnormal EKG 09/10/2016  . Sleep disorder 09/10/2016  . Benign essential hypertension 09/10/2016  . Elevated cholesterol 09/10/2016  . Intracranial atherosclerosis 04/29/2016  . OSA (obstructive sleep apnea) 04/29/2016  . Alcohol use 04/28/2016  . HTN (hypertension), malignant 04/28/2016  . Obesity (BMI 30.0-34.9) 04/28/2016  . Stress reaction 04/28/2016  . New onset type 2 diabetes mellitus (HCC) 04/27/2016  . AKI (acute kidney injury) (HCC) 04/26/2016  . Ataxia 04/26/2016  . Confusion 04/26/2016  . Gout 04/26/2016    Current Outpatient Medications on File Prior to Visit  Medication Sig Dispense Refill  . allopurinol (ZYLOPRIM) 100 MG tablet Take 100 mg by mouth daily.    Marland Kitchen allopurinol (ZYLOPRIM) 300 MG tablet Take 1 tablet (300 mg total) by mouth daily. 30 tablet 0  . ALPRAZolam (XANAX) 0.25 MG tablet Take by mouth.    Marland Kitchen amLODipine (NORVASC) 10 MG tablet Take 1 tablet (10 mg total) by mouth daily. 30 tablet 0  . amLODipine (NORVASC) 5 MG tablet Take 5 mg by mouth daily.    Marland Kitchen aspirin EC 81 MG tablet Take 81 mg daily by mouth.    Marland Kitchen atorvastatin (LIPITOR) 80 MG tablet Take by mouth.    . betamethasone dipropionate 0.05 % cream Apply topically 2 (two) times daily as needed.    . chlorproMAZINE (  THORAZINE) 25 MG tablet Take 25 mg by mouth 3 (three) times daily as needed.    . cholecalciferol (VITAMIN D3) 10 MCG (400 UNIT) TABS tablet Take 800 Units by mouth.    . clindamycin (CLEOCIN) 150 MG capsule Take 150 mg by mouth 3 (three) times daily.    . cloNIDine (CATAPRES) 0.1 MG tablet Take 0.1 mg by mouth 2 (two) times daily.    . cloNIDine (CATAPRES) 0.2 MG tablet Take 0.2 mg by mouth 2 (two) times daily.    . clopidogrel (PLAVIX) 75 MG tablet Take 1 tablet (75 mg total) by mouth daily. 30 tablet 11  . colchicine 0.6 MG tablet 1 tablet twice daily as needed for gout     . doxycycline (VIBRA-TABS) 100 MG tablet Take by mouth.    . furosemide (LASIX) 40 MG tablet TAKE 1 TABLET BY MOUTH TWICE DAILY FOR 3 DAYS, THEN  REDUCE  TO  1  TABLET  DAILY    . gabapentin (NEURONTIN) 100 MG capsule Take by mouth.    . indomethacin (INDOCIN) 50 MG capsule Take 50 mg by mouth 3 (three) times daily.    Marland Kitchen lisinopril (ZESTRIL) 10 MG tablet Take 10 mg by mouth daily.    . metFORMIN (GLUCOPHAGE-XR) 500 MG 24 hr tablet Take 500 mg by mouth every morning.    . methylPREDNISolone (MEDROL DOSEPAK) 4 MG TBPK tablet Take by mouth as directed.    . metoprolol succinate (TOPROL-XL) 100 MG 24 hr tablet Take 100 mg by mouth daily. Take 4 tablets once a day    . nitroGLYCERIN (NITROSTAT) 0.4 MG SL tablet Place 0.4 mg under the tongue every 5 (five) minutes as needed for chest pain.     . potassium chloride (KLOR-CON) 10 MEQ tablet Take by mouth.    . predniSONE (DELTASONE) 20 MG tablet Take 40 mg by mouth daily.    . pregabalin (LYRICA) 75 MG capsule Take 75 mg by mouth 2 (two) times daily.     . Semaglutide, 1 MG/DOSE, (OZEMPIC, 1 MG/DOSE,) 2 MG/1.5ML SOPN Inject 1 mg into the skin once a week. Takes on Friday     Current Facility-Administered Medications on File Prior to Visit  Medication Dose Route Frequency Provider Last Rate Last Admin  . labetalol (NORMODYNE) tablet 200 mg  200 mg Oral Once Sherren Kerns, MD        Allergies  Allergen Reactions  . Ibuprofen     Other reaction(s): Other (See Comments) Contraindicated by his CKD    Objective:  General: Alert and oriented x3 in no acute distress  Dermatology: No open lesions bilateral lower extremities, no webspace macerations, no ecchymosis bilateral, all nails x 10 are well manicured.  There is a small raised pustule noted at the anterior ankle on the right likely bug bite with no acute signs of infection.  Vascular: Dorsalis Pedis and Posterior Tibial pedal pulses faintly palpable, Capillary Fill Time 3 seconds,(-) pedal  hair growth bilateral, 1+ pitting bilateral lower extremities, Temperature gradient within normal limits.  Neurology: Michaell Cowing sensation intact via light touch bilateral.  Musculoskeletal: Pain to right lateral ankle, + limited range of motion bilateral ankles right worse than left with no frank instability however there is weakness with manual muscle testing.  Assessment and Plan: Problem List Items Addressed This Visit   None   Visit Diagnoses    Capsulitis of ankle, right    -  Primary   Relevant Medications   triamcinolone acetonide (KENALOG) 10  MG/ML injection 10 mg (Completed) (Start on 04/29/2020  5:00 PM)   Tendonitis       Unstable ankle, unspecified laterality       Arthritis of ankle       Diabetic polyneuropathy associated with type 2 diabetes mellitus (HCC)           -Complete examination performed -Re-Discussed treatment options continued right ankle pain -After oral consent and aseptic prep, injected a mixture containing 1 ml of 2%  plain lidocaine, 1 ml 0.5% plain marcaine, 0.5 ml of kenalog 10 and 0.5 ml of dexamethasone phosphate into right lateral ankle without complication. Post-injection care discussed with patient.  This is injection #2 to the area. -Advised patient that if this repeat injection does not help call office for [place] to proceed with ordering a MRI after 2 weeks -Continue with anklet ganulet or good supportive shoe -Dispense surgigrip compression sleeve for right ankle -At no additional charge applied antibiotic cream and Band-Aid to pustule and advised patient to monitor if worsens to call office or go to urgent care for assessment -Encouraged elevation to assist with edema control and advised patient that he may need to resume taking his fluid pills to help with swelling -Return as directed or call office for [place] to order an MRI of the right ankle if symptoms are no better after 2 weeks.  Asencion Islam, DPM

## 2020-05-06 ENCOUNTER — Ambulatory Visit: Payer: Managed Care, Other (non HMO) | Admitting: Sports Medicine

## 2020-07-09 ENCOUNTER — Encounter: Payer: Self-pay | Admitting: Family Medicine

## 2020-07-09 ENCOUNTER — Ambulatory Visit (INDEPENDENT_AMBULATORY_CARE_PROVIDER_SITE_OTHER): Payer: Self-pay | Admitting: Family Medicine

## 2020-07-09 ENCOUNTER — Other Ambulatory Visit: Payer: Self-pay

## 2020-07-09 DIAGNOSIS — G8929 Other chronic pain: Secondary | ICD-10-CM

## 2020-07-09 DIAGNOSIS — R609 Edema, unspecified: Secondary | ICD-10-CM | POA: Insufficient documentation

## 2020-07-09 DIAGNOSIS — M545 Low back pain, unspecified: Secondary | ICD-10-CM

## 2020-07-09 NOTE — Progress Notes (Signed)
Office Visit Note   Patient: [name] [name]           Date of Birth: 03/15/1955           MRN: [id number] Visit Date: 07/09/2020 Requested by: Hadley Pen, MD [address] [place],  [place] [address] PCP: Hadley Pen, MD  Subjective: Chief Complaint  Patient presents with   Lower Back - Pain    Chronic low back pain. "Progressive problem." NKI.  Had been seen by an orthopedist in [place] (brought a CD from last year). Has also been seen at [facility] in [place] -- they tried ESIs. Did not have any PT for his back. Here today to see if there are any other options for him. He also needed to change physicians when his insurance changed to [employer]. Today while standing, it felt like someone was twisting something in the lower back, mainly on the right side.     HPI: He is here with low back pain.  Symptoms started a couple years ago, no injury.  Progressively worsening pain in the midline lumbosacral area.  He cannot stand or walk very long before having to sit down.  He has been to another orthopedic clinic and also to [facility].  He has had 4 injections, none of which seem to help.  He is currently on Lyrica but that does not seem to help.  He went to a physical therapist for some dry needling and that gave him only temporary relief.  Denies bowel or bladder dysfunction.  Denies any numbness in his leg but he does have some weakness bilaterally due to disuse.  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  He brought an MRI with him on CD from last year.  He was told at one point that surgery might be an option but he is very hesitant to consider surgery.                ROS: He has diabetes.  All other systems were reviewed and are negative.  Objective: Vital Signs: There were no vitals taken for this visit.  Physical Exam:  General:  Alert and oriented, in no acute  distress. Pulm:  Breathing unlabored. Psy:  Normal mood, congruent affect.  Low back: He is tender primarily near the L5-S1 level in the midline and near the right SI joint.  No pain with sciatic notch.  No pain with right leg raise, no pain with internal hip rotation.  Lower extremity strength and reflexes are still normal.  He has bilateral 2+ pitting edema in the legs with skin ulcerations.   Imaging: MRI images brought with him on CD reveal disc bulging at several levels.   Assessment & Plan: Chronic low back pain with suspected spinal stenosis -Discussed options with him and elected to try chiropractic per Dr. Herschel Senegal in [place]. -If he fails to improve, could contemplate another trial of physical therapy. - Encouraged him to work on diabetes control. - Try glucosamine, turmeric.  Already on vitamiin D.     Procedures: No procedures performed        PMFS History: Patient Active Problem List   Diagnosis Date Noted   Peripheral edema 07/09/2020   Thoracic ascending aortic aneurysm (HCC) 02/24/2020   Abnormal finding on chest xray 02/01/2020   Intractable hiccups 01/29/2020   Idiopathic chronic gout of  ankle without tophus 10/24/2019   Cellulitis of left anterior lower leg 09/12/2019   Pleural effusion 07/04/2019   CAD in native artery 05/18/2019   S/P CABG (coronary artery bypass graft) 04/30/2019   NSTEMI (non-ST elevated myocardial infarction) (HCC) 04/27/2019   Cellulitis of left forearm 09/27/2018   Dyslipidemia 09/27/2018   Fall against sharp object 09/27/2018   History of CVA (cerebrovascular accident) 09/27/2018   Injury by nail 09/27/2018   Injury, forearm and wrist, left, initial encounter 09/27/2018   Prominent metatarsal head of right foot 05/16/2018   Capsulitis of metatarsophalangeal (MTP) joint of right foot 04/04/2018   Plantar fat pad atrophy of right foot 04/04/2018   PAD (peripheral artery disease) (HCC) 03/11/2017   Gait disturbance, post-stroke  12/10/2016   Anomic dysphasia 12/10/2016   Anxiety state    Hypertensive crisis    Acute idiopathic gout of right knee    Stroke due to embolism of left middle cerebral artery (HCC) 11/12/2016   Shortness of breath    SOB (shortness of breath)    Left hemiparesis (HCC)    Acute pain of right knee    Benign essential HTN    Stage 3 chronic kidney disease (HCC)    Newly diagnosed diabetes (HCC)    Chest pain    Atypical chest pain 10/14/2016   Abnormal EKG 09/10/2016   Sleep disorder 09/10/2016   Benign essential hypertension 09/10/2016   Elevated cholesterol 09/10/2016   Intracranial atherosclerosis 04/29/2016   OSA (obstructive sleep apnea) 04/29/2016   Alcohol use 04/28/2016   HTN (hypertension), malignant 04/28/2016   Obesity (BMI 30.0-34.9) 04/28/2016   Stress reaction 04/28/2016   New onset type 2 diabetes mellitus (HCC) 04/27/2016   AKI (acute kidney injury) (HCC) 04/26/2016   Ataxia 04/26/2016   Confusion 04/26/2016   Gout 04/26/2016   Past Medical History:  Diagnosis Date   Cellulitis    Chronic kidney disease    CVA (cerebral vascular accident) (HCC) 2014   Diabetes mellitus without complication (HCC)    Gout    Hypertension    Obesity (BMI 30.0-34.9)    Sleep disorder     Family History  Problem Relation Age of Onset   Hypertension Mother    Hypertension Father    Colon cancer Paternal Grandmother     Past Surgical History:  Procedure Laterality Date   Arthroscopic knee surgery     PERIPHERAL VASCULAR INTERVENTION Right 03/11/2017   Procedure: PERIPHERAL VASCULAR INTERVENTION;  Surgeon: Sherren Kerns, MD;  Location: [facility];  Service: Cardiovascular;  Laterality: Right;  right renal artery stent   RENAL ANGIOGRAPHY N/A 03/11/2017   Procedure: RENAL ANGIOGRAPHY;  Surgeon: Sherren Kerns, MD;  Location: [facility];  Service: Cardiovascular;  Laterality: N/A;   TONSILLECTOMY     Social History   Occupational History   Not on file   Tobacco Use   Smoking status: Never   Smokeless tobacco: Never  Vaping Use   Vaping Use: Never used  Substance and Sexual Activity   Alcohol use: Yes   Drug use: No   Sexual activity: Not on file

## 2020-07-10 ENCOUNTER — Telehealth: Payer: Self-pay | Admitting: Family Medicine

## 2020-07-10 DIAGNOSIS — M545 Low back pain, unspecified: Secondary | ICD-10-CM

## 2020-07-10 DIAGNOSIS — G8929 Other chronic pain: Secondary | ICD-10-CM

## 2020-07-10 NOTE — Telephone Encounter (Signed)
The patient would like to go to physical therapy at [facility] in [place], fax #[phone].

## 2020-07-10 NOTE — Telephone Encounter (Signed)
Patient called requesting a referral for physical therapy. Please call pt about this matter at [phone].

## 2020-07-10 NOTE — Telephone Encounter (Signed)
I called and left a voice message to call back to let us know if he has a preference as to where he receives physical therapy.

## 2020-07-21 ENCOUNTER — Other Ambulatory Visit: Payer: Self-pay | Admitting: Internal Medicine

## 2020-07-21 ENCOUNTER — Other Ambulatory Visit (HOSPITAL_COMMUNITY): Payer: Self-pay | Admitting: Internal Medicine

## 2020-07-21 DIAGNOSIS — M79605 Pain in left leg: Secondary | ICD-10-CM

## 2020-07-21 DIAGNOSIS — M7989 Other specified soft tissue disorders: Secondary | ICD-10-CM

## 2020-07-22 ENCOUNTER — Other Ambulatory Visit: Payer: Self-pay

## 2020-07-22 ENCOUNTER — Ambulatory Visit (INDEPENDENT_AMBULATORY_CARE_PROVIDER_SITE_OTHER): Payer: 59

## 2020-07-22 DIAGNOSIS — M7989 Other specified soft tissue disorders: Secondary | ICD-10-CM | POA: Diagnosis not present

## 2020-07-22 DIAGNOSIS — M79605 Pain in left leg: Secondary | ICD-10-CM

## 2020-07-22 NOTE — Progress Notes (Signed)
Unilateral left lower extremity venous duplex exam performed.  Jimmy Anshika Pethtel RDCS, RVT 

## 2020-07-23 ENCOUNTER — Ambulatory Visit: Payer: Self-pay | Admitting: Sports Medicine

## 2020-07-23 DIAGNOSIS — M7751 Other enthesopathy of right foot: Secondary | ICD-10-CM

## 2020-07-23 DIAGNOSIS — M19079 Primary osteoarthritis, unspecified ankle and foot: Secondary | ICD-10-CM

## 2020-07-23 DIAGNOSIS — E1142 Type 2 diabetes mellitus with diabetic polyneuropathy: Secondary | ICD-10-CM

## 2020-07-23 DIAGNOSIS — M779 Enthesopathy, unspecified: Secondary | ICD-10-CM

## 2020-07-23 DIAGNOSIS — M25373 Other instability, unspecified ankle: Secondary | ICD-10-CM

## 2020-07-23 MED ORDER — TRIAMCINOLONE ACETONIDE 40 MG/ML IJ SUSP
20.0000 mg | Freq: Once | INTRAMUSCULAR | Status: AC
  Administered 2020-07-23: 20 mg

## 2020-07-23 NOTE — Progress Notes (Signed)
Subjective:  [name] [name] is a 65 y.o. male patient who presents to office for follow-up evaluation of right ankle pain. Patient complains of continued pain in the ankle that has flared back up and reports injection helps but pain now is about the same 9 out of 10 and is worse but on average 6 out of 10 and states that his PCP told him to resume his Lasix for the swelling and has been elevating and using his brace to help.    Patient Active Problem List   Diagnosis Date Noted   Peripheral edema 07/09/2020   Thoracic ascending aortic aneurysm (HCC) 02/24/2020   Abnormal finding on chest xray 02/01/2020   Intractable hiccups 01/29/2020   Idiopathic chronic gout of ankle without tophus 10/24/2019   Cellulitis of left anterior lower leg 09/12/2019   Pleural effusion 07/04/2019   CAD in native artery 05/18/2019   S/P CABG (coronary artery bypass graft) 04/30/2019   NSTEMI (non-ST elevated myocardial infarction) (HCC) 04/27/2019   Cellulitis of left forearm 09/27/2018   Dyslipidemia 09/27/2018   Fall against sharp object 09/27/2018   History of CVA (cerebrovascular accident) 09/27/2018   Injury by nail 09/27/2018   Injury, forearm and wrist, left, initial encounter 09/27/2018   Prominent metatarsal head of right foot 05/16/2018   Capsulitis of metatarsophalangeal (MTP) joint of right foot 04/04/2018   Plantar fat pad atrophy of right foot 04/04/2018   PAD (peripheral artery disease) (HCC) 03/11/2017   Gait disturbance, post-stroke 12/10/2016   Anomic dysphasia 12/10/2016   Anxiety state    Hypertensive crisis    Acute idiopathic gout of right knee    Stroke due to embolism of left middle cerebral artery (HCC) 11/12/2016   Shortness of breath    SOB (shortness of breath)    Left hemiparesis (HCC)    Acute pain of right knee    Benign essential HTN    Stage 3 chronic kidney disease (HCC)    Newly diagnosed diabetes (HCC)    Chest pain    Atypical chest pain 10/14/2016   Abnormal  EKG 09/10/2016   Sleep disorder 09/10/2016   Benign essential hypertension 09/10/2016   Elevated cholesterol 09/10/2016   Intracranial atherosclerosis 04/29/2016   OSA (obstructive sleep apnea) 04/29/2016   Alcohol use 04/28/2016   HTN (hypertension), malignant 04/28/2016   Obesity (BMI 30.0-34.9) 04/28/2016   Stress reaction 04/28/2016   New onset type 2 diabetes mellitus (HCC) 04/27/2016   AKI (acute kidney injury) (HCC) 04/26/2016   Ataxia 04/26/2016   Confusion 04/26/2016   Gout 04/26/2016    Current Outpatient Medications on File Prior to Visit  Medication Sig Dispense Refill   allopurinol (ZYLOPRIM) 300 MG tablet Take 1 tablet (300 mg total) by mouth daily. 30 tablet 0   amLODipine (NORVASC) 10 MG tablet Take 1 tablet (10 mg total) by mouth daily. 30 tablet 0   aspirin EC 81 MG tablet Take 81 mg daily by mouth.     atorvastatin (LIPITOR) 80 MG tablet Take by mouth.     betamethasone dipropionate 0.05 % cream Apply topically 2 (two) times daily as needed.     cholecalciferol (VITAMIN D3) 10 MCG (400 UNIT) TABS tablet Take 800 Units by mouth.     cloNIDine (CATAPRES) 0.2 MG tablet Take 0.2 mg by mouth 2 (two) times daily.     clopidogrel (PLAVIX) 75 MG tablet Take 1 tablet (75 mg total) by mouth daily. 30 tablet 11   colchicine 0.6 MG tablet 1  tablet twice daily as needed for gout (Patient not taking: Reported on 07/09/2020)     furosemide (LASIX) 40 MG tablet TAKE 1 TABLET BY MOUTH TWICE DAILY FOR 3 DAYS, THEN  REDUCE  TO  1  TABLET  DAILY     indomethacin (INDOCIN) 50 MG capsule Take 50 mg by mouth 3 (three) times daily.     lisinopril (ZESTRIL) 10 MG tablet Take 10 mg by mouth daily.     metFORMIN (GLUCOPHAGE) 1000 MG tablet Take 1,000 mg by mouth 2 (two) times daily.     metoprolol succinate (TOPROL-XL) 100 MG 24 hr tablet Take 100 mg by mouth daily. Take 4 tablets once a day     mupirocin ointment (BACTROBAN) 2 % Apply topically 3 (three) times daily.     nitroGLYCERIN  (NITROSTAT) 0.4 MG SL tablet Place 0.4 mg under the tongue every 5 (five) minutes as needed for chest pain.      pregabalin (LYRICA) 75 MG capsule Take 75 mg by mouth 2 (two) times daily.      Current Facility-Administered Medications on File Prior to Visit  Medication Dose Route Frequency Provider Last Rate Last Admin   labetalol (NORMODYNE) tablet 200 mg  200 mg Oral Once Sherren Kerns, MD        Allergies  Allergen Reactions   Ibuprofen     Other reaction(s): Other (See Comments) Contraindicated by his CKD    Objective:  General: Alert and oriented x3 in no acute distress  Dermatology: No open lesions bilateral lower extremities, no webspace macerations, no ecchymosis bilateral, all nails x 10 are well manicured.   Vascular: Dorsalis Pedis and Posterior Tibial pedal pulses faintly palpable, Capillary Fill Time 3 seconds,(-) pedal hair growth bilateral, 1+ pitting bilateral lower extremities, Temperature gradient within normal limits.   Neurology: Michaell Cowing sensation intact via light touch bilateral.   Musculoskeletal: Pain to right lateral ankle over the lateral ankle ligaments, + limited range of motion bilateral ankles right worse than left with no frank instability however there is weakness with manual muscle testing.  Assessment and Plan: Problem List Items Addressed This Visit   None Visit Diagnoses     Capsulitis of ankle, right    -  Primary   Tendonitis       Unstable ankle, unspecified laterality       Arthritis of ankle       Diabetic polyneuropathy associated with type 2 diabetes mellitus (HCC)            -Complete examination performed -Re-Discussed treatment options continued right ankle pain -After oral consent and aseptic prep, injected a mixture containing 1 ml of 2%  plain lidocaine, 1 ml 0.5% plain marcaine, 0.5 ml of kenalog 10 and 0.5 ml of dexamethasone phosphate into right lateral ankle without complication. Post-injection care discussed with  patient.  This is injection #3 to the area. -Advised patient if continues to have pain may benefit from MRI -Continue with anklet ganulet or good supportive shoes -Continue with Lasix as prescribed by PCP for edema control -Return as needed or call if pain is no better for me to order MRI.   Asencion Islam, DPM

## 2020-09-16 ENCOUNTER — Other Ambulatory Visit: Payer: Self-pay

## 2020-09-16 DIAGNOSIS — I739 Peripheral vascular disease, unspecified: Secondary | ICD-10-CM

## 2020-09-24 ENCOUNTER — Ambulatory Visit (INDEPENDENT_AMBULATORY_CARE_PROVIDER_SITE_OTHER): Payer: 59 | Admitting: Vascular Surgery

## 2020-09-24 ENCOUNTER — Encounter: Payer: Self-pay | Admitting: Vascular Surgery

## 2020-09-24 ENCOUNTER — Other Ambulatory Visit: Payer: Self-pay

## 2020-09-24 ENCOUNTER — Ambulatory Visit (HOSPITAL_COMMUNITY)
Admission: RE | Admit: 2020-09-24 | Discharge: 2020-09-24 | Disposition: A | Discharge status: Home / Self Care | Payer: 59 | Source: Ambulatory Visit | Attending: Vascular Surgery | Admitting: Vascular Surgery

## 2020-09-24 ENCOUNTER — Ambulatory Visit (INDEPENDENT_AMBULATORY_CARE_PROVIDER_SITE_OTHER)
Admission: RE | Admit: 2020-09-24 | Discharge: 2020-09-24 | Disposition: A | Discharge status: Home / Self Care | Payer: 59 | Source: Ambulatory Visit | Attending: Vascular Surgery | Admitting: Vascular Surgery

## 2020-09-24 VITALS — BP 174/117 | HR 81 | Temp 98.2°F | Resp 20 | Ht 70.0 in | Wt 216.0 lb

## 2020-09-24 DIAGNOSIS — I739 Peripheral vascular disease, unspecified: Secondary | ICD-10-CM

## 2020-09-24 NOTE — Progress Notes (Signed)
Patient ID: [name] [name], male   DOB: Jan 27, 1955, 65 y.o.   MRN: [id number]  Reason for Consult: Follow-up   Referred by Hadley Pen, MD  Subjective:     HPI:  [name] [name] is a 65 y.o. male previously seen for Dr. Darrick Penna for cellulitis in both legs.  Since his visit he has undergone coronary artery bypass surgery.  He had harvest of his left greater saphenous vein per his report he does appear to be a good historian.  He is currently on Plavix and aspirin.  Does have a history of right renal artery stenting.  Patient has swelling of his bilateral lower extremities with red discoloration.  He does have a scrape on his right leg which has been nonhealing.  He denies any claudication at this time.  No previous history of DVT or vein intervention other than saphenous vein harvest for coronary artery bypass grafting.  Past Medical History:  Diagnosis Date   Cellulitis    Chronic kidney disease    CVA (cerebral vascular accident) (HCC) 2014   Diabetes mellitus without complication (HCC)    Gout    Hypertension    Obesity (BMI 30.0-34.9)    Sleep disorder    Family History  Problem Relation Age of Onset   Hypertension Mother    Hypertension Father    Colon cancer Paternal Grandmother    Past Surgical History:  Procedure Laterality Date   Arthroscopic knee surgery     PERIPHERAL VASCULAR INTERVENTION Right 03/11/2017   Procedure: PERIPHERAL VASCULAR INTERVENTION;  Surgeon: Sherren Kerns, MD;  Location: [facility];  Service: Cardiovascular;  Laterality: Right;  right renal artery stent   RENAL ANGIOGRAPHY N/A 03/11/2017   Procedure: RENAL ANGIOGRAPHY;  Surgeon: Sherren Kerns, MD;  Location: [facility];  Service: Cardiovascular;  Laterality: N/A;   TONSILLECTOMY      Short Social History:  Social History   Tobacco Use   Smoking status: Never   Smokeless tobacco: Never  Substance Use Topics   Alcohol use: Yes    Allergies  Allergen Reactions    Ibuprofen     Other reaction(s): Other (See Comments) Contraindicated by his CKD    Current Outpatient Medications  Medication Sig Dispense Refill   allopurinol (ZYLOPRIM) 300 MG tablet Take 1 tablet (300 mg total) by mouth daily. 30 tablet 0   amLODipine (NORVASC) 10 MG tablet Take 1 tablet (10 mg total) by mouth daily. 30 tablet 0   aspirin EC 81 MG tablet Take 81 mg daily by mouth.     atorvastatin (LIPITOR) 80 MG tablet Take by mouth.     betamethasone dipropionate 0.05 % cream Apply topically 2 (two) times daily as needed.     cholecalciferol (VITAMIN D3) 10 MCG (400 UNIT) TABS tablet Take 800 Units by mouth.     cloNIDine (CATAPRES) 0.2 MG tablet Take 0.2 mg by mouth 2 (two) times daily.     clopidogrel (PLAVIX) 75 MG tablet Take 1 tablet (75 mg total) by mouth daily. 30 tablet 11   furosemide (LASIX) 40 MG tablet TAKE 1 TABLET BY MOUTH TWICE DAILY FOR 3 DAYS, THEN  REDUCE  TO  1  TABLET  DAILY     indomethacin (INDOCIN) 50 MG capsule Take 50 mg by mouth 3 (three) times daily.     lisinopril (ZESTRIL) 10 MG tablet Take 10 mg by mouth daily.     metFORMIN (GLUCOPHAGE) 1000 MG tablet Take 1,000  mg by mouth 2 (two) times daily.     metoprolol succinate (TOPROL-XL) 100 MG 24 hr tablet Take 100 mg by mouth daily. Take 4 tablets once a day     mupirocin ointment (BACTROBAN) 2 % Apply topically 3 (three) times daily.     nitroGLYCERIN (NITROSTAT) 0.4 MG SL tablet Place 0.4 mg under the tongue every 5 (five) minutes as needed for chest pain.      pregabalin (LYRICA) 75 MG capsule Take 75 mg by mouth 2 (two) times daily.      colchicine 0.6 MG tablet 1 tablet twice daily as needed for gout (Patient not taking: No sig reported)     Current Facility-Administered Medications  Medication Dose Route Frequency Provider Last Rate Last Admin   labetalol (NORMODYNE) tablet 200 mg  200 mg Oral Once Sherren Kerns, MD        Review of Systems  Constitutional:  Constitutional negative. HENT: HENT  negative.  Eyes: Eyes negative.  Cardiovascular: Positive for leg swelling.  GI: Gastrointestinal negative.  Musculoskeletal: Musculoskeletal negative.  Skin: Positive for wound.  Neurological: Neurological negative. Hematologic: Hematologic/lymphatic negative.  Psychiatric: Psychiatric negative.       Objective:  Objective   Vitals:   09/24/20 0927  BP: (!) 174/117  Pulse: 81  Resp: 20  Temp: 98.2 F (36.8 C)  SpO2: 96%  Weight: 216 lb (98 kg)  Height: 5\' 10"  (1.778 m)   Body mass index is 30.99 kg/m.  Physical Exam HENT:     Head: Normocephalic.     Nose:     Comments: Wearing a mask Neck:     Vascular: No carotid bruit.  Cardiovascular:     Rate and Rhythm: Normal rate.     Pulses:          Popliteal pulses are 2+ on the right side and 2+ on the left side.       Dorsalis pedis pulses are 0 on the right side and 0 on the left side.       Posterior tibial pulses are 0 on the right side and 0 on the left side.  Abdominal:     General: Abdomen is flat.     Palpations: Abdomen is soft. There is no mass.  Musculoskeletal:     Right lower leg: Edema present.     Left lower leg: Edema present.  Skin:    Capillary Refill: Capillary refill takes 2 to 3 seconds.     Comments: Skin changes consistent with chronic venous insufficiency bilateral legs Right leg anterior 1 x 1 cm wound  Neurological:     General: No focal deficit present.     Mental Status: He is alert.  Psychiatric:        Mood and Affect: Mood normal.        Behavior: Behavior normal.        Thought Content: Thought content normal.        Judgment: Judgment normal.    Data: Current ABI: Rt ABI 0.87, Lt ABI [place]   Comparison Study: None   Performing Technologist:      Examination Guidelines: A complete evaluation includes B-mode imaging,  spectral  Doppler, color Doppler, and power Doppler as needed of all accessible  portions  of each vessel. Bilateral testing is considered an  integral part of a  complete  examination. Limited examinations for reoccurring indications may be  performed  as noted.        +-----------+--------+-----+---------------+----------+--------------------  -+  RIGHT      PSV cm/sRatioStenosis       Waveform  Comments                 +-----------+--------+-----+---------------+----------+--------------------  -+  CFA Prox   122                         triphasic                          +-----------+--------+-----+---------------+----------+--------------------  -+  DFA        220                         biphasic                           +-----------+--------+-----+---------------+----------+--------------------  -+  SFA Prox   171                         triphasic                          +-----------+--------+-----+---------------+----------+--------------------  -+  SFA Mid    155                         triphasic collateral  visualized  +-----------+--------+-----+---------------+----------+--------------------  -+  SFA Distal 180                         triphasic                          +-----------+--------+-----+---------------+----------+--------------------  -+  POP Mid    94                          triphasic                          +-----------+--------+-----+---------------+----------+--------------------  -+  ATA Distal 69                          monophasic                         +-----------+--------+-----+---------------+----------+--------------------  -+  PTA Mid    256          30-49% stenosis                                   +-----------+--------+-----+---------------+----------+--------------------  -+  PTA Distal 445          75-99% stenosismonophasic                         +-----------+--------+-----+---------------+----------+--------------------  -+  PERO Prox  14                                                              +-----------+--------+-----+---------------+----------+--------------------  -+  PERO  Distal12                          biphasic                           +-----------+--------+-----+---------------+----------+--------------------  -+       +-----------+--------+-----+---------------+---------+---------+  LEFT       PSV cm/sRatioStenosis       Waveform Comments   +-----------+--------+-----+---------------+---------+---------+  CFA Prox   133                         triphasic           +-----------+--------+-----+---------------+---------+---------+  DFA        110                         triphasic           +-----------+--------+-----+---------------+---------+---------+  SFA Prox   200                         triphasic           +-----------+--------+-----+---------------+---------+---------+  SFA Mid    140                         triphasic           +-----------+--------+-----+---------------+---------+---------+  SFA Distal 98                          triphasic           +-----------+--------+-----+---------------+---------+---------+  POP Mid    75                          triphasic           +-----------+--------+-----+---------------+---------+---------+  ATA Distal 50                          biphasic            +-----------+--------+-----+---------------+---------+---------+  PTA Distal 487          50-74% stenosistriphasicper ratio  +-----------+--------+-----+---------------+---------+---------+  PERO Distal38                          biphasic            +-----------+--------+-----+---------------+---------+---------+      Summary:  Right: 75-99% stenosis noted in the posterior tibial artery.   Left: 50-74% stenosis noted in the posterior tibial artery.      Assessment/Plan:     65-year-old male appears to have mixed venous and arterial ulceration bilateral lower extremities.   I discussed that given he has a wound we will plan for angiography from a left common femoral approach.  We will also need to schedule venous reflux studies to follow-up but he has had saphenous vein harvest for coronary artery bypass grafting.  We will get this set up in the near future for aortogram to start.     Finleigh Cheong [name] Aniel Hubble MD [facility]   

## 2020-09-24 NOTE — H&P (View-Only) (Signed)
Patient ID: [name] [name], male   DOB: Jan 27, 1955, 65 y.o.   MRN: [id number]  Reason for Consult: Follow-up   Referred by Hadley Pen, MD  Subjective:     HPI:  [name] [name] is a 65 y.o. male previously seen for Dr. Darrick Penna for cellulitis in both legs.  Since his visit he has undergone coronary artery bypass surgery.  He had harvest of his left greater saphenous vein per his report he does appear to be a good historian.  He is currently on Plavix and aspirin.  Does have a history of right renal artery stenting.  Patient has swelling of his bilateral lower extremities with red discoloration.  He does have a scrape on his right leg which has been nonhealing.  He denies any claudication at this time.  No previous history of DVT or vein intervention other than saphenous vein harvest for coronary artery bypass grafting.  Past Medical History:  Diagnosis Date   Cellulitis    Chronic kidney disease    CVA (cerebral vascular accident) (HCC) 2014   Diabetes mellitus without complication (HCC)    Gout    Hypertension    Obesity (BMI 30.0-34.9)    Sleep disorder    Family History  Problem Relation Age of Onset   Hypertension Mother    Hypertension Father    Colon cancer Paternal Grandmother    Past Surgical History:  Procedure Laterality Date   Arthroscopic knee surgery     PERIPHERAL VASCULAR INTERVENTION Right 03/11/2017   Procedure: PERIPHERAL VASCULAR INTERVENTION;  Surgeon: Sherren Kerns, MD;  Location: [facility];  Service: Cardiovascular;  Laterality: Right;  right renal artery stent   RENAL ANGIOGRAPHY N/A 03/11/2017   Procedure: RENAL ANGIOGRAPHY;  Surgeon: Sherren Kerns, MD;  Location: [facility];  Service: Cardiovascular;  Laterality: N/A;   TONSILLECTOMY      Short Social History:  Social History   Tobacco Use   Smoking status: Never   Smokeless tobacco: Never  Substance Use Topics   Alcohol use: Yes    Allergies  Allergen Reactions    Ibuprofen     Other reaction(s): Other (See Comments) Contraindicated by his CKD    Current Outpatient Medications  Medication Sig Dispense Refill   allopurinol (ZYLOPRIM) 300 MG tablet Take 1 tablet (300 mg total) by mouth daily. 30 tablet 0   amLODipine (NORVASC) 10 MG tablet Take 1 tablet (10 mg total) by mouth daily. 30 tablet 0   aspirin EC 81 MG tablet Take 81 mg daily by mouth.     atorvastatin (LIPITOR) 80 MG tablet Take by mouth.     betamethasone dipropionate 0.05 % cream Apply topically 2 (two) times daily as needed.     cholecalciferol (VITAMIN D3) 10 MCG (400 UNIT) TABS tablet Take 800 Units by mouth.     cloNIDine (CATAPRES) 0.2 MG tablet Take 0.2 mg by mouth 2 (two) times daily.     clopidogrel (PLAVIX) 75 MG tablet Take 1 tablet (75 mg total) by mouth daily. 30 tablet 11   furosemide (LASIX) 40 MG tablet TAKE 1 TABLET BY MOUTH TWICE DAILY FOR 3 DAYS, THEN  REDUCE  TO  1  TABLET  DAILY     indomethacin (INDOCIN) 50 MG capsule Take 50 mg by mouth 3 (three) times daily.     lisinopril (ZESTRIL) 10 MG tablet Take 10 mg by mouth daily.     metFORMIN (GLUCOPHAGE) 1000 MG tablet Take 1,000  mg by mouth 2 (two) times daily.     metoprolol succinate (TOPROL-XL) 100 MG 24 hr tablet Take 100 mg by mouth daily. Take 4 tablets once a day     mupirocin ointment (BACTROBAN) 2 % Apply topically 3 (three) times daily.     nitroGLYCERIN (NITROSTAT) 0.4 MG SL tablet Place 0.4 mg under the tongue every 5 (five) minutes as needed for chest pain.      pregabalin (LYRICA) 75 MG capsule Take 75 mg by mouth 2 (two) times daily.      colchicine 0.6 MG tablet 1 tablet twice daily as needed for gout (Patient not taking: No sig reported)     Current Facility-Administered Medications  Medication Dose Route Frequency Provider Last Rate Last Admin   labetalol (NORMODYNE) tablet 200 mg  200 mg Oral Once Sherren Kerns, MD        Review of Systems  Constitutional:  Constitutional negative. HENT: HENT  negative.  Eyes: Eyes negative.  Cardiovascular: Positive for leg swelling.  GI: Gastrointestinal negative.  Musculoskeletal: Musculoskeletal negative.  Skin: Positive for wound.  Neurological: Neurological negative. Hematologic: Hematologic/lymphatic negative.  Psychiatric: Psychiatric negative.       Objective:  Objective   Vitals:   09/24/20 0927  BP: (!) 174/117  Pulse: 81  Resp: 20  Temp: 98.2 F (36.8 C)  SpO2: 96%  Weight: 216 lb (98 kg)  Height: 5\' 10"  (1.778 m)   Body mass index is 30.99 kg/m.  Physical Exam HENT:     Head: Normocephalic.     Nose:     Comments: Wearing a mask Neck:     Vascular: No carotid bruit.  Cardiovascular:     Rate and Rhythm: Normal rate.     Pulses:          Popliteal pulses are 2+ on the right side and 2+ on the left side.       Dorsalis pedis pulses are 0 on the right side and 0 on the left side.       Posterior tibial pulses are 0 on the right side and 0 on the left side.  Abdominal:     General: Abdomen is flat.     Palpations: Abdomen is soft. There is no mass.  Musculoskeletal:     Right lower leg: Edema present.     Left lower leg: Edema present.  Skin:    Capillary Refill: Capillary refill takes 2 to 3 seconds.     Comments: Skin changes consistent with chronic venous insufficiency bilateral legs Right leg anterior 1 x 1 cm wound  Neurological:     General: No focal deficit present.     Mental Status: He is alert.  Psychiatric:        Mood and Affect: Mood normal.        Behavior: Behavior normal.        Thought Content: Thought content normal.        Judgment: Judgment normal.    Data: Current ABI: Rt ABI 0.87, Lt ABI [place]   Comparison Study: None   Performing Technologist:      Examination Guidelines: A complete evaluation includes B-mode imaging,  spectral  Doppler, color Doppler, and power Doppler as needed of all accessible  portions  of each vessel. Bilateral testing is considered an  integral part of a  complete  examination. Limited examinations for reoccurring indications may be  performed  as noted.        +-----------+--------+-----+---------------+----------+--------------------  -+  RIGHT      PSV cm/sRatioStenosis       Waveform  Comments                 +-----------+--------+-----+---------------+----------+--------------------  -+  CFA Prox   122                         triphasic                          +-----------+--------+-----+---------------+----------+--------------------  -+  DFA        220                         biphasic                           +-----------+--------+-----+---------------+----------+--------------------  -+  SFA Prox   171                         triphasic                          +-----------+--------+-----+---------------+----------+--------------------  -+  SFA Mid    155                         triphasic collateral  visualized  +-----------+--------+-----+---------------+----------+--------------------  -+  SFA Distal 180                         triphasic                          +-----------+--------+-----+---------------+----------+--------------------  -+  POP Mid    94                          triphasic                          +-----------+--------+-----+---------------+----------+--------------------  -+  ATA Distal 69                          monophasic                         +-----------+--------+-----+---------------+----------+--------------------  -+  PTA Mid    256          30-49% stenosis                                   +-----------+--------+-----+---------------+----------+--------------------  -+  PTA Distal 445          75-99% stenosismonophasic                         +-----------+--------+-----+---------------+----------+--------------------  -+  PERO Prox  14                                                              +-----------+--------+-----+---------------+----------+--------------------  -+  PERO  Distal12                          biphasic                           +-----------+--------+-----+---------------+----------+--------------------  -+       +-----------+--------+-----+---------------+---------+---------+  LEFT       PSV cm/sRatioStenosis       Waveform Comments   +-----------+--------+-----+---------------+---------+---------+  CFA Prox   133                         triphasic           +-----------+--------+-----+---------------+---------+---------+  DFA        110                         triphasic           +-----------+--------+-----+---------------+---------+---------+  SFA Prox   200                         triphasic           +-----------+--------+-----+---------------+---------+---------+  SFA Mid    140                         triphasic           +-----------+--------+-----+---------------+---------+---------+  SFA Distal 98                          triphasic           +-----------+--------+-----+---------------+---------+---------+  POP Mid    75                          triphasic           +-----------+--------+-----+---------------+---------+---------+  ATA Distal 50                          biphasic            +-----------+--------+-----+---------------+---------+---------+  PTA Distal 487          50-74% stenosistriphasicper ratio  +-----------+--------+-----+---------------+---------+---------+  PERO Distal38                          biphasic            +-----------+--------+-----+---------------+---------+---------+      Summary:  Right: 75-99% stenosis noted in the posterior tibial artery.   Left: 50-74% stenosis noted in the posterior tibial artery.      Assessment/Plan:     65 year old male appears to have mixed venous and arterial ulceration bilateral lower extremities.   I discussed that given he has a wound we will plan for angiography from a left common femoral approach.  We will also need to schedule venous reflux studies to follow-up but he has had saphenous vein harvest for coronary artery bypass grafting.  We will get this set up in the near future for aortogram to start.     Maeola Harman MD [facility]

## 2020-09-29 ENCOUNTER — Other Ambulatory Visit: Payer: Self-pay

## 2020-10-13 ENCOUNTER — Ambulatory Visit (HOSPITAL_COMMUNITY)
Admission: RE | Disposition: A | Discharge status: Home / Self Care | Payer: Self-pay | Source: Home / Self Care | Attending: Vascular Surgery

## 2020-10-13 ENCOUNTER — Encounter (HOSPITAL_COMMUNITY): Payer: Self-pay | Admitting: Vascular Surgery

## 2020-10-13 ENCOUNTER — Other Ambulatory Visit: Payer: Self-pay

## 2020-10-13 ENCOUNTER — Ambulatory Visit (HOSPITAL_COMMUNITY)
Admission: RE | Admit: 2020-10-13 | Discharge: 2020-10-13 | Disposition: A | Discharge status: Home / Self Care | Payer: 59 | Attending: Vascular Surgery | Admitting: Vascular Surgery

## 2020-10-13 DIAGNOSIS — E1151 Type 2 diabetes mellitus with diabetic peripheral angiopathy without gangrene: Secondary | ICD-10-CM | POA: Diagnosis not present

## 2020-10-13 DIAGNOSIS — Z951 Presence of aortocoronary bypass graft: Secondary | ICD-10-CM | POA: Insufficient documentation

## 2020-10-13 DIAGNOSIS — Z886 Allergy status to analgesic agent status: Secondary | ICD-10-CM | POA: Insufficient documentation

## 2020-10-13 DIAGNOSIS — I872 Venous insufficiency (chronic) (peripheral): Secondary | ICD-10-CM | POA: Diagnosis not present

## 2020-10-13 DIAGNOSIS — Z79899 Other long term (current) drug therapy: Secondary | ICD-10-CM | POA: Insufficient documentation

## 2020-10-13 DIAGNOSIS — Z95828 Presence of other vascular implants and grafts: Secondary | ICD-10-CM | POA: Diagnosis not present

## 2020-10-13 DIAGNOSIS — L97819 Non-pressure chronic ulcer of other part of right lower leg with unspecified severity: Secondary | ICD-10-CM | POA: Insufficient documentation

## 2020-10-13 DIAGNOSIS — Z7902 Long term (current) use of antithrombotics/antiplatelets: Secondary | ICD-10-CM | POA: Insufficient documentation

## 2020-10-13 DIAGNOSIS — Z7982 Long term (current) use of aspirin: Secondary | ICD-10-CM | POA: Diagnosis not present

## 2020-10-13 DIAGNOSIS — Z7984 Long term (current) use of oral hypoglycemic drugs: Secondary | ICD-10-CM | POA: Diagnosis not present

## 2020-10-13 DIAGNOSIS — I70238 Atherosclerosis of native arteries of right leg with ulceration of other part of lower right leg: Secondary | ICD-10-CM | POA: Insufficient documentation

## 2020-10-13 HISTORY — PX: ABDOMINAL AORTOGRAM W/LOWER EXTREMITY: CATH118223

## 2020-10-13 HISTORY — PX: PERIPHERAL VASCULAR BALLOON ANGIOPLASTY: CATH118281

## 2020-10-13 HISTORY — PX: PERIPHERAL VASCULAR ATHERECTOMY: CATH118256

## 2020-10-13 LAB — POCT I-STAT, CHEM 8
BUN: 20 mg/dL (ref 8–23)
Calcium, Ion: 1.23 mmol/L (ref 1.15–1.40)
Chloride: 103 mmol/L (ref 98–111)
Creatinine, Ser: 1.3 mg/dL — ABNORMAL HIGH (ref 0.61–1.24)
Glucose, Bld: 145 mg/dL — ABNORMAL HIGH (ref 70–99)
HCT: 43 % (ref 39.0–52.0)
Hemoglobin: 14.6 g/dL (ref 13.0–17.0)
Potassium: 4.1 mmol/L (ref 3.5–5.1)
Sodium: 141 mmol/L (ref 135–145)
TCO2: 27 mmol/L (ref 22–32)

## 2020-10-13 SURGERY — ABDOMINAL AORTOGRAM W/LOWER EXTREMITY
Anesthesia: LOCAL

## 2020-10-13 MED ORDER — HEPARIN (PORCINE) IN NACL 1000-0.9 UT/500ML-% IV SOLN
INTRAVENOUS | Status: DC | PRN
  Administered 2020-10-13 (×2): 500 mL

## 2020-10-13 MED ORDER — FENTANYL CITRATE (PF) 100 MCG/2ML IJ SOLN
INTRAMUSCULAR | Status: AC
  Filled 2020-10-13: qty 2

## 2020-10-13 MED ORDER — SODIUM CHLORIDE 0.9 % IV SOLN: 250.0000 mL | INTRAVENOUS | Status: DC | PRN

## 2020-10-13 MED ORDER — SODIUM CHLORIDE 0.9 % IV SOLN: INTRAVENOUS | Status: DC

## 2020-10-13 MED ORDER — LIDOCAINE HCL (PF) 1 % IJ SOLN
INTRAMUSCULAR | Status: DC | PRN
  Administered 2020-10-13: 15 mL via INTRADERMAL

## 2020-10-13 MED ORDER — MIDAZOLAM HCL 2 MG/2ML IJ SOLN
INTRAMUSCULAR | Status: AC
  Filled 2020-10-13: qty 2

## 2020-10-13 MED ORDER — HEPARIN SODIUM (PORCINE) 1000 UNIT/ML IJ SOLN
INTRAMUSCULAR | Status: DC | PRN
  Administered 2020-10-13: 10000 [IU] via INTRAVENOUS

## 2020-10-13 MED ORDER — SODIUM CHLORIDE 0.9% FLUSH: 3.0000 mL | INTRAVENOUS | Status: DC | PRN

## 2020-10-13 MED ORDER — MORPHINE SULFATE (PF) 2 MG/ML IV SOLN
2.0000 mg | INTRAVENOUS | Status: DC | PRN
Start: 2020-10-13 — End: 2020-10-13

## 2020-10-13 MED ORDER — LIDOCAINE HCL (PF) 1 % IJ SOLN
INTRAMUSCULAR | Status: AC
  Filled 2020-10-13: qty 30

## 2020-10-13 MED ORDER — ACETAMINOPHEN 325 MG PO TABS: 650.0000 mg | ORAL_TABLET | ORAL | Status: DC | PRN

## 2020-10-13 MED ORDER — NITROGLYCERIN IN D5W 200-5 MCG/ML-% IV SOLN
INTRAVENOUS | Status: AC
  Filled 2020-10-13: qty 250

## 2020-10-13 MED ORDER — MIDAZOLAM HCL 2 MG/2ML IJ SOLN
INTRAMUSCULAR | Status: DC | PRN
  Administered 2020-10-13: 1 mg via INTRAVENOUS

## 2020-10-13 MED ORDER — ONDANSETRON HCL 4 MG/2ML IJ SOLN: 4.0000 mg | Freq: Four times a day (QID) | INTRAMUSCULAR | Status: DC | PRN

## 2020-10-13 MED ORDER — VERAPAMIL HCL 2.5 MG/ML IV SOLN
INTRAVENOUS | Status: AC
  Filled 2020-10-13: qty 2

## 2020-10-13 MED ORDER — SODIUM CHLORIDE 0.9% FLUSH: 3.0000 mL | Freq: Two times a day (BID) | INTRAVENOUS | Status: DC

## 2020-10-13 MED ORDER — OXYCODONE HCL 5 MG PO TABS: 5.0000 mg | ORAL_TABLET | ORAL | Status: DC | PRN

## 2020-10-13 MED ORDER — LABETALOL HCL 5 MG/ML IV SOLN: 10.0000 mg | INTRAVENOUS | Status: DC | PRN

## 2020-10-13 MED ORDER — HEPARIN (PORCINE) IN NACL 1000-0.9 UT/500ML-% IV SOLN
INTRAVENOUS | Status: AC
  Filled 2020-10-13: qty 1000

## 2020-10-13 MED ORDER — FENTANYL CITRATE (PF) 100 MCG/2ML IJ SOLN
INTRAMUSCULAR | Status: DC | PRN
  Administered 2020-10-13 (×2): 50 ug via INTRAVENOUS

## 2020-10-13 MED ORDER — SODIUM CHLORIDE 0.9 % WEIGHT BASED INFUSION: 1.0000 mL/kg/h | INTRAVENOUS | Status: DC

## 2020-10-13 MED ORDER — HYDRALAZINE HCL 20 MG/ML IJ SOLN: 5.0000 mg | INTRAMUSCULAR | Status: DC | PRN

## 2020-10-13 MED ORDER — IODIXANOL 320 MG/ML IV SOLN
INTRAVENOUS | Status: DC | PRN
  Administered 2020-10-13: 100 mL via INTRA_ARTERIAL

## 2020-10-13 SURGICAL SUPPLY — 19 items
BALLN STERLING OTW 3X220X150 (BALLOONS) ×3
BALLOON STERLING OTW 3X220X150 (BALLOONS) IMPLANT
CATH ANGIO 5F PIGTAIL 65CM (CATHETERS) ×1 IMPLANT
CATH CXI SUPP 2.6F 150 ANG (CATHETERS) ×1 IMPLANT
CLOSURE MYNX CONTROL 6F/7F (Vascular Products) ×1 IMPLANT
DIAMONDBACK CLASSIC OAS 1.5MM (CATHETERS) ×3
KIT ENCORE 26 ADVANTAGE (KITS) ×1 IMPLANT
KIT PV (KITS) ×3 IMPLANT
SHEATH PINNACLE 5F 10CM (SHEATH) ×1 IMPLANT
SHEATH PINNACLE 6F 10CM (SHEATH) ×1 IMPLANT
SHEATH PINNACLE ST 6F 65CM (SHEATH) ×1 IMPLANT
SHEATH PROBE COVER 6X72 (BAG) ×1 IMPLANT
SYR MEDRAD MARK V 150ML (SYRINGE) ×1 IMPLANT
SYSTEM DIMNDBCK CLSC OAS 1.5MM (CATHETERS) IMPLANT
TRANSDUCER W/STOPCOCK (MISCELLANEOUS) ×3 IMPLANT
TRAY PV CATH (CUSTOM PROCEDURE TRAY) ×3 IMPLANT
WIRE BENTSON .035X145CM (WIRE) ×1 IMPLANT
WIRE G V18X300CM (WIRE) ×1 IMPLANT
WIRE VIPER WIRECTO 0.014 (WIRE) ×1 IMPLANT

## 2020-10-13 NOTE — Op Note (Signed)
    Patient name: [name] [name] MRN: [id number] DOB: 01/17/1955 Sex: male  10/13/2020 Pre-operative Diagnosis: Critical right lower extremity ischemia with leg ulceration, venous insufficiency Post-operative diagnosis:  Same Surgeon:  Luanna Salk. Randie Heinz, MD Procedure Performed: 1.  Ultrasound-guided cannulation left common femoral artery 2.  Aortogram 3.  Selection of right common femoral artery and right lower extremity angiography 4.  CSI atherectomy with 1.5 mm classic of right posterior tibial artery and tibioperoneal trunk 5.  Balloon angioplasty right posterior tibial artery and tibioperoneal trunk with 3 mm balloon 6.  Moderate sedation with fentanyl and Versed for 69 minutes 7.  Maximize closure left common femoral artery   Indications: 65 year old male with ulceration of his right leg.  He has evidence of tibial disease by bilateral lower extremity arterial duplex.  He is indicated for angiography with possible intervention.  Findings: The aorta is patent.  There is a right renal artery stent which does not appear to have flow-limiting stenosis.  Left renal artery appears patent.  Right lower extremity common femoral artery is patent SFA does have areas of disease and calcification but no flow-limiting stenosis.  Popliteal artery is also patent.  Anterior tibial artery peroneal artery are occluded.  Posterior tibial artery with high-grade proximal stenosis in the tibioperoneal trunk multiple areas of high-grade stenosis leading all the way down to the ankle.  Peroneal artery reconstitutes via posterior tibial artery at the level of the ankle.  After intervention of the left posterior tibial artery where previously there were multiple areas of subtotal occlusion is now patent and there is 1 area of 50% on the left flow-limiting stenosis.  Peroneal artery reconstitutes via posterior tibial artery.  Patient will follow-up in 4 weeks with venous reflux testing for wound check and also arterial  duplex of his right lower extremity.   Procedure:  The patient was identified in the holding area and taken to room 8.  The patient was then placed supine on the table and prepped and draped in the usual sterile fashion.  A time out was called.  Ultrasound was used to evaluate the left common femoral artery.  This was noted to be patent and compressible.  The areas anesthetized 1% lidocaine cannulated with an 18-gauge needle followed by a Bentson wire and a 5 French sheath.  We placed a pigtail catheter to the level of L1 performed aortogram.  We then crossed the bifurcation performed right lower extremity angiography.  With the above findings and placed a Bentson wire the SFA and then placed a long 6 French sheath patient was fully heparinized.  We use V 18 wire and CXI catheter to cross the subtotally occluded posterior tibial artery and then confirmed intraluminal access.  We performed CSI atherectomy with 1.5 mm classic.  We then serially performed balloon angioplasty with 3 mm balloon of the posterior tibial artery and tibioperoneal trunk.  Completion demonstrated much improved flow there was 1 area calcification distally elected not to be intervened upon.  Posterior tibial artery gives rise to peroneal artery at the level of the ankle and the foot does feel although somewhat sluggish is likely due to distal spasm from the wire.  I exchanged for short 6 French sheath over a Bentson wire and deployed a minx device which he tolerated well.  There were no immediate complications.  Contrast: 100 cc    Lorren Rossetti C. Randie Heinz, MD [facility] Office: [phone] Pager: [phone]

## 2020-10-13 NOTE — Interval H&P Note (Signed)
History and Physical Interval Note:  10/13/2020 7:17 AM  [name] [name]  has presented today for surgery, with the diagnosis of leg ulcer.  The various methods of treatment have been discussed with the patient and family. After consideration of risks, benefits and other options for treatment, the patient has consented to  Procedure(s): ABDOMINAL AORTOGRAM W/LOWER EXTREMITY (N/A) as a surgical intervention.  The patient's history has been reviewed, patient examined, no change in status, stable for surgery.  I have reviewed the patient's chart and labs.  Questions were answered to the patient's satisfaction.     [name]

## 2020-10-15 MED FILL — Verapamil HCl IV Soln 2.5 MG/ML: INTRAVENOUS | Qty: 2 | Status: AC

## 2020-10-15 MED FILL — Nitroglycerin IV Soln 200 MCG/ML in D5W: INTRAVENOUS | Qty: 250 | Status: AC

## 2020-11-08 ENCOUNTER — Other Ambulatory Visit: Payer: Self-pay

## 2020-11-08 DIAGNOSIS — I872 Venous insufficiency (chronic) (peripheral): Secondary | ICD-10-CM

## 2020-11-08 DIAGNOSIS — I739 Peripheral vascular disease, unspecified: Secondary | ICD-10-CM

## 2020-11-26 ENCOUNTER — Other Ambulatory Visit: Payer: Self-pay

## 2020-11-26 ENCOUNTER — Ambulatory Visit (INDEPENDENT_AMBULATORY_CARE_PROVIDER_SITE_OTHER)
Admission: RE | Admit: 2020-11-26 | Discharge: 2020-11-26 | Disposition: A | Discharge status: Home / Self Care | Payer: 59 | Source: Ambulatory Visit | Attending: Vascular Surgery | Admitting: Vascular Surgery

## 2020-11-26 ENCOUNTER — Ambulatory Visit (INDEPENDENT_AMBULATORY_CARE_PROVIDER_SITE_OTHER): Payer: 59 | Admitting: Physician Assistant

## 2020-11-26 ENCOUNTER — Ambulatory Visit (HOSPITAL_COMMUNITY)
Admission: RE | Admit: 2020-11-26 | Discharge: 2020-11-26 | Disposition: A | Discharge status: Home / Self Care | Payer: 59 | Source: Ambulatory Visit | Attending: Vascular Surgery | Admitting: Vascular Surgery

## 2020-11-26 VITALS — BP 148/86 | HR 66 | Temp 98.3°F | Resp 20 | Ht 71.0 in | Wt 213.8 lb

## 2020-11-26 DIAGNOSIS — I872 Venous insufficiency (chronic) (peripheral): Secondary | ICD-10-CM

## 2020-11-26 DIAGNOSIS — I739 Peripheral vascular disease, unspecified: Secondary | ICD-10-CM | POA: Insufficient documentation

## 2020-11-26 NOTE — Progress Notes (Signed)
Office Note     CC:  follow up Requesting Provider:  Eloisa Northern, MD  HPI: [name] [name] is a 65 y.o. (1955/02/04) male who presents for 1 month follow-up status post arteriogram for critical right lower extremity ischemia with leg ulceration.  He underwent atherectomy of the right posterior tibial artery and tibioperoneal trunk, balloon angioplasty of the right posterior tibial artery and tibioperoneal trunk by Dr. Randie Heinz on October 13, 2020.  His ulceration was felt to be likely secondary to venous insufficiency.  History of left saphenous vein harvest for coronary artery bypass grafting.   He has chronic lower extremity edema primarily confined to the ankle.  He uses compression stockings.  He also states he elevates his legs.  He has no prior history of deep venous thrombosis or vein procedures. He has been treated for lower extremity cellulitis and superficial anterior lower leg skin wounds by his primary care provider.  He currently denies claudication or rest pain.  He is active around his home and does monitor home and deck repairs.    He is compliant with daily aspirin, statin and Plavix. He is diabetic. He has no history of tobacco use.   Past Medical History:  Diagnosis Date   Cellulitis    Chronic kidney disease    CVA (cerebral vascular accident) (HCC) 2014   Diabetes mellitus without complication (HCC)    Gout    Hypertension    Obesity (BMI 30.0-34.9)    Sleep disorder     Past Surgical History:  Procedure Laterality Date   ABDOMINAL AORTOGRAM W/LOWER EXTREMITY N/A 10/13/2020   Procedure: ABDOMINAL AORTOGRAM W/LOWER EXTREMITY;  Surgeon: Maeola Harman, MD;  Location: [facility] INVASIVE CV LAB;  Service: Cardiovascular;  Laterality: N/A;   Arthroscopic knee surgery     PERIPHERAL VASCULAR ATHERECTOMY  10/13/2020   Procedure: PERIPHERAL VASCULAR ATHERECTOMY;  Surgeon: Maeola Harman, MD;  Location: [facility] INVASIVE CV LAB;  Service: Cardiovascular;;  posterior  tibial artery   PERIPHERAL VASCULAR BALLOON ANGIOPLASTY  10/13/2020   Procedure: PERIPHERAL VASCULAR BALLOON ANGIOPLASTY;  Surgeon: Maeola Harman, MD;  Location: [facility] INVASIVE CV LAB;  Service: Cardiovascular;;  posterior tibial artery   PERIPHERAL VASCULAR INTERVENTION Right 03/11/2017   Procedure: PERIPHERAL VASCULAR INTERVENTION;  Surgeon: Sherren Kerns, MD;  Location: [facility] INVASIVE CV LAB;  Service: Cardiovascular;  Laterality: Right;  right renal artery stent   RENAL ANGIOGRAPHY N/A 03/11/2017   Procedure: RENAL ANGIOGRAPHY;  Surgeon: Sherren Kerns, MD;  Location: [facility] INVASIVE CV LAB;  Service: Cardiovascular;  Laterality: N/A;   TONSILLECTOMY      Social History   Socioeconomic History   Marital status: Married    Spouse name: Not on file   Number of children: Not on file   Years of education: Not on file   Highest education level: Not on file  Occupational History   Not on file  Tobacco Use   Smoking status: Never   Smokeless tobacco: Never  Vaping Use   Vaping Use: Never used  Substance and Sexual Activity   Alcohol use: Yes   Drug use: No   Sexual activity: Not on file  Other Topics Concern   Not on file  Social History Narrative   Pt ambulates in the hallway. He socialize with staff    Social Determinants of Health   Financial Resource Strain: Not on file  Food Insecurity: Not on file  Transportation Needs: Not on file  Physical Activity: Not on file  Stress:  Not on file  Social Connections: Not on file  Intimate Partner Violence: Not on file   Family History  Problem Relation Age of Onset   Hypertension Mother    Hypertension Father    Colon cancer Paternal Grandmother     Current Outpatient Medications  Medication Sig Dispense Refill   allopurinol (ZYLOPRIM) 300 MG tablet Take 1 tablet (300 mg total) by mouth daily. 30 tablet 0   amLODipine (NORVASC) 10 MG tablet Take 1 tablet (10 mg total) by mouth daily. 30 tablet 0   aspirin EC 81 MG  tablet Take 81 mg daily by mouth.     atorvastatin (LIPITOR) 80 MG tablet Take 80 mg by mouth daily.     cholecalciferol (VITAMIN D3) 10 MCG (400 UNIT) TABS tablet Take 5,000 Units by mouth daily.     cloNIDine (CATAPRES) 0.2 MG tablet Take 0.2 mg by mouth daily.     clopidogrel (PLAVIX) 75 MG tablet Take 1 tablet (75 mg total) by mouth daily. 30 tablet 11   colchicine 0.6 MG tablet Take 0.6 mg by mouth daily as needed (gout).     furosemide (LASIX) 40 MG tablet Take 40 mg by mouth daily.     metFORMIN (GLUCOPHAGE) 1000 MG tablet Take 1,000 mg by mouth daily.     metoprolol succinate (TOPROL-XL) 25 MG 24 hr tablet Take 75 mg by mouth daily.     nitroGLYCERIN (NITROSTAT) 0.4 MG SL tablet Place 0.4 mg under the tongue every 5 (five) minutes as needed for chest pain.      oxymetazoline (AFRIN) 0.05 % nasal spray Place 1 spray into both nostrils 2 (two) times daily as needed for congestion.     pregabalin (LYRICA) 75 MG capsule Take 75 mg by mouth daily.     Current Facility-Administered Medications  Medication Dose Route Frequency Provider Last Rate Last Admin   labetalol (NORMODYNE) tablet 200 mg  200 mg Oral Once Sherren Kerns, MD        Allergies  Allergen Reactions   Ibuprofen     Other reaction(s): Other (See Comments) Contraindicated by his CKD     REVIEW OF SYSTEMS:   [X]  denotes positive finding, [ ]  denotes negative finding Cardiac  Comments:  Chest pain or chest pressure:    Shortness of breath upon exertion:    Short of breath when lying flat:    Irregular heart rhythm:        Vascular    Pain in calf, thigh, or hip brought on by ambulation:    Pain in feet at night that wakes you up from your sleep:     Blood clot in your veins:    Leg swelling:  x       Pulmonary    Oxygen at home:    Productive cough:     Wheezing:         Neurologic    Sudden weakness in arms or legs:     Sudden numbness in arms or legs:     Sudden onset of difficulty speaking or  slurred speech:    Temporary loss of vision in one eye:     Problems with dizziness:         Gastrointestinal    Blood in stool:     Vomited blood:         Genitourinary    Burning when urinating:     Blood in urine:        Psychiatric  Major depression:         Hematologic    Bleeding problems:    Problems with blood clotting too easily:        Skin    Rashes or ulcers: x       Constitutional    Fever or chills:      PHYSICAL EXAMINATION:  There were no vitals filed for this visit.  General:  WDWN in NAD; vital signs documented above Gait: Not observed HENT: WNL, normocephalic Pulmonary: normal non-labored breathing  Cardiac: regular HR Skin: with rashes Vascular Exam/Pulses: Pedal pulses are not palpable.  2+ left posterior tibial pulse Extremities: without ischemic changes, without Gangrene , without cellulitis; without open wounds; scales pf bilateral lower legs without full thickness skin loss Musculoskeletal: no muscle wasting or atrophy  Neurologic: A&O X 3;  No focal weakness or paresthesias are detected Psychiatric:  The pt has Normal affect.     Non-Invasive Vascular Imaging:   11/26/2020 ABIs ABI/TBIToday's ABIToday's TBIPrevious ABIPrevious TBI  +-------+-----------+-----------+------------+------------+  Right  1.06       0.68       0.87        0.54          +-------+-----------+-----------+------------+------------+  Left   1.09       0.70       [place]          0.71          +-------+-----------+-----------+------------+------------+   Right great toe pressure is 108.  Waveforms are triphasic and biphasic Left great toe pressure is 111.  Waveforms are triphasic and biphasic    Right ABIs and TBIs appear increased.     Summary:  Right: Resting right ankle-brachial index is within normal range. No  evidence of significant right lower extremity arterial disease. The right  toe-brachial index is abnormal.   Left: Resting left  ankle-brachial index is within normal range. No  evidence of significant left lower extremity arterial disease. The left  toe-brachial index is normal.    *See table(s) above for measurements and observations.   Preliminary    Right lower extremity arterial duplex Summary:  Right: Patent right lower extremity with probable tibial artery occlusive  disease.  Waveforms are triphasic with the exception of the distal anterior tibial artery and distal peroneal artery where the waveforms are monophasic.    See table(s) above for measurements and observations.   Preliminary    Lower extremity venous reflux study: Summary:  Right:  - No evidence of deep vein thrombosis seen in the right lower extremity,  from the common femoral through the popliteal veins.  - Venous reflux is noted in the right common femoral vein.  - Venous reflux is noted in the right greater saphenous vein in the thigh.  - Venous reflux is noted in the right short saphenous vein.  - Evidence of superficial thrombophlebitis in the short saphenous vein     *See table(s) above for measurements and observations.       Preliminary   ASSESSMENT/PLAN:: 65 y.o. male here for follow up for right lower extremity ischemia s/p intervention to right tibial arteries. Improvement in ABIs.  Continue aspirin, Plavix, statin and physical exercise.  Follow-up in 6 months with ABIs and right lower extremity arterial duplex study.  C3 venous disease of bilateral lower extremities.  Continue compression, local wound/skin care per primary care provider's recommendation. Recommend healthy vein measures to include compression stockings, proper elevation, weight control and exercise.  The patient  is given written information in regards to this.  Milinda Antis, PA-C [facility] [phone]  Clinic MD:   Dr. Eugenia Mcalpine. Karin Lieu on call

## 2020-12-01 ENCOUNTER — Other Ambulatory Visit: Payer: Self-pay

## 2020-12-01 DIAGNOSIS — I739 Peripheral vascular disease, unspecified: Secondary | ICD-10-CM

## 2021-01-07 ENCOUNTER — Ambulatory Visit (INDEPENDENT_AMBULATORY_CARE_PROVIDER_SITE_OTHER): Payer: 59 | Admitting: Orthopaedic Surgery

## 2021-01-07 ENCOUNTER — Other Ambulatory Visit: Payer: Self-pay

## 2021-01-07 ENCOUNTER — Ambulatory Visit (INDEPENDENT_AMBULATORY_CARE_PROVIDER_SITE_OTHER): Payer: 59

## 2021-01-07 VITALS — Ht 71.0 in | Wt 215.0 lb

## 2021-01-07 DIAGNOSIS — M25562 Pain in left knee: Secondary | ICD-10-CM | POA: Diagnosis not present

## 2021-01-07 DIAGNOSIS — G8929 Other chronic pain: Secondary | ICD-10-CM

## 2021-01-07 MED ORDER — LIDOCAINE HCL 1 % IJ SOLN
3.0000 mL | INTRAMUSCULAR | Status: AC | PRN
  Administered 2021-01-07: 3 mL

## 2021-01-07 MED ORDER — METHYLPREDNISOLONE ACETATE 40 MG/ML IJ SUSP
40.0000 mg | INTRAMUSCULAR | Status: AC | PRN
  Administered 2021-01-07: 40 mg via INTRA_ARTICULAR

## 2021-01-07 NOTE — Progress Notes (Signed)
Office Visit Note   Patient: [name] [name]           Date of Birth: 1955/01/23           MRN: [id number] Visit Date: 01/07/2021              Requested by: Garwin Brothers, MD [place],   [id number] PCP: Garwin Brothers, MD   Assessment & Plan: Visit Diagnoses:  1. Chronic pain of left knee     Plan: I talked to him about conservative treatment for his left knee.  I recommended a steroid injection today and he agreed to this and tolerated it well.  He will watch his blood glucose levels due to the fact that steroid injections can elevate these.  I would like to send him to outpatient physical therapy for any modalities that can help strengthen his left knee and get it bending and better and hopefully decrease his pain.  I also recommended him try cane in his opposite right hand to offload his left knee.  We will see him back in 6 weeks to see how he is doing overall.  All questions and concerns were answered and addressed.  Follow-Up Instructions: Return in about 6 weeks (around 02/18/2021).   Orders:  Orders Placed This Encounter  Procedures   Large Joint Inj   XR Knee 1-2 Views Left   No orders of the defined types were placed in this encounter.     Procedures: Large Joint Inj: L knee on 01/07/2021 2:32 PM Indications: diagnostic evaluation and pain Details: 22 G 1.5 in needle, superolateral approach  Arthrogram: No  Medications: 3 mL lidocaine 1 %; 40 mg methylPREDNISolone acetate 40 MG/ML Outcome: tolerated well, no immediate complications Procedure, treatment alternatives, risks and benefits explained, specific risks discussed. Consent was given by the patient. Immediately prior to procedure a time out was called to verify the correct patient, procedure, equipment, support staff and site/side marked as required. Patient was prepped and draped in the usual sterile fashion.      Clinical Data: No additional findings.   Subjective: Chief Complaint  Patient  presents with   Left Knee - Pain  The patient is coming in for evaluation of left knee pain has been hurting for several months now.  He said he was told this may be arthritis in the knee.  There is only been some joint swelling but the pain does wake him at night.  He denies any specific injury.  He is 66 years old.  He points to the lateral aspect of his knee but also globally with his knee as source of his pain.  He has never had steroid injections in the knee or surgery on the knee.  He is on Plavix and is a diabetic and reports good blood glucose control.  He says it does wake him up at night and he is very stiff and has been sitting for a long period of time and gets up to walk.  He also has a history of gout.  HPI  Review of Systems There is currently listed no headache, chest pain, shortness of breath, fever, chills, nausea, vomiting  Objective: Vital Signs: Ht 5\' 11"  (1.803 m)    Wt 215 lb (97.5 kg)    BMI 29.99 kg/m   Physical Exam He is alert and orient x3 and in no acute distress Ortho Exam Examination of his left knee shows no effusion.  There is a  lot of tenderness over the IT band but also grossly around the knee itself.  The knee is ligamentously stable with good range of motion.  He is very stiff when he gets up.  He does have chronic cellulitis like changes in the distal tibial that he said is always been there since heart bypass surgery. Specialty Comments:  No specialty comments available.  Imaging: XR Knee 1-2 Views Left  Result Date: 01/07/2021 An AP and lateral left knee showed no acute findings.  The joint space medial and lateral are well-maintained.  There is patellofemoral arthritic changes.  There is no effusion.    PMFS History: Patient Active Problem List   Diagnosis Date Noted   Peripheral edema 07/09/2020   Thoracic ascending aortic aneurysm 02/24/2020   Abnormal finding on chest xray 02/01/2020   Intractable hiccups 01/29/2020   Idiopathic chronic gout  of ankle without tophus 10/24/2019   Cellulitis of left anterior lower leg 09/12/2019   Pleural effusion 07/04/2019   CAD in native artery 05/18/2019   S/P CABG (coronary artery bypass graft) 04/30/2019   NSTEMI (non-ST elevated myocardial infarction) ([place]) 04/27/2019   Cellulitis of left forearm 09/27/2018   Dyslipidemia 09/27/2018   Fall against sharp object 09/27/2018   History of CVA (cerebrovascular accident) 09/27/2018   Injury by nail 09/27/2018   Injury, forearm and wrist, left, initial encounter 09/27/2018   Prominent metatarsal head of right foot 05/16/2018   Capsulitis of metatarsophalangeal (MTP) joint of right foot 04/04/2018   Plantar fat pad atrophy of right foot 04/04/2018   PAD (peripheral artery disease) ([place]) 03/11/2017   Gait disturbance, post-stroke 12/10/2016   Anomic dysphasia 12/10/2016   Anxiety state    Hypertensive crisis    Acute idiopathic gout of right knee    Stroke due to embolism of left middle cerebral artery ([place]) 11/12/2016   Shortness of breath    SOB (shortness of breath)    Left hemiparesis (HCC)    Acute pain of right knee    Benign essential HTN    Stage 3 chronic kidney disease ([place])    Newly diagnosed diabetes ([place])    Chest pain    Atypical chest pain 10/14/2016   Abnormal EKG 09/10/2016   Sleep disorder 09/10/2016   Benign essential hypertension 09/10/2016   Elevated cholesterol 09/10/2016   Intracranial atherosclerosis 04/29/2016   OSA (obstructive sleep apnea) 04/29/2016   Alcohol use 04/28/2016   HTN (hypertension), malignant 04/28/2016   Obesity (BMI 30.0-34.9) 04/28/2016   Stress reaction 04/28/2016   New onset type 2 diabetes mellitus ([place]) 04/27/2016   AKI (acute kidney injury) ([place]) 04/26/2016   Ataxia 04/26/2016   Confusion 04/26/2016   Gout 04/26/2016   Past Medical History:  Diagnosis Date   Cellulitis    Chronic kidney disease    CVA (cerebral vascular accident) ([place]) 2014   Diabetes mellitus without  complication ([place])    Gout    Hypertension    Obesity (BMI 30.0-34.9)    Sleep disorder     Family History  Problem Relation Age of Onset   Hypertension Mother    Hypertension Father    Colon cancer Paternal Grandmother     Past Surgical History:  Procedure Laterality Date   ABDOMINAL AORTOGRAM W/LOWER EXTREMITY N/A 10/13/2020   Procedure: ABDOMINAL AORTOGRAM W/LOWER EXTREMITY;  Surgeon: Waynetta Sandy, MD;  Location: [facility] CV LAB;  Service: Cardiovascular;  Laterality: N/A;   Arthroscopic knee surgery     PERIPHERAL VASCULAR ATHERECTOMY  10/13/2020   Procedure: PERIPHERAL VASCULAR ATHERECTOMY;  Surgeon: Waynetta Sandy, MD;  Location: [facility] CV LAB;  Service: Cardiovascular;;  posterior tibial artery   PERIPHERAL VASCULAR BALLOON ANGIOPLASTY  10/13/2020   Procedure: PERIPHERAL VASCULAR BALLOON ANGIOPLASTY;  Surgeon: Waynetta Sandy, MD;  Location: [facility] CV LAB;  Service: Cardiovascular;;  posterior tibial artery   PERIPHERAL VASCULAR INTERVENTION Right 03/11/2017   Procedure: PERIPHERAL VASCULAR INTERVENTION;  Surgeon: Elam Dutch, MD;  Location: [facility] CV LAB;  Service: Cardiovascular;  Laterality: Right;  right renal artery stent   RENAL ANGIOGRAPHY N/A 03/11/2017   Procedure: RENAL ANGIOGRAPHY;  Surgeon: Elam Dutch, MD;  Location: [facility] CV LAB;  Service: Cardiovascular;  Laterality: N/A;   TONSILLECTOMY     Social History   Occupational History   Not on file  Tobacco Use   Smoking status: Never   Smokeless tobacco: Never  Vaping Use   Vaping Use: Never used  Substance and Sexual Activity   Alcohol use: Yes   Drug use: No   Sexual activity: Not on file

## 2021-02-09 ENCOUNTER — Encounter: Payer: Self-pay | Admitting: Orthopaedic Surgery

## 2021-02-09 ENCOUNTER — Other Ambulatory Visit: Payer: Self-pay

## 2021-02-09 ENCOUNTER — Ambulatory Visit (INDEPENDENT_AMBULATORY_CARE_PROVIDER_SITE_OTHER): Payer: 59 | Admitting: Orthopaedic Surgery

## 2021-02-09 DIAGNOSIS — S72425D Nondisplaced fracture of lateral condyle of left femur, subsequent encounter for closed fracture with routine healing: Secondary | ICD-10-CM

## 2021-02-09 DIAGNOSIS — G8929 Other chronic pain: Secondary | ICD-10-CM | POA: Diagnosis not present

## 2021-02-09 DIAGNOSIS — M25562 Pain in left knee: Secondary | ICD-10-CM | POA: Diagnosis not present

## 2021-02-09 NOTE — Progress Notes (Signed)
The patient is a 66 year old gentleman with left knee pain who comes in to go over MRI of his left knee that was ordered by his primary care physician appropriately.  He started having significant knee pain in December and I saw him in early January.  The x-rays of his knee did not show anything worrisome so we did place a sterile injection in the knee but he continued to have pain.  The MRI was obtained of his knee.  He just already using a cane in his opposite hand last week.  This is his left knee that is bothering him.  The pain is laterally based.  He still has pain over the lateral femoral condyle the knee.  There is a mild effusion but good range of motion of his knee.  I did go over the MRI and looked at the the studies of his left knee.  There is a large amount of edema in the lateral femoral condyle suggesting a insufficiency stress fracture.  There is no evidence of meniscal tearing.  There is thinning of the articular cartilage but no significant cartilage defect at all.  I shared with him the results of the MRI and showed them a knee model to explain what this means.  This is something where he really needs to offload his knee and stay off of it for the next few weeks and that should heal with time.  1 possibility would be a subchondroplasty arthroscopically but I think we should hold off on this to see if we can just get him to heal on its own.  I would like to see him back in 2 weeks and I would like a repeat AP and lateral of the left knee.

## 2021-02-18 ENCOUNTER — Ambulatory Visit: Payer: 59 | Admitting: Orthopaedic Surgery

## 2021-02-23 ENCOUNTER — Ambulatory Visit: Payer: 59 | Admitting: Orthopaedic Surgery

## 2021-04-23 DIAGNOSIS — Z01818 Encounter for other preprocedural examination: Secondary | ICD-10-CM

## 2021-05-27 NOTE — Progress Notes (Signed)
[facility] [place] HISTORY AND PHYSICAL   History of Present Illness:  Patient is a 66 y.o. year old male who presents for evaluation of PAD with history of right LE ulceration.  He underwent angiogram 10/13/20  with Balloon angioplasty of the PT, atherectomy with 1.5 mm classic of right posterior tibial artery and tibioperoneal trunk.  Anterior tibial artery peroneal artery are occluded.     He has chronic lower extremity edema primarily confined to the ankle.  He has a superficial venous type ulcer over the right anterior shin.  It is being managed by his PCP and he states it is slowly getting better.  He uses compression stockings.  His ambulation is limited by Left knee OA and he is planning TKA with DR. Lucy in the near future.  He is medically managed with daily aspirin, statin and Plavix.  Past Medical History:  Diagnosis Date   Cellulitis    Chronic kidney disease    CVA (cerebral vascular accident) (HCC) 2014   Diabetes mellitus without complication (HCC)    Gout    Hypertension    Obesity (BMI 30.0-34.9)    Sleep disorder     Past Surgical History:  Procedure Laterality Date   ABDOMINAL AORTOGRAM W/LOWER EXTREMITY N/A 10/13/2020   Procedure: ABDOMINAL AORTOGRAM W/LOWER EXTREMITY;  Surgeon: Maeola Harman, MD;  Location: [facility] INVASIVE CV LAB;  Service: Cardiovascular;  Laterality: N/A;   Arthroscopic knee surgery     PERIPHERAL VASCULAR ATHERECTOMY  10/13/2020   Procedure: PERIPHERAL VASCULAR ATHERECTOMY;  Surgeon: Maeola Harman, MD;  Location: [facility] INVASIVE CV LAB;  Service: Cardiovascular;;  posterior tibial artery   PERIPHERAL VASCULAR BALLOON ANGIOPLASTY  10/13/2020   Procedure: PERIPHERAL VASCULAR BALLOON ANGIOPLASTY;  Surgeon: Maeola Harman, MD;  Location: [facility] INVASIVE CV LAB;  Service: Cardiovascular;;  posterior tibial artery   PERIPHERAL VASCULAR INTERVENTION Right 03/11/2017   Procedure: PERIPHERAL VASCULAR  INTERVENTION;  Surgeon: Sherren Kerns, MD;  Location: [facility] INVASIVE CV LAB;  Service: Cardiovascular;  Laterality: Right;  right renal artery stent   RENAL ANGIOGRAPHY N/A 03/11/2017   Procedure: RENAL ANGIOGRAPHY;  Surgeon: Sherren Kerns, MD;  Location: [facility] INVASIVE CV LAB;  Service: Cardiovascular;  Laterality: N/A;   TONSILLECTOMY      ROS:   General:  No weight loss, Fever, chills  HEENT: No recent headaches, no nasal bleeding, no visual changes, no sore throat  Neurologic: No dizziness, blackouts, seizures. No recent symptoms of stroke or mini- stroke. No recent episodes of slurred speech, or temporary blindness.  Cardiac: No recent episodes of chest pain/pressure, no shortness of breath at rest.  No shortness of breath with exertion.  Denies history of atrial fibrillation or irregular heartbeat  Vascular: No history of rest pain in feet.  No history of claudication.  Positive history of non-healing ulcer, No history of DVT   Pulmonary: No home oxygen, no productive cough, no hemoptysis,  No asthma or wheezing  Musculoskeletal:  [ ]  Arthritis, [ ]  Low back pain,  [x ] Joint pain  Hematologic:No history of hypercoagulable state.  No history of easy bleeding.  No history of anemia  Gastrointestinal: No hematochezia or melena,  No gastroesophageal reflux, no trouble swallowing  Urinary: [ ]  chronic Kidney disease, [ ]  on HD - [ ]  MWF or [ ]  TTHS, [ ]  Burning with urination, [ ]  Frequent urination, [ ]  Difficulty urinating;   Skin: No rashes  Psychological: No history of anxiety,  No  history of depression  Social History Social History   Tobacco Use   Smoking status: Never   Smokeless tobacco: Never  Vaping Use   Vaping Use: Never used  Substance Use Topics   Alcohol use: Yes   Drug use: No    Family History Family History  Problem Relation Age of Onset   Hypertension Mother    Hypertension Father    Colon cancer Paternal Grandmother     Allergies  Allergies   Allergen Reactions   Ibuprofen     Other reaction(s): Other (See Comments) Contraindicated by his CKD     Current Outpatient Medications  Medication Sig Dispense Refill   allopurinol (ZYLOPRIM) 300 MG tablet Take 1 tablet (300 mg total) by mouth daily. 30 tablet 0   amLODipine (NORVASC) 10 MG tablet Take 1 tablet (10 mg total) by mouth daily. 30 tablet 0   colchicine 0.6 MG tablet Take 0.6 mg by mouth daily as needed (gout).     JARDIANCE 25 MG TABS tablet Take 25 mg by mouth every morning.     nitroGLYCERIN (NITROSTAT) 0.4 MG SL tablet Place 0.4 mg under the tongue every 5 (five) minutes as needed for chest pain.      oxymetazoline (AFRIN) 0.05 % nasal spray Place 1 spray into both nostrils 2 (two) times daily as needed for congestion.     TRULICITY 1.5 MG/0.5ML SOPN SMARTSIG:1 Unspecified SUB-Q Once a Week     aspirin EC 81 MG tablet Take 81 mg daily by mouth. (Patient not taking: Reported on 11/26/2020)     atorvastatin (LIPITOR) 80 MG tablet Take 80 mg by mouth daily. (Patient not taking: Reported on 05/29/2021)     cholecalciferol (VITAMIN D3) 10 MCG (400 UNIT) TABS tablet Take 5,000 Units by mouth daily. (Patient not taking: Reported on 05/29/2021)     cloNIDine (CATAPRES) 0.2 MG tablet Take 0.2 mg by mouth daily. (Patient not taking: Reported on 05/29/2021)     clopidogrel (PLAVIX) 75 MG tablet Take 1 tablet (75 mg total) by mouth daily. (Patient not taking: Reported on 05/29/2021) 30 tablet 11   furosemide (LASIX) 40 MG tablet Take 40 mg by mouth daily. (Patient not taking: Reported on 05/29/2021)     lisinopril (ZESTRIL) 10 MG tablet Take 10 mg by mouth daily. (Patient not taking: Reported on 05/29/2021)     metFORMIN (GLUCOPHAGE) 1000 MG tablet Take 1,000 mg by mouth daily. (Patient not taking: Reported on 05/29/2021)     Metoprolol Tartrate 75 MG TABS Take 1 tablet by mouth daily. (Patient not taking: Reported on 05/29/2021)     pregabalin (LYRICA) 75 MG capsule Take 75 mg by mouth  daily. (Patient not taking: Reported on 05/29/2021)     Current Facility-Administered Medications  Medication Dose Route Frequency Provider Last Rate Last Admin   labetalol (NORMODYNE) tablet 200 mg  200 mg Oral Once [name], [name], MD        Physical Examination  Vitals:   05/29/21 1014  BP: (!) 151/97  Pulse: (!) 102  Resp: 16  Temp: 98.2 F (36.8 C)  TempSrc: Temporal  SpO2: 96%  Weight: 220 lb (99.8 kg)  Height: 5\' 11"  (1.803 m)    Body mass index is 30.68 kg/m.  General:  Alert and oriented, no acute distress HEENT: Normal Neck: No bruit or JVD Pulmonary: Clear to auscultation bilaterally Cardiac: Regular Rate and Rhythm without murmur Abdomen: Soft, non-tender, non-distended, no mass, no scars Skin: No rash    Musculoskeletal:  No deformity or edema  Neurologic: Upper and lower extremity motor 5/5 and symmetric  DATA:   +-----------+--------+-----+---------------+----------+--------------------  ----+  RIGHT      PSV cm/sRatioStenosis       Waveform  Comments                    +-----------+--------+-----+---------------+----------+--------------------  ----+  CFA Distal 119                         triphasic                             +-----------+--------+-----+---------------+----------+--------------------  ----+  DFA        286          50-74% stenosistriphasic                             +-----------+--------+-----+---------------+----------+--------------------  ----+  SFA Prox   93                          triphasic                             +-----------+--------+-----+---------------+----------+--------------------  ----+  SFA Mid    143                         triphasic                             +-----------+--------+-----+---------------+----------+--------------------  ----+  SFA Distal 70                          triphasic                              +-----------+--------+-----+---------------+----------+--------------------  ----+  POP Mid    71                          triphasic                             +-----------+--------+-----+---------------+----------+--------------------  ----+  ATA Distal 38                          monophasic                            +-----------+--------+-----+---------------+----------+--------------------  ----+  PTA Distal 196          50-74% stenosismonophasic                            +-----------+--------+-----+---------------+----------+--------------------  ----+  PERO Distal24                          monophasicappears occluded  with  reconstitution of  flow                                                     via collateral              +-----------+--------+-----+---------------+----------+--------------------  ----+ Summary:  Right: Patent right arterial system with elevated velocities noted in the  deep femoral artery and posterior tibial artery suggesting 50-74%  stenosis. Diffuse tibial disease. Peroneal appears occluded with  reconstitution of flow via collaterals.      ABI Findings:  +---------+------------------+-----+--------+--------+  Right    Rt Pressure (mmHg)IndexWaveformComment   +---------+------------------+-----+--------+--------+  Brachial 157                                      +---------+------------------+-----+--------+--------+  PTA      113               0.72 biphasic          +---------+------------------+-----+--------+--------+  DP       94                0.60 biphasic          +---------+------------------+-----+--------+--------+  Great Toe79                0.50                   +---------+------------------+-----+--------+--------+   +---------+------------------+-----+---------+-------+  Left     Lt Pressure  (mmHg)IndexWaveform Comment  +---------+------------------+-----+---------+-------+  Brachial 156                                      +---------+------------------+-----+---------+-------+  PTA      135               0.86 triphasic         +---------+------------------+-----+---------+-------+  DP       105               0.67 biphasic          +---------+------------------+-----+---------+-------+  Great Toe80                0.51                   +---------+------------------+-----+---------+-------+   +-------+-----------+-----------+------------+------------+  ABI/TBIToday's ABIToday's TBIPrevious ABIPrevious TBI  +-------+-----------+-----------+------------+------------+  Right  0.72       0.50       1.06        0.68          +-------+-----------+-----------+------------+------------+  Left   0.86       0.51       1.09        0.70          +-------+-----------+-----------+------------+------------+      Bilateral ABIs and TBIs appear decreased.     Summary:  Right: Resting right ankle-brachial index indicates moderate right lower  extremity arterial disease. The right toe-brachial index is abnormal.   Left: Resting left ankle-brachial index indicates mild left lower  extremity arterial disease. The left toe-brachial index is abnormal.    ASSESSMENT/PLAN:  66 y.o. male here for follow up for right lower  extremity ischemia s/p intervention to right tibial arteries. Improvement in ABIs.  He has narrowing in the DFA and posterior tibial artery suggesting 50-74% stenosis. Diffuse tibial disease. Peroneal appears occluded with reconstitution of flow via collaterals.  He denies new symptoms of non healing wounds on his feet and toes.  He denies rest pain and claudication at short distances.      Continue aspirin, Plavix, statin and physical exercise.  Follow-up in 6 months with ABIs and right lower extremity arterial duplex  study.      Mosetta Pigeon PA-C [facility] [phone]  MD in clinic [place]

## 2021-05-29 ENCOUNTER — Ambulatory Visit (HOSPITAL_COMMUNITY)
Admission: RE | Admit: 2021-05-29 | Discharge: 2021-05-29 | Disposition: A | Discharge status: Home / Self Care | Payer: 59 | Source: Ambulatory Visit | Attending: Vascular Surgery | Admitting: Vascular Surgery

## 2021-05-29 ENCOUNTER — Encounter: Payer: Self-pay | Admitting: Physician Assistant

## 2021-05-29 ENCOUNTER — Ambulatory Visit (INDEPENDENT_AMBULATORY_CARE_PROVIDER_SITE_OTHER)
Admission: RE | Admit: 2021-05-29 | Discharge: 2021-05-29 | Disposition: A | Discharge status: Home / Self Care | Payer: 59 | Source: Ambulatory Visit | Attending: Vascular Surgery | Admitting: Vascular Surgery

## 2021-05-29 ENCOUNTER — Ambulatory Visit (INDEPENDENT_AMBULATORY_CARE_PROVIDER_SITE_OTHER): Payer: 59 | Admitting: Physician Assistant

## 2021-05-29 VITALS — BP 151/97 | HR 102 | Temp 98.2°F | Resp 16 | Ht 71.0 in | Wt 220.0 lb

## 2021-05-29 DIAGNOSIS — I739 Peripheral vascular disease, unspecified: Secondary | ICD-10-CM

## 2021-06-05 ENCOUNTER — Other Ambulatory Visit: Payer: Self-pay | Admitting: *Deleted

## 2021-06-05 DIAGNOSIS — I739 Peripheral vascular disease, unspecified: Secondary | ICD-10-CM

## 2021-09-29 ENCOUNTER — Ambulatory Visit: Payer: Medicaid Other | Admitting: Podiatry

## 2021-09-29 ENCOUNTER — Ambulatory Visit: Payer: Medicaid Other

## 2021-09-29 DIAGNOSIS — M216X2 Other acquired deformities of left foot: Secondary | ICD-10-CM

## 2021-09-29 DIAGNOSIS — M19072 Primary osteoarthritis, left ankle and foot: Secondary | ICD-10-CM | POA: Diagnosis not present

## 2021-09-29 DIAGNOSIS — M25572 Pain in left ankle and joints of left foot: Secondary | ICD-10-CM

## 2021-09-29 DIAGNOSIS — M19079 Primary osteoarthritis, unspecified ankle and foot: Secondary | ICD-10-CM

## 2021-09-29 DIAGNOSIS — M25371 Other instability, right ankle: Secondary | ICD-10-CM

## 2021-09-29 NOTE — Progress Notes (Unsigned)
  Subjective:  Patient ID: [name] [name], male    DOB: 05/07/1955,  MRN: [id number]  Chief Complaint  Patient presents with   Ankle Pain    Right ankle pain, weakness    66 y.o. male presents with  ***  Past Medical History:  Diagnosis Date   Cellulitis    Chronic kidney disease    CVA (cerebral vascular accident) ([place]) 2014   Diabetes mellitus without complication ([place])    Gout    Hypertension    Obesity (BMI 30.0-34.9)    Sleep disorder     Allergies  Allergen Reactions   Ibuprofen     Other reaction(s): Other (See Comments) Contraindicated by his CKD    ROS: Negative except as per HPI above  Objective:  General: AAO x3, NAD  Dermatological: With inspection and palpation of the right and left lower extremities there are no open sores, no preulcerative lesions, no rash or signs of infection present. Nails are of normal length thickness and coloration.   Vascular:  Dorsalis Pedis artery and Posterior Tibial artery pedal pulses are 2/4 bilateral.  Capillary fill time brisk < 3 sec. Pedal hair growth present. No varicosities and no lower extremity edema present bilateral. There is no pain with calf compression, swelling, warmth, erythema.   Neruologic: Grossly intact via light touch bilateral. Protective threshold intact to all sites bilateral. Patellar and Achilles deep tendon reflexes 2+ bilateral. Negative Babinski reflex.   Musculoskeletal: No gross boney pedal deformities bilateral. No pain, crepitus, or limitation noted with foot and ankle range of motion bilateral. Muscular strength 5/5 in all groups tested bilateral.  Gait: Unassisted, Nonantalgic.   No images are attached to the encounter.  Radiographs:  Date: *** XR {right/left foot:16097} Weightbearing AP/Lateral/Oblique   Findings: {MP Foot XR:23762::"no fracture, dislocation, swelling or degenerative changes noted"} Assessment:   1. Arthritis of ankle   2. Acute left ankle pain   3. Ankle instability,  right      Plan:  Patient was evaluated and treated and all questions answered.  #*** -***  Return in about 6 weeks (around 11/10/2021) for Right ankle pain/ instability.          Everitt Amber, DPM [facility]

## 2021-10-09 DIAGNOSIS — I10 Essential (primary) hypertension: Secondary | ICD-10-CM | POA: Diagnosis not present

## 2021-10-19 DIAGNOSIS — E790 Hyperuricemia without signs of inflammatory arthritis and tophaceous disease: Secondary | ICD-10-CM | POA: Diagnosis not present

## 2021-10-19 DIAGNOSIS — N182 Chronic kidney disease, stage 2 (mild): Secondary | ICD-10-CM | POA: Diagnosis not present

## 2021-10-19 DIAGNOSIS — Z23 Encounter for immunization: Secondary | ICD-10-CM | POA: Diagnosis not present

## 2021-10-19 DIAGNOSIS — E782 Mixed hyperlipidemia: Secondary | ICD-10-CM | POA: Diagnosis not present

## 2021-10-19 DIAGNOSIS — E1169 Type 2 diabetes mellitus with other specified complication: Secondary | ICD-10-CM | POA: Diagnosis not present

## 2021-10-19 DIAGNOSIS — I10 Essential (primary) hypertension: Secondary | ICD-10-CM | POA: Diagnosis not present

## 2021-10-20 DIAGNOSIS — I358 Other nonrheumatic aortic valve disorders: Secondary | ICD-10-CM | POA: Diagnosis not present

## 2021-10-23 DIAGNOSIS — E782 Mixed hyperlipidemia: Secondary | ICD-10-CM | POA: Diagnosis not present

## 2021-10-23 DIAGNOSIS — R350 Frequency of micturition: Secondary | ICD-10-CM | POA: Diagnosis not present

## 2021-10-23 DIAGNOSIS — E1169 Type 2 diabetes mellitus with other specified complication: Secondary | ICD-10-CM | POA: Diagnosis not present

## 2021-11-10 ENCOUNTER — Ambulatory Visit (INDEPENDENT_AMBULATORY_CARE_PROVIDER_SITE_OTHER): Payer: HMO | Admitting: Podiatry

## 2021-11-10 DIAGNOSIS — Z91199 Patient's noncompliance with other medical treatment and regimen due to unspecified reason: Secondary | ICD-10-CM

## 2021-11-10 NOTE — Progress Notes (Signed)
Pt was a no show for appointment, charge generated 

## 2021-12-02 ENCOUNTER — Ambulatory Visit (INDEPENDENT_AMBULATORY_CARE_PROVIDER_SITE_OTHER): Payer: PPO | Admitting: Internal Medicine

## 2021-12-02 ENCOUNTER — Encounter: Payer: Self-pay | Admitting: Internal Medicine

## 2021-12-02 VITALS — BP 178/98 | HR 78 | Temp 97.8°F | Resp 16 | Ht 71.0 in | Wt 224.0 lb

## 2021-12-02 DIAGNOSIS — R3915 Urgency of urination: Secondary | ICD-10-CM

## 2021-12-02 NOTE — Progress Notes (Signed)
   Established Patient Office Visit  Subjective   Patient ID: [name] [name], male    DOB: 1955/10/12  Age: 66 y.o. MRN: [id number]  Chief Complaint  Patient presents with   Acute Visit    Frequent urination    HPI  66 years old male is here for urinary urgency. I have started him on Toviaz 4 mg daily last month but he says it does not make any difference. He wake up at night frequently, he can not hold his urine during day and leak before he reach bathroom.   He has diabetes mellitus with hbA1c of 8.1%. He is on Jordiance, he admit drinking diet coke and a lot  Gatorade.    Review of Systems  Constitutional: Negative.   Respiratory: Negative.    Genitourinary:  Positive for frequency and urgency.      Objective:     BP (!) 178/98 (BP Location: Right Arm, Patient Position: Sitting, Cuff Size: Normal)   Pulse 78   Temp 97.8 F (36.6 C) (Temporal)   Resp 16   Ht 5\' 11"  (1.803 m)   Wt 224 lb 0.2 oz (101.6 kg)   SpO2 99%   BMI 31.24 kg/m    Physical Exam Constitutional:      Appearance: Normal appearance. He is obese.  Cardiovascular:     Rate and Rhythm: Normal rate and regular rhythm.     Heart sounds: Normal heart sounds.  Abdominal:     General: Bowel sounds are normal.     Palpations: Abdomen is soft.  Neurological:     General: No focal deficit present.     Mental Status: He is alert and oriented to person, place, and time.      No results found for any visits on 12/02/21.   The ASCVD Risk score (Arnett DK, et al., 2019) failed to calculate for the following reasons:   The patient has a prior MI or stroke diagnosis    Assessment & Plan:   Problem List Items Addressed This Visit       Other   Urinary urgency - Primary    Return in about 3 months (around 03/04/2022).    03/06/2022, MD

## 2021-12-23 ENCOUNTER — Other Ambulatory Visit: Payer: Self-pay

## 2021-12-23 MED ORDER — METOPROLOL TARTRATE 75 MG PO TABS: 1.0000 | ORAL_TABLET | Freq: Every day | ORAL | 1 refills | Status: DC

## 2022-01-13 ENCOUNTER — Ambulatory Visit: Payer: PPO | Admitting: Internal Medicine

## 2022-01-13 ENCOUNTER — Encounter: Payer: Self-pay | Admitting: Internal Medicine

## 2022-01-13 VITALS — BP 138/78 | HR 75 | Temp 97.9°F | Resp 18 | Ht 71.0 in | Wt 220.4 lb

## 2022-01-13 DIAGNOSIS — N1831 Chronic kidney disease, stage 3a: Secondary | ICD-10-CM | POA: Diagnosis not present

## 2022-01-13 DIAGNOSIS — L97929 Non-pressure chronic ulcer of unspecified part of left lower leg with unspecified severity: Secondary | ICD-10-CM

## 2022-01-13 DIAGNOSIS — L97919 Non-pressure chronic ulcer of unspecified part of right lower leg with unspecified severity: Secondary | ICD-10-CM | POA: Diagnosis not present

## 2022-01-13 DIAGNOSIS — E785 Hyperlipidemia, unspecified: Secondary | ICD-10-CM

## 2022-01-13 DIAGNOSIS — E1122 Type 2 diabetes mellitus with diabetic chronic kidney disease: Secondary | ICD-10-CM

## 2022-01-13 DIAGNOSIS — I10 Essential (primary) hypertension: Secondary | ICD-10-CM | POA: Diagnosis not present

## 2022-01-13 DIAGNOSIS — I739 Peripheral vascular disease, unspecified: Secondary | ICD-10-CM | POA: Diagnosis not present

## 2022-01-13 MED ORDER — REPATHA SURECLICK 140 MG/ML ~~LOC~~ SOAJ: SUBCUTANEOUS | 6 refills | Status: DC

## 2022-01-13 NOTE — Assessment & Plan Note (Signed)
Not controlled, will restart repatha today.

## 2022-01-13 NOTE — Assessment & Plan Note (Signed)
He has CKD 3a and will repeat labs today.

## 2022-01-13 NOTE — Assessment & Plan Note (Signed)
I will refer him to wound center here in [facility]

## 2022-01-13 NOTE — Progress Notes (Signed)
Office Visit  Subjective   Patient ID: [name] [name]   DOB: 10-30-1955   Age: 67 y.o.   MRN: [id number]   Chief Complaint Chief Complaint  Patient presents with   Office visit    Wounds on legs.     History of Present Illness The patient is a 67 year old Caucasian/White male who presents with follow up. He says that he developed wound on both lower legs and the side.  He has that wound before and I have referred him to wound center and his wounds were healed.  He has a minimum swelling of his lower extremity but he also has a peripheral arterial disease for that he follows with vascular surgery every 6 months. Lately he has not been checking his sugar at home. His last HbA1c was 8.4% in 10/23.  He denies any further significant symptoms. He denies foot ulcers and recurrent urinary tract infections.  He states he exercises several times per week.  The patient's past medical history is: 67 Gout, Heart surgery, and Stroke. There is no history of foot ulcers and pancreatitis.  His last eye exam was 3 months by [facility] care and no diabetic changes were noted.  He see foot doctor. He saw [facility] 7/23 and no diabetic retinopathy noted.   [name] [name] returns today for routine followup on his cholesterol. Overall, he states he is doing well and is without any complaints or problems at this time. He specifically denies chest pain, abdominal pain, nausea, diarrhea, and myalgias. He remains on dietary management as well as the following cholesterol lowering medications atorvastatin 80 mg tablet. He says repatha was not filled by pharmacy and has not been taking it.  The patient is a 67 year old Caucasian/White male who presents for a follow-up evaluation of hypertension. The patient has not been checking his blood pressure at home. The patient's current medications include: amlodipine oral tablet 5 mg, hydrochlorothiazide oral tablet 25 mg, lisinopril 40 mg tablet, and METOPROLOL 75MG  TAB. The patient  has been tolerating his medications well. The patient denies any chest pain, shortness of breath, orthopnea, and PND.    He has CAD s/p CABG in 2021. No more chest pain and he follows with cardiologist once a year.   He also has hyperuricemia and take allopurinol. He also has CKD 2-3 and it varies, will repeat labs today.  He also has PVD and aortic aneurysm. He has develop ulcer on both lower inner leg again. He has balloon angioplasty done and he was seen vascular surgery but I could not find note about aneurysm.    Past Medical History Past Medical History:  Diagnosis Date   Cellulitis    Chronic kidney disease    CVA (cerebral vascular accident) ([name]) 2014   Diabetes mellitus without complication ([place])    Gout    Hypertension    Obesity (BMI 30.0-34.9)    Sleep disorder      Allergies Allergies  Allergen Reactions   Ibuprofen     Other reaction(s): Other (See Comments) Contraindicated by his CKD     Review of Systems Review of Systems  Constitutional: Negative.   HENT: Negative.    Respiratory: Negative.    Cardiovascular: Negative.   Gastrointestinal: Negative.   Neurological: Negative.        Objective:    Vitals BP 138/78 (BP Location: Left Arm, Patient Position: Sitting, Cuff Size: Normal)   Pulse 75   Temp 97.9 F (36.6 C)  Resp 18   Ht 5\' 11"  (1.803 m)   Wt 220 lb 6 oz (100 kg)   SpO2 97%   BMI 30.74 kg/m    Physical Examination Physical Exam Constitutional:      Appearance: Normal appearance. He is normal weight.  HENT:     Head: Normocephalic and atraumatic.  Eyes:     Extraocular Movements: Extraocular movements intact.     Pupils: Pupils are equal, round, and reactive to light.  Cardiovascular:     Rate and Rhythm: Normal rate and regular rhythm.     Heart sounds: Normal heart sounds.  Pulmonary:     Effort: Pulmonary effort is normal.     Breath sounds: Normal breath sounds.  Abdominal:     General: Bowel sounds are normal.      Palpations: Abdomen is soft.  Skin:    Comments: He has ulcer inner both lower legs just above ankle.   Neurological:     General: No focal deficit present.     Mental Status: He is alert and oriented to person, place, and time.        Assessment & Plan:   Benign essential hypertension controlled  Ulcers of both lower legs ([place]) I will refer him to wound center here in [place]  Type 2 diabetes mellitus with diabetic chronic kidney disease ([place]) He has CKD 3a and will repeat labs today.   Dyslipidemia Not controlled, will restart repatha today.     Return in about 1 month (around 02/13/2022).   Garwin Brothers, MD

## 2022-01-13 NOTE — Assessment & Plan Note (Signed)
controlled 

## 2022-01-14 LAB — CMP14 + ANION GAP
ALT: 15 IU/L (ref 0–44)
AST: 15 IU/L (ref 0–40)
Albumin/Globulin Ratio: 1.6 (ref 1.2–2.2)
Albumin: 4.3 g/dL (ref 3.9–4.9)
Alkaline Phosphatase: 87 IU/L (ref 44–121)
Anion Gap: 14 mmol/L (ref 10.0–18.0)
BUN/Creatinine Ratio: 19 (ref 10–24)
BUN: 35 mg/dL — ABNORMAL HIGH (ref 8–27)
Bilirubin Total: 0.5 mg/dL (ref 0.0–1.2)
CO2: 25 mmol/L (ref 20–29)
Calcium: 9.7 mg/dL (ref 8.6–10.2)
Chloride: 103 mmol/L (ref 96–106)
Creatinine, Ser: 1.84 mg/dL — ABNORMAL HIGH (ref 0.76–1.27)
Globulin, Total: 2.7 g/dL (ref 1.5–4.5)
Glucose: 161 mg/dL — ABNORMAL HIGH (ref 70–99)
Potassium: 4.8 mmol/L (ref 3.5–5.2)
Sodium: 142 mmol/L (ref 134–144)
Total Protein: 7 g/dL (ref 6.0–8.5)
eGFR: 40 mL/min/{1.73_m2} — ABNORMAL LOW (ref >59)

## 2022-01-14 LAB — HEMOGLOBIN A1C
Est. average glucose Bld gHb Est-mCnc: 220 mg/dL
Hgb A1c MFr Bld: 9.3 % — ABNORMAL HIGH (ref 4.8–5.6)

## 2022-01-28 ENCOUNTER — Ambulatory Visit: Payer: PPO | Admitting: Internal Medicine

## 2022-01-28 ENCOUNTER — Encounter: Payer: Self-pay | Admitting: Internal Medicine

## 2022-01-28 VITALS — BP 140/98 | HR 98 | Temp 98.0°F | Resp 18 | Ht 71.0 in | Wt 220.0 lb

## 2022-01-28 DIAGNOSIS — L97929 Non-pressure chronic ulcer of unspecified part of left lower leg with unspecified severity: Secondary | ICD-10-CM

## 2022-01-28 DIAGNOSIS — L97919 Non-pressure chronic ulcer of unspecified part of right lower leg with unspecified severity: Secondary | ICD-10-CM | POA: Insufficient documentation

## 2022-01-28 DIAGNOSIS — M79661 Pain in right lower leg: Secondary | ICD-10-CM | POA: Diagnosis not present

## 2022-01-28 DIAGNOSIS — I83029 Varicose veins of left lower extremity with ulcer of unspecified site: Secondary | ICD-10-CM | POA: Diagnosis not present

## 2022-01-28 DIAGNOSIS — M79662 Pain in left lower leg: Secondary | ICD-10-CM | POA: Diagnosis not present

## 2022-01-28 DIAGNOSIS — I83019 Varicose veins of right lower extremity with ulcer of unspecified site: Secondary | ICD-10-CM

## 2022-01-28 MED ORDER — FREESTYLE LIBRE 3 READER DEVI: 1.0000 | 6 refills | Status: DC

## 2022-01-28 MED ORDER — FREESTYLE LIBRE 3 SENSOR MISC: 1.0000 | 6 refills | Status: DC

## 2022-01-28 MED ORDER — OXYCODONE-ACETAMINOPHEN 7.5-325 MG PO TABS: 1.0000 | ORAL_TABLET | Freq: Three times a day (TID) | ORAL | 0 refills | Status: AC | PRN

## 2022-01-28 NOTE — Assessment & Plan Note (Signed)
He will continue to follow at wound care.  He barely could walk in our office.

## 2022-01-28 NOTE — Progress Notes (Signed)
   Acute Office Visit  Subjective:     Patient ID: [name] [name], male    DOB: 27-Aug-1955, 67 y.o.   MRN: [id number]  Chief Complaint  Patient presents with   Leg Pain    Rt./Lt. lower legs.    Leg Pain    Patient is in today for pain in both lower legs.  He has venous stasis ulcer in both lower legs and follows at wound care center in [place].  He was recently diagnosed with MRSA infection in his wound.  He was started on antibiotic.  He tells me that his pain is so much that he cannot walk and he also could not sleep last night because of pain.  He has a coronary artery disease and he was advised by his cardiologist not to take any NSAID.  He also has a chronic kidney disease and I stopped his tramadol also.  He take Tylenol but that does not help. He also says that his freestyle libre does not read blood sugar.  So if he discarded that.  He need new 1.  Review of Systems  Musculoskeletal:        Pain in both lower legs        Objective:    BP (!) 140/98 (BP Location: Left Arm, Patient Position: Sitting, Cuff Size: Normal)   Pulse 98   Temp 98 F (36.7 C)   Resp 18   Ht 5\' 11"  (1.803 m)   Wt 220 lb (99.8 kg)   SpO2 99%   BMI 30.68 kg/m    Physical Exam Constitutional:      Appearance: He is obese.  Skin:    Comments: He has dressing in his both lower leg from wound care.     No results found for any visits on 01/28/22.      Assessment & Plan:   Problem List Items Addressed This Visit       Musculoskeletal and Integument   Venous stasis ulcers of both lower extremities ([place])    He will continue to follow at wound care.  He barely could walk in our office.        Other   Pain in both lower legs - Primary    I will start him on Percocet 1 tablet 3 times a day as needed to see if that controlled his pain.       Meds ordered this encounter  Medications   Continuous Blood Gluc Receiver (FREESTYLE LIBRE 3 READER) DEVI    Sig: 1 each by Does not  apply route every 14 (fourteen) days.    Dispense:  2 each    Refill:  6   Continuous Blood Gluc Sensor (FREESTYLE LIBRE 3 SENSOR) MISC    Sig: 1 each by Does not apply route every 14 (fourteen) days.    Dispense:  2 each    Refill:  6   oxyCODONE-acetaminophen (PERCOCET) 7.5-325 MG tablet    Sig: Take 1 tablet by mouth every 8 (eight) hours as needed for up to 7 days for severe pain.    Dispense:  28 tablet    Refill:  0    No follow-ups on file.  Garwin Brothers, MD

## 2022-01-28 NOTE — Assessment & Plan Note (Signed)
I will start him on Percocet 1 tablet 3 times a day as needed to see if that controlled his pain.

## 2022-02-15 ENCOUNTER — Encounter: Payer: Self-pay | Admitting: Internal Medicine

## 2022-02-15 ENCOUNTER — Ambulatory Visit: Payer: PPO | Admitting: Internal Medicine

## 2022-02-15 VITALS — BP 138/96 | HR 52 | Temp 98.0°F | Resp 18 | Ht 68.0 in | Wt 242.5 lb

## 2022-02-15 DIAGNOSIS — E1122 Type 2 diabetes mellitus with diabetic chronic kidney disease: Secondary | ICD-10-CM | POA: Diagnosis not present

## 2022-02-15 DIAGNOSIS — N1832 Chronic kidney disease, stage 3b: Secondary | ICD-10-CM | POA: Diagnosis not present

## 2022-02-15 DIAGNOSIS — I739 Peripheral vascular disease, unspecified: Secondary | ICD-10-CM | POA: Diagnosis not present

## 2022-02-15 DIAGNOSIS — J0100 Acute maxillary sinusitis, unspecified: Secondary | ICD-10-CM

## 2022-02-15 DIAGNOSIS — J329 Chronic sinusitis, unspecified: Secondary | ICD-10-CM | POA: Insufficient documentation

## 2022-02-15 MED ORDER — OXYCODONE-ACETAMINOPHEN 7.5-325 MG PO TABS: 1.0000 | ORAL_TABLET | Freq: Two times a day (BID) | ORAL | 0 refills | Status: DC | PRN

## 2022-02-15 NOTE — Assessment & Plan Note (Signed)
I will do CT scan sinus.

## 2022-02-15 NOTE — Progress Notes (Signed)
Office Visit  Subjective   Patient ID: [name] [name]   DOB: 1955/06/22   Age: 67 y.o.   MRN: [id number]   Chief Complaint No chief complaint on file.    History of Present Illness The patient is a 67 year old Caucasian/White male who presents with follow up. He says he has head cold for 3 months, he use nasal spray daily. He also feel pressure when he bend forward. He also has pain in his both feet, his wound are healing slowly in his leg. He goes to wound center. that he developed wound on both lower legs and the side. He is c/o increased pain in both legs and is barely able to walk. Tramadol was not helping so I have given him percocet on last visit, patient says it help with his pain. He is hurting even sitting on chair but pain get worse when he walk. He has peripheral arterial disease and follows with vascular surgeon in [place]. He has swelling in his right leg, he says he does not take lasix any more. He says his sugar is getting better, but his HbA1c was 9.6% in 1/24. But his renal function got further worse with gfr was 40 in 01/13/22.   Marland Kitchen     He also has PVD and aortic aneurysm. He has develop ulcer on both lower inner leg again. He has balloon angioplasty done and he was seen vascular surgery but I could not find note about aneurysm.    Past Medical History Past Medical History:  Diagnosis Date   Cellulitis    Chronic kidney disease    CVA (cerebral vascular accident) ([place]) 2014   Diabetes mellitus without complication ([place])    Gout    Hypertension    Obesity (BMI 30.0-34.9)    Sleep disorder      Allergies Allergies  Allergen Reactions   Ibuprofen     Other reaction(s): Other (See Comments) Contraindicated by his CKD     Review of Systems Review of Systems  Constitutional: Negative.   HENT:  Positive for sinus pain.   Cardiovascular: Negative.   Musculoskeletal:        Leg pain       Objective:    Vitals BP (!) 138/96 (BP Location: Left Arm, Patient  Position: Sitting, Cuff Size: Normal)   Pulse (!) 52   Temp 98 F (36.7 C)   Resp 18   Ht 5' 8"$  (1.727 m)   Wt 242 lb 8 oz (110 kg)   SpO2 95%   BMI 36.87 kg/m    Physical Examination Physical Exam Constitutional:      Appearance: He is obese.  Eyes:     Extraocular Movements: Extraocular movements intact.     Pupils: Pupils are equal, round, and reactive to light.  Cardiovascular:     Rate and Rhythm: Normal rate and regular rhythm.     Heart sounds: Normal heart sounds.  Abdominal:     General: Bowel sounds are normal.     Palpations: Abdomen is soft.  Musculoskeletal:        General: Swelling and tenderness present.     Right lower leg: Edema present.  Neurological:     Mental Status: He is alert.        Assessment & Plan:   Type 2 diabetes mellitus with diabetic chronic kidney disease ([place]) His sugar is uncontrolled and renal function is also getting worse, will repeat labs nexxt month.  PAD (peripheral artery disease) (  [place]) He barely can walk, I will give him percocet and he will continue to follow with vascular surgeon and wound center.  Sinusitis I will do CT scan sinus.    Return in about 1 month (around 03/16/2022).   Garwin Brothers, MD

## 2022-02-15 NOTE — Assessment & Plan Note (Signed)
He barely can walk, I will give him percocet and he will continue to follow with vascular surgeon and wound center.

## 2022-02-15 NOTE — Assessment & Plan Note (Signed)
His sugar is uncontrolled and renal function is also getting worse, will repeat labs nexxt month.

## 2022-03-02 ENCOUNTER — Other Ambulatory Visit: Payer: Self-pay | Admitting: Internal Medicine

## 2022-03-02 MED ORDER — COLCHICINE 0.6 MG PO TABS: 0.6000 mg | ORAL_TABLET | Freq: Every day | ORAL | 2 refills | Status: DC | PRN

## 2022-03-03 ENCOUNTER — Ambulatory Visit: Payer: Self-pay

## 2022-03-03 ENCOUNTER — Ambulatory Visit (HOSPITAL_COMMUNITY): Payer: Self-pay

## 2022-03-03 ENCOUNTER — Ambulatory Visit: Payer: PPO | Admitting: Internal Medicine

## 2022-03-08 ENCOUNTER — Ambulatory Visit: Payer: PPO | Admitting: Internal Medicine

## 2022-03-08 ENCOUNTER — Other Ambulatory Visit: Payer: Self-pay | Admitting: Internal Medicine

## 2022-03-08 ENCOUNTER — Encounter: Payer: Self-pay | Admitting: Internal Medicine

## 2022-03-08 VITALS — BP 134/80 | HR 80 | Temp 97.3°F | Resp 18 | Ht 68.0 in | Wt 219.0 lb

## 2022-03-08 DIAGNOSIS — I10 Essential (primary) hypertension: Secondary | ICD-10-CM

## 2022-03-08 MED ORDER — ALLOPURINOL 300 MG PO TABS: 300.0000 mg | ORAL_TABLET | Freq: Every day | ORAL | 11 refills | Status: AC

## 2022-03-08 MED ORDER — GLIMEPIRIDE 2 MG PO TABS: 2.0000 mg | ORAL_TABLET | ORAL | 11 refills | Status: AC

## 2022-03-08 MED ORDER — FREESTYLE LIBRE 3 READER DEVI: 1.0000 | 6 refills | Status: AC

## 2022-03-08 MED ORDER — TRULICITY 3 MG/0.5ML ~~LOC~~ SOAJ: 3.0000 mg | SUBCUTANEOUS | 4 refills | Status: DC

## 2022-03-08 MED ORDER — TRAMADOL HCL 50 MG PO TABS: 50.0000 mg | ORAL_TABLET | Freq: Three times a day (TID) | ORAL | 2 refills | Status: DC | PRN

## 2022-03-08 MED ORDER — COLCHICINE 0.6 MG PO TABS: 0.6000 mg | ORAL_TABLET | Freq: Every day | ORAL | 2 refills | Status: DC | PRN

## 2022-03-08 NOTE — Progress Notes (Signed)
   Office Visit  Subjective   Patient ID: ARMON SCAFF   DOB: [birth year]   Age: 67 y.o.   MRN: [id number]   Chief Complaint Chief Complaint  Patient presents with   office visit    Patient both legs and ankle gout pain      History of Present Illness The patient is a 67 year old Caucasian/White male who presents with follow up. He says he has head cold for 3 months, he use nasal spray daily. He also feel pressure when he bend forward. He also has pain in his both feet, his wound are healing slowly in his leg. He goes to wound center. that he developed wound on both lower legs and the side. He is c/o increased pain in both legs and is barely able to walk. Tramadol was not helping so I have given him percocet on last visit, patient says it help with his pain. He is hurting even sitting on chair but pain get worse when he walk. He has peripheral arterial disease and follows with vascular surgeon in [place]. He has swelling in his right leg, he says he does not take lasix any more. He says his sugar is getting better, but his HbA1c was 9.6% in 1/24. But his renal function got further worse with gfr was 40 in 01/13/22.    Marland Kitchen     He also has PVD and aortic aneurysm. He has develop ulcer on both lower inner leg again. He has balloon angioplasty done and he was seen vascular surgery but I could not find note about aneurysm.    Past Medical History Past Medical History:  Diagnosis Date   Cellulitis    Chronic kidney disease    CVA (cerebral vascular accident) ([place]) 2014   Diabetes mellitus without complication ([place])    Gout    Hypertension    Obesity (BMI 30.0-34.9)    Sleep disorder      Allergies Allergies  Allergen Reactions   Ibuprofen     Other reaction(s): Other (See Comments) Contraindicated by his CKD     Review of Systems ROS     Objective:    Vitals BP 134/80 (BP Location: Left Arm, Patient Position: Sitting, Cuff Size: Normal)   Pulse 80   Temp (!) 97.3 F (36.3  C)   Resp 18   Ht '5\' 8"'$  (1.727 m)   Wt 219 lb (99.3 kg)   SpO2 97%   BMI 33.30 kg/m    Physical Examination Physical Exam     Assessment & Plan:   No problem-specific Assessment & Plan notes found for this encounter.    No follow-ups on file.   Garwin Brothers, MD

## 2022-03-08 NOTE — Telephone Encounter (Signed)
Please refill per amin

## 2022-03-15 ENCOUNTER — Ambulatory Visit: Payer: PPO | Admitting: Internal Medicine

## 2022-03-24 ENCOUNTER — Ambulatory Visit (INDEPENDENT_AMBULATORY_CARE_PROVIDER_SITE_OTHER)
Admission: RE | Admit: 2022-03-24 | Discharge: 2022-03-24 | Disposition: A | Discharge status: Home / Self Care | Payer: PPO | Source: Ambulatory Visit | Attending: Physician Assistant | Admitting: Physician Assistant

## 2022-03-24 ENCOUNTER — Ambulatory Visit (INDEPENDENT_AMBULATORY_CARE_PROVIDER_SITE_OTHER): Payer: PPO | Admitting: Physician Assistant

## 2022-03-24 ENCOUNTER — Ambulatory Visit (HOSPITAL_COMMUNITY)
Admission: RE | Admit: 2022-03-24 | Discharge: 2022-03-24 | Disposition: A | Discharge status: Home / Self Care | Payer: PPO | Source: Ambulatory Visit | Attending: Physician Assistant | Admitting: Physician Assistant

## 2022-03-24 VITALS — BP 165/104 | HR 100 | Temp 98.0°F | Ht 71.0 in | Wt 220.0 lb

## 2022-03-24 DIAGNOSIS — I872 Venous insufficiency (chronic) (peripheral): Secondary | ICD-10-CM | POA: Diagnosis not present

## 2022-03-24 DIAGNOSIS — I739 Peripheral vascular disease, unspecified: Secondary | ICD-10-CM | POA: Diagnosis not present

## 2022-03-24 LAB — VAS US ABI WITH/WO TBI
Left ABI: 0.54
Right ABI: UNDETERMINED

## 2022-03-24 NOTE — Progress Notes (Signed)
Office Note     CC:  follow up Requesting Provider:  Garwin Brothers, MD  HPI: [name] [name] is a 67 y.o. (1955-05-24) male who presents for a wound check as well as surveillance of PAD.  He underwent atherectomy and balloon angioplasty of the right PTA and TP trunk by Dr. Donzetta Matters on 10/13/2020 due to critical right lower extremity ischemia with leg ulceration.  Postoperatively he was able to heal the ulceration.  He however has developed a new ulceration 3 months ago which has been cared for at the wound care center.  He has known mixed PAD and venous insufficiency.  He has been unable to wear compression due to the extent of the wound.  He states the wound is slowly improving however he is frustrated with the amount of progress he has made recently.  He denies tobacco use.  Past medical history also significant for diabetes mellitus and chronic kidney disease.  Last hemoglobin A1c was 9.4.  It should also be noted that he has been treated with antibiotics for his current wound due to culture demonstrating MRSA growth.    Past Medical History:  Diagnosis Date   Cellulitis    Chronic kidney disease    CVA (cerebral vascular accident) ([facility]) 2014   Diabetes mellitus without complication ([place])    Gout    Hypertension    Obesity (BMI 30.0-34.9)    Sleep disorder     Past Surgical History:  Procedure Laterality Date   ABDOMINAL AORTOGRAM W/LOWER EXTREMITY N/A 10/13/2020   Procedure: ABDOMINAL AORTOGRAM W/LOWER EXTREMITY;  Surgeon: Waynetta Sandy, MD;  Location: [facility];  Service: Cardiovascular;  Laterality: N/A;   Arthroscopic knee surgery     PERIPHERAL VASCULAR ATHERECTOMY  10/13/2020   Procedure: PERIPHERAL VASCULAR ATHERECTOMY;  Surgeon: Waynetta Sandy, MD;  Location: [facility];  Service: Cardiovascular;;  posterior tibial artery   PERIPHERAL VASCULAR BALLOON ANGIOPLASTY  10/13/2020   Procedure: PERIPHERAL VASCULAR BALLOON ANGIOPLASTY;  Surgeon:  Waynetta Sandy, MD;  Location: [facility];  Service: Cardiovascular;;  posterior tibial artery   PERIPHERAL VASCULAR INTERVENTION Right 03/11/2017   Procedure: PERIPHERAL VASCULAR INTERVENTION;  Surgeon: Elam Dutch, MD;  Location: [facility];  Service: Cardiovascular;  Laterality: Right;  right renal artery stent   RENAL ANGIOGRAPHY N/A 03/11/2017   Procedure: RENAL ANGIOGRAPHY;  Surgeon: Elam Dutch, MD;  Location: [facility];  Service: Cardiovascular;  Laterality: N/A;   TONSILLECTOMY      Social History   Socioeconomic History   Marital status: Married    Spouse name: Not on file   Number of children: Not on file   Years of education: Not on file   Highest education level: Not on file  Occupational History   Not on file  Tobacco Use   Smoking status: Never   Smokeless tobacco: Never  Vaping Use   Vaping Use: Never used  Substance and Sexual Activity   Alcohol use: Not Currently   Drug use: No   Sexual activity: Not on file  Other Topics Concern   Not on file  Social History Narrative   Pt ambulates in the hallway. He socialize with staff    Social Determinants of Health   Financial Resource Strain: Not on file  Food Insecurity: Not on file  Transportation Needs: Not on file  Physical Activity: Not on file  Stress: Not on file  Social Connections: Not on file  Intimate  Partner Violence: Not on file    Family History  Problem Relation Age of Onset   Hypertension Mother    Hypertension Father    Colon cancer Paternal Grandmother     Current Outpatient Medications  Medication Sig Dispense Refill   allopurinol (ZYLOPRIM) 300 MG tablet Take 1 tablet (300 mg total) by mouth daily. 30 tablet 11   colchicine 0.6 MG tablet Take 1 tablet (0.6 mg total) by mouth daily as needed (gout). 30 tablet 2   Continuous Blood Gluc Receiver (FREESTYLE LIBRE 3 READER) DEVI 1 each by Does not apply route every 14 (fourteen) days. 2 each 6    Continuous Blood Gluc Sensor (FREESTYLE LIBRE 3 SENSOR) MISC 1 each by Does not apply route every 14 (fourteen) days. 2 each 6   Dulaglutide (TRULICITY) 3 0000000 SOPN Inject 3 mg as directed once a week. 4 mL 4   glimepiride (AMARYL) 2 MG tablet Take 1 tablet (2 mg total) by mouth every morning. 30 tablet 11   JARDIANCE 25 MG TABS tablet Take 25 mg by mouth every morning.     lisinopril (ZESTRIL) 10 MG tablet Take 10 mg by mouth daily. (Patient not taking: Reported on 05/29/2021)     Metoprolol Tartrate 75 MG TABS Take 1 tablet by mouth daily. 60 tablet 1   nitroGLYCERIN (NITROSTAT) 0.4 MG SL tablet Place 0.4 mg under the tongue every 5 (five) minutes as needed for chest pain.      oxymetazoline (AFRIN) 0.05 % nasal spray Place 1 spray into both nostrils 2 (two) times daily as needed for congestion.     REPATHA SURECLICK XX123456 MG/ML SOAJ XX123456 mg sub cutaneous every 2 week 2 mL 6   SANTYL 250 UNIT/GM ointment Apply topically.     traMADol (ULTRAM) 50 MG tablet Take 1 tablet (50 mg total) by mouth every 8 (eight) hours as needed. 90 tablet 2   Current Facility-Administered Medications  Medication Dose Route Frequency Provider Last Rate Last Admin   labetalol (NORMODYNE) tablet 200 mg  200 mg Oral Once Elam Dutch, MD        Allergies  Allergen Reactions   Ibuprofen     Other reaction(s): Other (See Comments) Contraindicated by his CKD     REVIEW OF SYSTEMS:   [X]  denotes positive finding, [ ]  denotes negative finding Cardiac  Comments:  Chest pain or chest pressure:    Shortness of breath upon exertion:    Short of breath when lying flat:    Irregular heart rhythm:        Vascular    Pain in calf, thigh, or hip brought on by ambulation:    Pain in feet at night that wakes you up from your sleep:     Blood clot in your veins:    Leg swelling:         Pulmonary    Oxygen at home:    Productive cough:     Wheezing:         Neurologic    Sudden weakness in arms or legs:      Sudden numbness in arms or legs:     Sudden onset of difficulty speaking or slurred speech:    Temporary loss of vision in one eye:     Problems with dizziness:         Gastrointestinal    Blood in stool:     Vomited blood:         Genitourinary  Burning when urinating:     Blood in urine:        Psychiatric    Major depression:         Hematologic    Bleeding problems:    Problems with blood clotting too easily:        Skin    Rashes or ulcers:        Constitutional    Fever or chills:      PHYSICAL EXAMINATION:  Vitals:   03/24/22 0951  BP: (!) 165/104  Pulse: 100  Temp: 98 F (36.7 C)  TempSrc: Temporal  SpO2: 97%  Weight: 220 lb (99.8 kg)  Height: 5\' 11"  (1.803 m)    General:  WDWN in NAD; vital signs documented above Gait: Not observed HENT: WNL, normocephalic Pulmonary: normal non-labored breathing , without Rales, rhonchi,  wheezing Cardiac: regular HR Abdomen: soft, NT, no masses Skin: without rashes Vascular Exam/Pulses: Palpable left femoral pulse palpable right femoral pulse absent right pedal pulses Extremities: Wound of the posterior right calf about 3 x 3 cm Musculoskeletal: no muscle wasting or atrophy  Neurologic: A&O X 3;  No focal weakness or paresthesias are detected Psychiatric:  The pt has Normal affect.   Non-Invasive Vascular Imaging:   Right lower extremity arterial duplex demonstrates triphasic waveform down to the level of the distal popliteal which then becomes monophasic.  He has severe occlusive disease of the tibial arteries  Unable to obtain right ABI due to location of the wound however he does have a toe pressure of 72 mmHg  ASSESSMENT/PLAN:: 67 y.o. male here for follow up for evaluation of new right leg wound with history of known PAD and venous insufficiency  -Patient presents with a slow to heal wound of his posterior right calf which has been being cared for by the wound clinic.  He has known arterial and venous  insufficiency.  He states the wound has been present for about 3 months.  Arterial duplex demonstrates triphasic flow to the distal popliteal artery which then becomes monophasic.  He also has evidence of severe occlusive tibial disease.  Given the extent of the wound and lack of progress recently we will repeat angiogram.  Previously he underwent atherectomy and balloon angioplasty of the TP trunk and posterior tibial artery.  We will repeat angiogram with Dr. Donzetta Matters on the next available date.  Case including risks were discussed with the patient and he agrees to proceed.   Dagoberto Ligas, PA-C [facility] [phone]  Clinic MD:   Donzetta Matters

## 2022-03-24 NOTE — H&P (View-Only) (Signed)
Office Note     CC:  follow up Requesting Provider:  Amin, Saad, MD  HPI: [name] [name] is a 67 y.o. (06/08/1955) male who presents for a wound check as well as surveillance of PAD.  He underwent atherectomy and balloon angioplasty of the right PTA and TP trunk by Dr. Cain on 10/13/2020 due to critical right lower extremity ischemia with leg ulceration.  Postoperatively he was able to heal the ulceration.  He however has developed a new ulceration 3 months ago which has been cared for at the wound care center.  He has known mixed PAD and venous insufficiency.  He has been unable to wear compression due to the extent of the wound.  He states the wound is slowly improving however he is frustrated with the amount of progress he has made recently.  He denies tobacco use.  Past medical history also significant for diabetes mellitus and chronic kidney disease.  Last hemoglobin A1c was 9.4.  It should also be noted that he has been treated with antibiotics for his current wound due to culture demonstrating MRSA growth.    Past Medical History:  Diagnosis Date   Cellulitis    Chronic kidney disease    CVA (cerebral vascular accident) ([facility]) 2014   Diabetes mellitus without complication ([facility])    Gout    Hypertension    Obesity (BMI 30.0-34.9)    Sleep disorder     Past Surgical History:  Procedure Laterality Date   ABDOMINAL AORTOGRAM W/LOWER EXTREMITY N/A 10/13/2020   Procedure: ABDOMINAL AORTOGRAM W/LOWER EXTREMITY;  Surgeon: Cain, Brandon [name], MD;  Location: [facility];  Service: Cardiovascular;  Laterality: N/A;   Arthroscopic knee surgery     PERIPHERAL VASCULAR ATHERECTOMY  10/13/2020   Procedure: PERIPHERAL VASCULAR ATHERECTOMY;  Surgeon: Cain, Brandon [name], MD;  Location: [facility];  Service: Cardiovascular;;  posterior tibial artery   PERIPHERAL VASCULAR BALLOON ANGIOPLASTY  10/13/2020   Procedure: PERIPHERAL VASCULAR BALLOON ANGIOPLASTY;  Surgeon:  Cain, Brandon [name], MD;  Location: [facility];  Service: Cardiovascular;;  posterior tibial artery   PERIPHERAL VASCULAR INTERVENTION Right 03/11/2017   Procedure: PERIPHERAL VASCULAR INTERVENTION;  Surgeon: Fields, [name], MD;  Location: [facility];  Service: Cardiovascular;  Laterality: Right;  right renal artery stent   RENAL ANGIOGRAPHY N/A 03/11/2017   Procedure: RENAL ANGIOGRAPHY;  Surgeon: Fields, [name], MD;  Location: [facility];  Service: Cardiovascular;  Laterality: N/A;   TONSILLECTOMY      Social History   Socioeconomic History   Marital status: Married    Spouse name: Not on file   Number of children: Not on file   Years of education: Not on file   Highest education level: Not on file  Occupational History   Not on file  Tobacco Use   Smoking status: Never   Smokeless tobacco: Never  Vaping Use   Vaping Use: Never used  Substance and Sexual Activity   Alcohol use: Not Currently   Drug use: No   Sexual activity: Not on file  Other Topics Concern   Not on file  Social History Narrative   Pt ambulates in the hallway. He socialize with staff    Social Determinants of Health   Financial Resource Strain: Not on file  Food Insecurity: Not on file  Transportation Needs: Not on file  Physical Activity: Not on file  Stress: Not on file  Social Connections: Not on file  Intimate   Partner Violence: Not on file    Family History  Problem Relation Age of Onset   Hypertension Mother    Hypertension Father    Colon cancer Paternal Grandmother     Current Outpatient Medications  Medication Sig Dispense Refill   allopurinol (ZYLOPRIM) 300 MG tablet Take 1 tablet (300 mg total) by mouth daily. 30 tablet 11   colchicine 0.6 MG tablet Take 1 tablet (0.6 mg total) by mouth daily as needed (gout). 30 tablet 2   Continuous Blood Gluc Receiver (FREESTYLE LIBRE 3 READER) DEVI 1 each by Does not apply route every 14 (fourteen) days. 2 each 6    Continuous Blood Gluc Sensor (FREESTYLE LIBRE 3 SENSOR) MISC 1 each by Does not apply route every 14 (fourteen) days. 2 each 6   Dulaglutide (TRULICITY) 3 MG/0.5ML SOPN Inject 3 mg as directed once a week. 4 mL 4   glimepiride (AMARYL) 2 MG tablet Take 1 tablet (2 mg total) by mouth every morning. 30 tablet 11   JARDIANCE 25 MG TABS tablet Take 25 mg by mouth every morning.     lisinopril (ZESTRIL) 10 MG tablet Take 10 mg by mouth daily. (Patient not taking: Reported on 05/29/2021)     Metoprolol Tartrate 75 MG TABS Take 1 tablet by mouth daily. 60 tablet 1   nitroGLYCERIN (NITROSTAT) 0.4 MG SL tablet Place 0.4 mg under the tongue every 5 (five) minutes as needed for chest pain.      oxymetazoline (AFRIN) 0.05 % nasal spray Place 1 spray into both nostrils 2 (two) times daily as needed for congestion.     REPATHA SURECLICK 140 MG/ML SOAJ 140 mg sub cutaneous every 2 week 2 mL 6   SANTYL 250 UNIT/GM ointment Apply topically.     traMADol (ULTRAM) 50 MG tablet Take 1 tablet (50 mg total) by mouth every 8 (eight) hours as needed. 90 tablet 2   Current Facility-Administered Medications  Medication Dose Route Frequency Provider Last Rate Last Admin   labetalol (NORMODYNE) tablet 200 mg  200 mg Oral Once Fields, [name], MD        Allergies  Allergen Reactions   Ibuprofen     Other reaction(s): Other (See Comments) Contraindicated by his CKD     REVIEW OF SYSTEMS:   [X] denotes positive finding, [ ] denotes negative finding Cardiac  Comments:  Chest pain or chest pressure:    Shortness of breath upon exertion:    Short of breath when lying flat:    Irregular heart rhythm:        Vascular    Pain in calf, thigh, or hip brought on by ambulation:    Pain in feet at night that wakes you up from your sleep:     Blood clot in your veins:    Leg swelling:         Pulmonary    Oxygen at home:    Productive cough:     Wheezing:         Neurologic    Sudden weakness in arms or legs:      Sudden numbness in arms or legs:     Sudden onset of difficulty speaking or slurred speech:    Temporary loss of vision in one eye:     Problems with dizziness:         Gastrointestinal    Blood in stool:     Vomited blood:         Genitourinary      Burning when urinating:     Blood in urine:        Psychiatric    Major depression:         Hematologic    Bleeding problems:    Problems with blood clotting too easily:        Skin    Rashes or ulcers:        Constitutional    Fever or chills:      PHYSICAL EXAMINATION:  Vitals:   03/24/22 0951  BP: (!) 165/104  Pulse: 100  Temp: 98 F (36.7 C)  TempSrc: Temporal  SpO2: 97%  Weight: 220 lb (99.8 kg)  Height: 5' 11" (1.803 m)    General:  WDWN in NAD; vital signs documented above Gait: Not observed HENT: WNL, normocephalic Pulmonary: normal non-labored breathing , without Rales, rhonchi,  wheezing Cardiac: regular HR Abdomen: soft, NT, no masses Skin: without rashes Vascular Exam/Pulses: Palpable left femoral pulse palpable right femoral pulse absent right pedal pulses Extremities: Wound of the posterior right calf about 3 x 3 cm Musculoskeletal: no muscle wasting or atrophy  Neurologic: A&O X 3;  No focal weakness or paresthesias are detected Psychiatric:  The pt has Normal affect.   Non-Invasive Vascular Imaging:   Right lower extremity arterial duplex demonstrates triphasic waveform down to the level of the distal popliteal which then becomes monophasic.  He has severe occlusive disease of the tibial arteries  Unable to obtain right ABI due to location of the wound however he does have a toe pressure of 72 mmHg  ASSESSMENT/PLAN:: 66 y.o. male here for follow up for evaluation of new right leg wound with history of known PAD and venous insufficiency  -Patient presents with a slow to heal wound of his posterior right calf which has been being cared for by the wound clinic.  He has known arterial and venous  insufficiency.  He states the wound has been present for about 3 months.  Arterial duplex demonstrates triphasic flow to the distal popliteal artery which then becomes monophasic.  He also has evidence of severe occlusive tibial disease.  Given the extent of the wound and lack of progress recently we will repeat angiogram.  Previously he underwent atherectomy and balloon angioplasty of the TP trunk and posterior tibial artery.  We will repeat angiogram with Dr. Cain on the next available date.  Case including risks were discussed with the patient and he agrees to proceed.   Mella Inclan, PA-C [facility] [phone]  Clinic MD:   Cain  

## 2022-03-25 ENCOUNTER — Other Ambulatory Visit: Payer: Self-pay

## 2022-03-25 DIAGNOSIS — I70221 Atherosclerosis of native arteries of extremities with rest pain, right leg: Secondary | ICD-10-CM

## 2022-04-10 ENCOUNTER — Other Ambulatory Visit: Payer: Self-pay | Admitting: Internal Medicine

## 2022-04-12 ENCOUNTER — Encounter (HOSPITAL_COMMUNITY)
Admission: RE | Disposition: A | Discharge status: Home / Self Care | Payer: Self-pay | Source: Home / Self Care | Attending: Vascular Surgery

## 2022-04-12 ENCOUNTER — Ambulatory Visit (HOSPITAL_COMMUNITY)
Admission: RE | Admit: 2022-04-12 | Discharge: 2022-04-12 | Disposition: A | Discharge status: Home / Self Care | Payer: PPO | Attending: Vascular Surgery | Admitting: Vascular Surgery

## 2022-04-12 ENCOUNTER — Encounter (HOSPITAL_COMMUNITY): Payer: Self-pay | Admitting: Vascular Surgery

## 2022-04-12 DIAGNOSIS — I70232 Atherosclerosis of native arteries of right leg with ulceration of calf: Secondary | ICD-10-CM

## 2022-04-12 DIAGNOSIS — Z7984 Long term (current) use of oral hypoglycemic drugs: Secondary | ICD-10-CM | POA: Diagnosis not present

## 2022-04-12 DIAGNOSIS — N189 Chronic kidney disease, unspecified: Secondary | ICD-10-CM | POA: Insufficient documentation

## 2022-04-12 DIAGNOSIS — E1122 Type 2 diabetes mellitus with diabetic chronic kidney disease: Secondary | ICD-10-CM | POA: Diagnosis not present

## 2022-04-12 DIAGNOSIS — L97219 Non-pressure chronic ulcer of right calf with unspecified severity: Secondary | ICD-10-CM | POA: Diagnosis not present

## 2022-04-12 DIAGNOSIS — I129 Hypertensive chronic kidney disease with stage 1 through stage 4 chronic kidney disease, or unspecified chronic kidney disease: Secondary | ICD-10-CM | POA: Diagnosis not present

## 2022-04-12 DIAGNOSIS — E11622 Type 2 diabetes mellitus with other skin ulcer: Secondary | ICD-10-CM | POA: Diagnosis not present

## 2022-04-12 DIAGNOSIS — E1151 Type 2 diabetes mellitus with diabetic peripheral angiopathy without gangrene: Secondary | ICD-10-CM | POA: Insufficient documentation

## 2022-04-12 DIAGNOSIS — I70221 Atherosclerosis of native arteries of extremities with rest pain, right leg: Secondary | ICD-10-CM

## 2022-04-12 DIAGNOSIS — Z7985 Long-term (current) use of injectable non-insulin antidiabetic drugs: Secondary | ICD-10-CM | POA: Diagnosis not present

## 2022-04-12 HISTORY — PX: PERIPHERAL VASCULAR ATHERECTOMY: CATH118256

## 2022-04-12 HISTORY — PX: PERIPHERAL VASCULAR INTERVENTION: CATH118257

## 2022-04-12 HISTORY — PX: ABDOMINAL AORTOGRAM W/LOWER EXTREMITY: CATH118223

## 2022-04-12 LAB — POCT I-STAT, CHEM 8
BUN: 43 mg/dL — ABNORMAL HIGH (ref 8–23)
Calcium, Ion: 1.31 mmol/L (ref 1.15–1.40)
Chloride: 110 mmol/L (ref 98–111)
Creatinine, Ser: 1.9 mg/dL — ABNORMAL HIGH (ref 0.61–1.24)
Glucose, Bld: 140 mg/dL — ABNORMAL HIGH (ref 70–99)
HCT: 49 % (ref 39.0–52.0)
Hemoglobin: 16.7 g/dL (ref 13.0–17.0)
Potassium: 4.1 mmol/L (ref 3.5–5.1)
Sodium: 143 mmol/L (ref 135–145)
TCO2: 22 mmol/L (ref 22–32)

## 2022-04-12 LAB — GLUCOSE, CAPILLARY: Glucose-Capillary: 159 mg/dL — ABNORMAL HIGH (ref 70–99)

## 2022-04-12 SURGERY — ABDOMINAL AORTOGRAM W/LOWER EXTREMITY
Anesthesia: LOCAL | Laterality: Right

## 2022-04-12 MED ORDER — SODIUM CHLORIDE 0.9% FLUSH: 3.0000 mL | Freq: Two times a day (BID) | INTRAVENOUS | Status: DC

## 2022-04-12 MED ORDER — ACETAMINOPHEN 325 MG PO TABS
650.0000 mg | ORAL_TABLET | ORAL | Status: DC | PRN
  Administered 2022-04-12: 650 mg via ORAL
  Filled 2022-04-12: qty 2

## 2022-04-12 MED ORDER — CLOPIDOGREL BISULFATE 75 MG PO TABS: 75.0000 mg | ORAL_TABLET | Freq: Every day | ORAL | Status: DC

## 2022-04-12 MED ORDER — ONDANSETRON HCL 4 MG/2ML IJ SOLN: 4.0000 mg | Freq: Four times a day (QID) | INTRAMUSCULAR | Status: DC | PRN

## 2022-04-12 MED ORDER — FENTANYL CITRATE (PF) 100 MCG/2ML IJ SOLN
INTRAMUSCULAR | Status: AC
  Filled 2022-04-12: qty 2

## 2022-04-12 MED ORDER — MIDAZOLAM HCL 2 MG/2ML IJ SOLN
INTRAMUSCULAR | Status: DC | PRN
  Administered 2022-04-12 (×2): 1 mg via INTRAVENOUS

## 2022-04-12 MED ORDER — MORPHINE SULFATE (PF) 2 MG/ML IV SOLN: 2.0000 mg | INTRAVENOUS | Status: DC | PRN

## 2022-04-12 MED ORDER — SODIUM CHLORIDE 0.9 % IV SOLN: 250.0000 mL | INTRAVENOUS | Status: DC | PRN

## 2022-04-12 MED ORDER — HEPARIN SODIUM (PORCINE) 1000 UNIT/ML IJ SOLN
INTRAMUSCULAR | Status: AC
  Filled 2022-04-12: qty 10

## 2022-04-12 MED ORDER — CLOPIDOGREL BISULFATE 75 MG PO TABS: 75.0000 mg | ORAL_TABLET | Freq: Every day | ORAL | 11 refills | Status: AC

## 2022-04-12 MED ORDER — IODIXANOL 320 MG/ML IV SOLN
INTRAVENOUS | Status: DC | PRN
  Administered 2022-04-12: 40 mL

## 2022-04-12 MED ORDER — HYDRALAZINE HCL 20 MG/ML IJ SOLN: 5.0000 mg | INTRAMUSCULAR | Status: DC | PRN

## 2022-04-12 MED ORDER — SODIUM CHLORIDE 0.9 % IV SOLN: INTRAVENOUS | Status: DC

## 2022-04-12 MED ORDER — HEPARIN SODIUM (PORCINE) 1000 UNIT/ML IJ SOLN
INTRAMUSCULAR | Status: DC | PRN
  Administered 2022-04-12: 10000 [IU] via INTRAVENOUS

## 2022-04-12 MED ORDER — OXYCODONE HCL 5 MG PO TABS: 5.0000 mg | ORAL_TABLET | ORAL | Status: DC | PRN

## 2022-04-12 MED ORDER — FENTANYL CITRATE (PF) 100 MCG/2ML IJ SOLN
INTRAMUSCULAR | Status: DC | PRN
  Administered 2022-04-12 (×2): 50 ug via INTRAVENOUS

## 2022-04-12 MED ORDER — LABETALOL HCL 5 MG/ML IV SOLN
INTRAVENOUS | Status: DC | PRN
  Administered 2022-04-12: 10 mg via INTRAVENOUS

## 2022-04-12 MED ORDER — ASPIRIN 81 MG PO TBEC
81.0000 mg | DELAYED_RELEASE_TABLET | Freq: Every day | ORAL | Status: DC
  Administered 2022-04-12: 81 mg via ORAL
  Filled 2022-04-12: qty 1

## 2022-04-12 MED ORDER — CLOPIDOGREL BISULFATE 300 MG PO TABS
ORAL_TABLET | ORAL | Status: DC | PRN
  Administered 2022-04-12: 300 mg via ORAL

## 2022-04-12 MED ORDER — LIDOCAINE HCL (PF) 1 % IJ SOLN
INTRAMUSCULAR | Status: DC | PRN
  Administered 2022-04-12: 20 mL

## 2022-04-12 MED ORDER — CLOPIDOGREL BISULFATE 75 MG PO TABS: 300.0000 mg | ORAL_TABLET | Freq: Once | ORAL | Status: DC

## 2022-04-12 MED ORDER — SODIUM CHLORIDE 0.9% FLUSH: 3.0000 mL | INTRAVENOUS | Status: DC | PRN

## 2022-04-12 MED ORDER — MIDAZOLAM HCL 2 MG/2ML IJ SOLN
INTRAMUSCULAR | Status: AC
  Filled 2022-04-12: qty 2

## 2022-04-12 MED ORDER — HEPARIN (PORCINE) IN NACL 1000-0.9 UT/500ML-% IV SOLN
INTRAVENOUS | Status: DC | PRN
  Administered 2022-04-12 (×2): 500 mL

## 2022-04-12 MED ORDER — LIDOCAINE HCL (PF) 1 % IJ SOLN
INTRAMUSCULAR | Status: AC
  Filled 2022-04-12: qty 30

## 2022-04-12 MED ORDER — CLOPIDOGREL BISULFATE 300 MG PO TABS
ORAL_TABLET | ORAL | Status: AC
  Filled 2022-04-12: qty 1

## 2022-04-12 MED ORDER — SODIUM CHLORIDE 0.9 % WEIGHT BASED INFUSION: 1.0000 mL/kg/h | INTRAVENOUS | Status: DC

## 2022-04-12 MED ORDER — LABETALOL HCL 5 MG/ML IV SOLN
INTRAVENOUS | Status: AC
  Filled 2022-04-12: qty 4

## 2022-04-12 MED ORDER — LABETALOL HCL 5 MG/ML IV SOLN
10.0000 mg | INTRAVENOUS | Status: DC | PRN
  Administered 2022-04-12: 10 mg via INTRAVENOUS
  Filled 2022-04-12: qty 4

## 2022-04-12 SURGICAL SUPPLY — 23 items
BALLN STERLING OTW 3X220X150 (BALLOONS) ×3
BALLOON STERLING OTW 3X220X150 (BALLOONS) IMPLANT
CATH AURYON ATHERECTOMY 1.5 (CATHETERS) IMPLANT
CATH OMNI FLUSH 5F 65CM (CATHETERS) IMPLANT
CATH RUBICON 018 135 (CATHETERS) IMPLANT
CATH TEMPO AQUA 5F 100CM (CATHETERS) IMPLANT
CLOSURE MYNX CONTROL 6F/7F (Vascular Products) IMPLANT
KIT ANGIASSIST CO2 SYSTEM (KITS) IMPLANT
KIT ENCORE 26 ADVANTAGE (KITS) IMPLANT
KIT MICROPUNCTURE NIT STIFF (SHEATH) IMPLANT
KIT PV (KITS) ×3 IMPLANT
SHEATH CATAPULT 6FR 60 (SHEATH) IMPLANT
SHEATH PINNACLE 5F 10CM (SHEATH) IMPLANT
SHEATH PINNACLE 6F 10CM (SHEATH) IMPLANT
SHEATH PROBE COVER 6X72 (BAG) IMPLANT
STOPCOCK MORSE 400PSI 3WAY (MISCELLANEOUS) IMPLANT
SYR MEDRAD MARK 7 150ML (SYRINGE) ×3 IMPLANT
TRANSDUCER W/STOPCOCK (MISCELLANEOUS) ×3 IMPLANT
TRAY PV CATH (CUSTOM PROCEDURE TRAY) ×3 IMPLANT
TUBING CIL FLEX 10 FLL-RA (TUBING) IMPLANT
WIRE SHEPHERD 6G .018 (WIRE) IMPLANT
WIRE SPARTACORE .014X300CM (WIRE) IMPLANT
WIRE STARTER BENTSON 035X150 (WIRE) IMPLANT

## 2022-04-12 NOTE — Op Note (Signed)
Patient name: [name] [name] MRN: [id number] DOB: 1955/03/16 Sex: male  04/12/2022 Pre-operative Diagnosis: Chronic right lower extremity limb threatening ischemia with Ulceration Post-operative diagnosis:  Same Surgeon:  Apolinar Junes C. Randie Heinz, MD Procedure Performed: 1.  Ultrasound-guided cannulation left common femoral artery 2.  CO2 aortogram and right lower extremity angiogram 3.  Laser arthrectomy right tibioperoneal trunk and posterior tibial artery with 1.5 mm Auryon 4.  Plain balloon angioplasty right posterior tibial and tibioperoneal trunk with 3 mm balloon 5.  Mynx device closure left common femoral artery 6.  Moderate sedation with fentanyl and Versed for 52 minutes  Indications: 67 year old male has a history of intervention of the left lower extremity to heal up pretibial wound now has a right wound over his Achilles which she has been followed in the wound care center.  He has previously undergone venous reflux testing did not demonstrate any significant reflux in his great saphenous vein.  He does have ABIs which were monophasic and unable to tolerate after compression although his toe pressure was maintained at 72 mmHg and which he is indicated for angiography with possible invention.  Findings: Aorta and iliac segments are free of flow-limiting stenosis.  Right common femoral artery has a palpable pulse no flow-limiting stenosis in the profunda fills briskly.  The SFA and popliteal arteries are calcified however there is no flow-limiting stenosis.  Below the knee there is frank occlusion of the anterior tibial and no recognizable anterior tibial distally.  The tibioperoneal trunk is severely diseased with at least 60% stenosis in the posterior tibial artery is subtotally occluded and then reconstitutes distally which is a very large vessel and fills the foot.  The peroneal artery initially has a takeoff but then long segment occlusion.  After intervention of the left posterior tibial  artery where it was subtotally occluded now with 0% residual stenosis.  At completion there is a very strong posterior tibial signal likely traced out to the foot distally.   Procedure:  The patient was identified in the holding area and taken to room 8.  The patient was then placed supine on the table and prepped and draped in the usual sterile fashion.  A time out was called.  Ultrasound was used to evaluate the left common femoral artery which was patent and free of disease.  The area was anesthetized with 1% lidocaine cannulated with a micropuncture needle followed by wire and sheath.  And images saved to the permanent record.  We placed a Bentson wire followed by a 5 French sheath and placed an Omni catheter to the level of L1 performed CO2 aortogram given the patient's elevated creatinine.  We then crossed the bifurcation perform right lower extremity angiography with CO2 and then placed a long straight catheter to the level of the knee and performed contrasted angiography below the knee.  With the above findings we exchanged for a long 6 French sheath and patient was given 10,000 of heparin.  The V18 wire and Rubicon catheter were then used to cross the long segment stenosis of the tibioperoneal trunk with PT artery and confirmed intraluminal access.  We then exchanged for an 014 wire perform laser arthrectomy of the tibioperoneal trunk and PT artery with 1.5 mm laser and then perform balloon angioplasty with a 3 mm balloon.  Completion demonstrated brisk flow throughout the PT.  No strong signals are likely to be traced on the foot.  We placed a Bentson wire and then exchanged for a short 6  French sheath put a minx device.  Patient tolerated procedure without any complication.   Contrast: 40 cc   Mychael Soots C. Randie Heinz, MD [facility] Office: [phone] Pager: [phone]

## 2022-04-12 NOTE — Interval H&P Note (Signed)
History and Physical Interval Note:  04/12/2022 7:10 AM  [name] [name]  has presented today for surgery, with the diagnosis of critical limb ischemia of right lower extremity.  The various methods of treatment have been discussed with the patient and family. After consideration of risks, benefits and other options for treatment, the patient has consented to  Procedure(s): ABDOMINAL AORTOGRAM W/LOWER EXTREMITY (N/A) as a surgical intervention.  The patient's history has been reviewed, patient examined, no change in status, stable for surgery.  I have reviewed the patient's chart and labs.  Questions were answered to the patient's satisfaction.     [name]

## 2022-04-12 NOTE — Progress Notes (Signed)
Dr. Randie Heinz in to see pt. Made aware of elevated blood pressure to spite taking all prescribed blood pressure medications at 0415 today. No new orders received. Per him okay to take patient over for aortogram and will treat during procedure.

## 2022-04-13 ENCOUNTER — Encounter (HOSPITAL_COMMUNITY): Payer: Self-pay | Admitting: Vascular Surgery

## 2022-04-14 ENCOUNTER — Telehealth: Payer: Self-pay | Admitting: Vascular Surgery

## 2022-04-14 NOTE — Telephone Encounter (Signed)
-----   Message from Maeola Harman, MD sent at 04/12/2022  9:03 AM EDT ----- [name] [name] [id number] [birth year]   04/12/2022 Pre-operative Diagnosis: Chronic right lower extremity limb threatening ischemia with Ulceration Post-operative diagnosis:  Same Surgeon:  Apolinar Junes C. Randie Heinz, MD Procedure Performed: 1.  Ultrasound-guided cannulation left common femoral artery 2.  CO2 aortogram and right lower extremity angiogram 3.  Laser arthrectomy right tibioperoneal trunk and posterior tibial artery with 1.5 mm Auryon 4.  Plain balloon angioplasty right posterior tibial and tibioperoneal trunk with 3 mm balloon 5.  Mynx device closure left common femoral artery 6.  Moderate sedation with fentanyl and Versed for 52 minutes  F/u in 4-6 weeks with pa or me with right lower extremity arterial duplex and ABI.  Apolinar Junes

## 2022-04-16 ENCOUNTER — Ambulatory Visit: Payer: PPO | Admitting: Internal Medicine

## 2022-04-16 ENCOUNTER — Ambulatory Visit: Payer: PPO | Admitting: Podiatry

## 2022-04-16 VITALS — BP 154/89 | HR 68 | Temp 97.8°F | Resp 18 | Ht 68.0 in | Wt 219.5 lb

## 2022-04-16 DIAGNOSIS — I739 Peripheral vascular disease, unspecified: Secondary | ICD-10-CM | POA: Diagnosis not present

## 2022-04-16 DIAGNOSIS — S81801D Unspecified open wound, right lower leg, subsequent encounter: Secondary | ICD-10-CM

## 2022-04-16 DIAGNOSIS — M779 Enthesopathy, unspecified: Secondary | ICD-10-CM | POA: Diagnosis not present

## 2022-04-16 DIAGNOSIS — I10 Essential (primary) hypertension: Secondary | ICD-10-CM | POA: Diagnosis not present

## 2022-04-16 DIAGNOSIS — E1142 Type 2 diabetes mellitus with diabetic polyneuropathy: Secondary | ICD-10-CM | POA: Diagnosis not present

## 2022-04-16 DIAGNOSIS — E1122 Type 2 diabetes mellitus with diabetic chronic kidney disease: Secondary | ICD-10-CM

## 2022-04-16 DIAGNOSIS — M19071 Primary osteoarthritis, right ankle and foot: Secondary | ICD-10-CM

## 2022-04-16 DIAGNOSIS — M25371 Other instability, right ankle: Secondary | ICD-10-CM | POA: Diagnosis not present

## 2022-04-16 DIAGNOSIS — N1832 Chronic kidney disease, stage 3b: Secondary | ICD-10-CM | POA: Diagnosis not present

## 2022-04-16 DIAGNOSIS — S81801A Unspecified open wound, right lower leg, initial encounter: Secondary | ICD-10-CM | POA: Insufficient documentation

## 2022-04-16 MED ORDER — GABAPENTIN 300 MG PO CAPS: 300.0000 mg | ORAL_CAPSULE | Freq: Every day | ORAL | 2 refills | Status: DC

## 2022-04-16 MED ORDER — OXYCODONE-ACETAMINOPHEN 10-325 MG PO TABS: 1.0000 | ORAL_TABLET | Freq: Three times a day (TID) | ORAL | 0 refills | Status: DC | PRN

## 2022-04-16 MED ORDER — INSULIN GLARGINE 100 UNITS/ML SOLOSTAR PEN: 10.0000 [IU] | PEN_INJECTOR | Freq: Every day | SUBCUTANEOUS | 11 refills | Status: DC

## 2022-04-16 MED ORDER — LOSARTAN POTASSIUM 50 MG PO TABS: 50.0000 mg | ORAL_TABLET | Freq: Every day | ORAL | 3 refills | Status: DC

## 2022-04-16 MED ORDER — METHYLPREDNISOLONE 4 MG PO TBPK
ORAL_TABLET | ORAL | 0 refills | Status: DC
Start: 2022-04-16 — End: 2022-05-12

## 2022-04-16 NOTE — Assessment & Plan Note (Addendum)
Not well controlled so will stop lisinopril and start losartan 50 mg daily.

## 2022-04-16 NOTE — Assessment & Plan Note (Signed)
He will follow with vascular surgeon

## 2022-04-16 NOTE — Assessment & Plan Note (Signed)
I will start lantus 10 units and he will keep increasing 3 units every 3rd day for fasting sugar greater than 150.

## 2022-04-16 NOTE — Progress Notes (Signed)
Subjective:  Patient ID: [name] [name], male    DOB: [birth year],  MRN: [id number]  Chief Complaint  Patient presents with   Ankle Pain    Right foot, pain started a couple weeks ago, patient stated he wasn't doing anything different than his normal activities     67 y.o. male presents to office for follow-up evaluation of right ankle pain.  Patient presents for follow-up of right ankle pain and concern for possible instability.  He reports he is still having these issues.  He was given a steroid injection last fall did not really help much he thinks.  Past Medical History:  Diagnosis Date   Cellulitis    Chronic kidney disease    CVA (cerebral vascular accident) 2014   Diabetes mellitus without complication    Gout    Hypertension    Obesity (BMI 30.0-34.9)    Sleep disorder     Allergies  Allergen Reactions   Ibuprofen     Other reaction(s): Other (See Comments) Contraindicated by his CKD    ROS: Negative except as per HPI above  Objective:  General: AAO x3, NAD  Dermatological: With inspection and palpation of the right and left lower extremities there are no open sores, no preulcerative lesions, no rash or signs of infection present. Nails are of normal length thickness and coloration.   Vascular:  Dorsalis Pedis artery and Posterior Tibial artery pedal pulses are 2/4 bilateral.  Capillary fill time brisk < 3 sec. Pedal hair growth present. No varicosities and no lower extremity edema present bilateral. There is no pain with calf compression, swelling, warmth, erythema.   Neruologic: Grossly intact via light touch bilateral. Protective threshold intact to all sites bilateral. Patellar and Achilles deep tendon reflexes 2+ bilateral. Negative Babinski reflex.   Musculoskeletal: Mild edema of the right ankle. Pain with palpation of the anterior ankle joint.   Gait: Unassisted, Nonantalgic.   No images are attached to the encounter.  Radiographs:  Date: September 29, 2021 XR right ankle weightbearing AP/Lateral/Oblique   Findings: Degenerative changes noted about the right ankle joint with diminished joint space joint irregularity osteosclerosis and cyst formation. Assessment:   1. Arthritis of right ankle   2. Ankle instability, right   3. Tendonitis   4. Diabetic polyneuropathy associated with type 2 diabetes mellitus       Plan:  Patient was evaluated and treated and all questions answered.  #Osteoarthritis of right ankle, chronic ankle pain #Right ankle instability -Discussed with the patient that he does have evidence of pain and instability in the right ankle -Would like to proceed with a course of physical therapy for 6 weeks once weekly.  Will send referral to [facility] health physical therapy near our office in [place]. -Focus for physical therapy will be for strengthening range of motion exercises in the right ankle.   -Patient denies feelings of pain says the issue is more with instability in the ankle giving out -He does have some occasional pain however I do want to try a steroid pack E Rx for methylprednisolone 4 mg steroid taper pack take as directed for 6 days. -Also recommend the patient try using ankle brace which she says at home and I believe this will help with instability concerns.  Return in about 6 weeks (around 05/28/2022) for Follow-up right ankle pain and instability.          Corinna Gab, DPM [facility] / [facility]

## 2022-04-16 NOTE — Progress Notes (Signed)
Office Visit  Subjective   Patient ID: [name] [name]   DOB: [birth year]   Age: 67 y.o.   MRN: [id number]   Chief Complaint Chief Complaint  Patient presents with   office visit    Pain in right leg      History of Present Illness The patient is a 67 year old Caucasian/White male who presents with follow up. He says that he can not sleep at night due to severe pain in his right lower leg around his wound and wound is slightly getting better. He has PAD s/p balloon angioplasty in 2022. He has aortogram last week where his blood pressure was high. His blood pressure is high again today. He goes to wound center. that he developed wound on both lower legs and the side. He says that he is barely able to walk. Tramadol was not helping so I have given him percocet on last visit, patient says it help with his pain. He says that he ran out of percocet. He has peripheral arterial disease and follows with vascular surgeon in [place].   He has diabetes and his last HbA1c was 9.4 but for the last 2 weeks his sugar been very high on CGM, he take glimeperide and was suppose to take trulicity 3 mg q weekly. His HbA1c was 9.6% in 1/24. But his renal function got further worse with gfr was 40 in 01/13/22.    Marland Kitchen         Past Medical History Past Medical History:  Diagnosis Date   Cellulitis    Chronic kidney disease    CVA (cerebral vascular accident) 2014   Diabetes mellitus without complication    Gout    Hypertension    Obesity (BMI 30.0-34.9)    Sleep disorder      Allergies Allergies  Allergen Reactions   Ibuprofen     Other reaction(s): Other (See Comments) Contraindicated by his CKD     Review of Systems Review of Systems  Constitutional: Negative.   HENT: Negative.    Cardiovascular: Negative.   Gastrointestinal: Negative.   Musculoskeletal:        Right leg pain  Skin: Negative.   Neurological: Negative.        Objective:    Vitals BP (!) 154/89 (BP Location: Left  Arm, Patient Position: Sitting, Cuff Size: Normal)   Pulse 68   Temp 97.8 F (36.6 C)   Resp 18   Ht 5\' 8"  (1.727 m)   Wt 219 lb 8 oz (99.6 kg)   SpO2 98%   BMI 33.37 kg/m    Physical Examination Physical Exam Constitutional:      Appearance: Normal appearance. He is obese.  HENT:     Head: Normocephalic and atraumatic.  Cardiovascular:     Rate and Rhythm: Normal rate and regular rhythm.     Heart sounds: Normal heart sounds.  Pulmonary:     Effort: Pulmonary effort is normal.     Breath sounds: Normal breath sounds.  Abdominal:     General: Bowel sounds are normal.     Palpations: Abdomen is soft.  Neurological:     General: No focal deficit present.     Mental Status: He is alert.        Assessment & Plan:   Benign essential HTN Not well controlled so will stop lisinopril and start losartan 50 mg daily.  Type 2 diabetes mellitus with diabetic chronic kidney disease (HCC) I will start lantus 10 units  and he will keep increasing 3 units every 3rd day for fasting sugar greater than 150.  PAD (peripheral artery disease) (HCC) He will follow with vascular surgeon  Wound of right leg He leg pain is not controlled, that is increasing his blood pressure and sugar. I will stat gabapentin and change percocet to three time a day.    Return in about 1 month (around 05/16/2022).   Eloisa Northern, MD

## 2022-04-16 NOTE — Assessment & Plan Note (Signed)
He leg pain is not controlled, that is increasing his blood pressure and sugar. I will stat gabapentin and change percocet to three time a day.

## 2022-04-19 ENCOUNTER — Ambulatory Visit: Payer: PPO

## 2022-04-26 ENCOUNTER — Telehealth: Payer: Self-pay

## 2022-04-26 NOTE — Progress Notes (Signed)
CMCS Notes  Scheduling: Sherlene Shams Conservation officer, historic buildings) spoke with pt or pt representative on 04/21/22. They scheduled pt's initial CMCS office visit on 04/30/22.  Start: .04/26/22, 10:01 AM. Reviewed charts/consult notes/labs/vitals/medications/documents/TEs to complete chart prep for pt's initial CMCS OV. Used [employer] and [employer]. Pt is scheduled to see [name], PharmD on 04/30/22. Total time: 53 minutes (non-billable time). Anne Ng, LPN.

## 2022-04-30 ENCOUNTER — Ambulatory Visit: Payer: PPO

## 2022-04-30 NOTE — Progress Notes (Signed)
Initial Pharmacist Visit (CMCS)  [name],[name]  66 years, Male  DOB: February 07, 1955  M:  Clinical Summary Next Pharmacist Follow Up: FPO in 2 weeks  Next AWV: Not scheduled  Summary for PCP:  - Patient needs clarification if he should be taking Amlodipine for BP. His BP was elevated at visit today; however, he said his home readings are lower. Will call him in 2 weeks for home BP readings. - Educated the patient that lisinopril was stopped and he is to take Losartan going forward. - He has not started Lantus. [facility] pharmacy and found out that they did not receive the Rx. Gave them a verbal. He will start using it from this week. - He is due for updated Lipid panel and Urine Microalbumin test at next visit. Physician Referral Statement:: I reaffirm my previous referral of patient for CMCS services  . Patient scheduled for CMCS visit with the clinical pharmacist: Patient is referred for [facility] by their PCP and Clinical Pharmacist is under general PCP supervision. Visit Type: Clinic visit Date of Upcoming Visit: 04/30/2022 . Patient's Chronic Conditions: Diabetes (DM), Other, Hypertension (HTN), Hyperlipidemia/Dyslipidemia (HLD), Chronic Kidney Disease (CKD), Gout List Other Conditions (separated by comma): PAD . Disease Assessments Visit Date Visit Completed on: 04/30/2022 Subjective Information Did patient bring medications to appointment?: Yes Subjective: Concerned about too many meds, want to come off of some, prefers supplements.  He is disabled, has 4 small dogs that keep him busy. Takes day time naps, house work and occasional outside work. Every other weekend, they go [place]. Married for 15yrs. Both have children from prev marriages. 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 He has couple of strokes - 1 major. He believes that because of ot taking medicine. Stroke affected R side -  with PT most of his function is back, struggles with handwriting, typing, mood and attitude some(according to wife) Lifestyle habits: Diet: Takes BF for wife . he eats oatmeal, eggs/omelette. He drinks flavoured water, no coffee, sometimes drnks tea. Lunch is leftovers from supper, salad, sandwich, soup. Dinner is more structured - they are signed up for "healthy root" , they have planned meals. Otherwise chicken - grilled/baked. Some vegetables every meal. Snacks are grapes, pineapple, hard boiled eggs, fruit bars.  Ex[facility]: [facility]. He does sit down bike, upper body strength. He has 2 health coaches with insurance, One is PT type, other keeps up with meals, BP and BG. on Bike and 2 reps of strength  No smoking/ alcohol or drug usage.  Sleep : Rough,Is in counseling. Dr.Amin prescribed gabapentin and that has positive impact on sleeping. He wakes couple ties a night, but sleeps back. What is the patient's sleep pattern?: Trouble falling asleep How many hours per night does patient typically sleep?: 7-8 . SDOH: Accountable Health Communities Health-Related Social Needs Screening Tool ([employer]se) SDOH questions completed during initial visit?: Yes What is your living situation today? (ref #1): I have a steady place to live Think about the place you live. Do you have problems with any of the following? (ref #2): None of the above Within the past 12 months, you worried that your food would run out before you got money to buy more (ref #3): Never true Within the past 12 months, the food you bought just didn't last and you didn't have money to get more (ref #4):  Never true In the past 12 months, has lack of reliable transportation kept you from medical appointments, meetings, work or from getting things needed for daily living? (ref #5): No In the past 12 months, has the electric, gas, oil, or water company  threatened to shut off services in your home? (ref #6): No How often does anyone, including family and friends, physically hurt you? (ref #7): Never (1) How often does anyone, including family and friends, insult or talk down to you? (ref #8): Never (1) How often does anyone, including friends and family, threaten you with harm? (ref #9): Never (1) How often does anyone, including family and friends, scream or curse at you? (ref #10): Never (1) Medication Adherence Does the [facility] have access to medication refill history?: No Name and location of Current pharmacy[facility] Current Rx insurance plan: HTA Are meds synced by current pharmacy?: No Are meds delivered by current pharmacy?: No - delivery not available Assessment:: Potentially Non-adherent . Hypertension (HTN) Most Recent BP: 154/89 Most Recent HR: 68 taken on: 04/16/2022 Care Gap: Need BP documented or last BP 140/90 or higher: Needs to be addressed Assessed today?: Yes BP today is: 158/84 Goal: <130/80 mmHG Is Patient checking BP at home?: Yes Patient home BP readings are ranging: 130/80 Has patient experienced hypotension, dizziness, falls or bradycardia?: No Patient has tried and failed: obesity We discussed: DASH diet:  following a diet emphasizing fruits and vegetables and low-fat dairy products along with whole grains, fish, poultry, and nuts. Reducing red meats and sugars., Increasing exercise (walking, biking, swimming) to a goal of 30 minutes per day, as able based on current activity level and health or as directed by your healthcare provider., Contacting PCP office for signs and symptoms of high or low blood pressure (hypotension, dizziness, falls, headaches, edema) Assessment:: Uncontrolled Drug: Losartan 50mg  QD. Pharmacist Assessment: Appropriate, Query Effectiveness Drug: Metoprolol Tartrate 75mg  QD. Pharmacist Assessment: Appropriate, Query Effectiveness Drug: amlodipine 5mg  Daily Pharmacist Assessment:  Appropriate, Query Effectiveness Plan/Follow up: Check is amlodipine is to be taken call [facility] and ask them to stop lisinopril , losartan for 90 days 2 weeks for BP reading . Hyperlipidemia/Dyslipidemia (HLD) Last Lipid panel on: 10/19/2021 TC (Goal<200): 220 LDL: 112 HDL (Goal>40): 41 TG (Goal<150): 388 ASCVD 10-year risk?is:: N/A due to existing ASCVD Assessed today?: No Drug: Repatha SureClick 140mg /mL 140mg  q2 weeks. Plan/Follow up: Repeat Lipid panel next week. . Diabetes (DM) Most recent A1C: 9.3 taken on: 01/13/2022 Previous A1C: 8.7 taken on: 10/19/2021 Most Recent GFR: 40 taken on: 01/13/2022 Type: 2 Most recent microalbumin ratio: No recent labs. Care Gap: Statin therapy needed: Addressed Care Gap: Need A1c documented or last A1c > 9 %: Needs to be addressed Care Gap: Need eye exam documented in EMR or by claim: Addressed Care Gap: Need eGFR and uACR for kidney health evaluation: Needs to be addressed Assessed today?: Yes Goal A1C: < 7.0 % Type: 2 Is Patient taking statin medication?: Yes Is patient taking ACEi / ARB?: Yes Blood glucose monitoring: CGM CGM Type: Freestyle Libre We discussed: Increasing exercise (walking, biking, swimming) to a goal of 30 minutes per day, as able based on current activity level and health Drug: Glimepiride 2mg  QD. Pharmacist Assessment: Appropriate, Query Effectiveness Drug: Jardiance 25mg  QD. Pharmacist Assessment: Appropriate, Query Effectiveness Drug: Lantus 10U QD. Pharmacist Assessment: Appropriate, Query Effectiveness Drug: Trulicity 3mg /0.66mL 3mg  once weekly. Pharmacist Assessment: Appropriate, Query Effectiveness Plan/Follow up: need MACR Does not have Lantus filled yet . Gout Most recent uric  acid: No recent labs. Assessed today?: No Drug: Allopurinol 300mg  QD. Drug: Colchicine 0.6mg  QD PRN. Chronic kidney disease (CKD) Most Recent GFR: 40 taken on: 01/13/2022 Previous GFR: 46 taken on: 07/10/2021 Most  recent microalbumin ratio: No recent labs. Assessed today?: No . Preventative Health Care Gap: Colorectal cancer screening: Needs to be addressed Care Gap: Breast cancer screening: Patient excluded from population (Age > 75, hx of bilateral mastectomy, frailty, hospice services) Care Gap: Annual Wellness Visit (AWV): Addressed Pharmacist Interventions Intervention Details Pharmacist Interventions discussed: Yes Discontinued Therapy: Duplicate drug therapy Monitoring: Routine monitoring, Overdue labs . [name], pharmd Chart review  Office visit 90 mins  Documentation 

## 2022-05-05 ENCOUNTER — Other Ambulatory Visit: Payer: Self-pay | Admitting: *Deleted

## 2022-05-05 DIAGNOSIS — I872 Venous insufficiency (chronic) (peripheral): Secondary | ICD-10-CM

## 2022-05-05 DIAGNOSIS — I739 Peripheral vascular disease, unspecified: Secondary | ICD-10-CM

## 2022-05-05 DIAGNOSIS — I70221 Atherosclerosis of native arteries of extremities with rest pain, right leg: Secondary | ICD-10-CM

## 2022-05-07 ENCOUNTER — Ambulatory Visit: Payer: PPO | Admitting: Internal Medicine

## 2022-05-10 ENCOUNTER — Other Ambulatory Visit: Payer: Self-pay | Admitting: Internal Medicine

## 2022-05-12 ENCOUNTER — Ambulatory Visit (INDEPENDENT_AMBULATORY_CARE_PROVIDER_SITE_OTHER)
Admission: RE | Admit: 2022-05-12 | Discharge: 2022-05-12 | Disposition: A | Discharge status: Home / Self Care | Payer: PPO | Source: Ambulatory Visit | Attending: Vascular Surgery | Admitting: Vascular Surgery

## 2022-05-12 ENCOUNTER — Ambulatory Visit (HOSPITAL_COMMUNITY)
Admission: RE | Admit: 2022-05-12 | Discharge: 2022-05-12 | Disposition: A | Discharge status: Home / Self Care | Payer: PPO | Source: Ambulatory Visit | Attending: Vascular Surgery | Admitting: Vascular Surgery

## 2022-05-12 ENCOUNTER — Ambulatory Visit (INDEPENDENT_AMBULATORY_CARE_PROVIDER_SITE_OTHER): Payer: PPO | Admitting: Physician Assistant

## 2022-05-12 VITALS — BP 172/94 | HR 87 | Temp 97.6°F | Wt 218.0 lb

## 2022-05-12 DIAGNOSIS — I739 Peripheral vascular disease, unspecified: Secondary | ICD-10-CM | POA: Diagnosis not present

## 2022-05-12 DIAGNOSIS — I872 Venous insufficiency (chronic) (peripheral): Secondary | ICD-10-CM | POA: Diagnosis not present

## 2022-05-12 DIAGNOSIS — I70221 Atherosclerosis of native arteries of extremities with rest pain, right leg: Secondary | ICD-10-CM | POA: Insufficient documentation

## 2022-05-12 LAB — VAS US ABI WITH/WO TBI
Left ABI: 0.55
Right ABI: UNDETERMINED

## 2022-05-12 NOTE — Progress Notes (Unsigned)
[facility] [place] HISTORY AND PHYSICAL   History of Present Illness:  Patient is a 67 y.o. year old male who presents for evaluation of PAD.  He has a history of mixed PAD with venous insufficieny.    He has a history of previous wounds and underwent  atherectomy and balloon angioplasty of the right PTA and TP trunk by Dr. Randie Heinz on 10/13/2020 due to critical right lower extremity ischemia with leg ulceration.  At his f/u visit He was seen on 03/24/22 with non healing wounds.  He was taken back to endovascular lab for CO2 aortogram and right lower extremity angiogram due to CKD with Laser arthrectomy right tibioperoneal trunk and posterior tibial artery with 1.5 mm Auryon and  Plain balloon angioplasty right posterior tibial and tibioperoneal trunk with 3 mm balloon.    He is here today for f/u.  He continues to be followed by Dr. Denman George DPM and wound center.  Currently the right LE is managed with una boots.     Past Medical History:  Diagnosis Date   Cellulitis    Chronic kidney disease    CVA (cerebral vascular accident) (HCC) 2014   Diabetes mellitus without complication (HCC)    Gout    Hypertension    Obesity (BMI 30.0-34.9)    Sleep disorder     Past Surgical History:  Procedure Laterality Date   ABDOMINAL AORTOGRAM W/LOWER EXTREMITY N/A 10/13/2020   Procedure: ABDOMINAL AORTOGRAM W/LOWER EXTREMITY;  Surgeon: Maeola Harman, MD;  Location: [facility] INVASIVE CV LAB;  Service: Cardiovascular;  Laterality: N/A;   ABDOMINAL AORTOGRAM W/LOWER EXTREMITY N/A 04/12/2022   Procedure: ABDOMINAL AORTOGRAM W/LOWER EXTREMITY;  Surgeon: Maeola Harman, MD;  Location: [facility] INVASIVE CV LAB;  Service: Cardiovascular;  Laterality: N/A;   Arthroscopic knee surgery     PERIPHERAL VASCULAR ATHERECTOMY  10/13/2020   Procedure: PERIPHERAL VASCULAR ATHERECTOMY;  Surgeon: Maeola Harman, MD;  Location: [facility] INVASIVE CV LAB;  Service: Cardiovascular;;  posterior  tibial artery   PERIPHERAL VASCULAR ATHERECTOMY Right 04/12/2022   Procedure: PERIPHERAL VASCULAR ATHERECTOMY;  Surgeon: Maeola Harman, MD;  Location: [facility] INVASIVE CV LAB;  Service: Cardiovascular;  Laterality: Right;   PERIPHERAL VASCULAR BALLOON ANGIOPLASTY  10/13/2020   Procedure: PERIPHERAL VASCULAR BALLOON ANGIOPLASTY;  Surgeon: Maeola Harman, MD;  Location: [facility] INVASIVE CV LAB;  Service: Cardiovascular;;  posterior tibial artery   PERIPHERAL VASCULAR INTERVENTION Right 03/11/2017   Procedure: PERIPHERAL VASCULAR INTERVENTION;  Surgeon: Sherren Kerns, MD;  Location: [facility] INVASIVE CV LAB;  Service: Cardiovascular;  Laterality: Right;  right renal artery stent   PERIPHERAL VASCULAR INTERVENTION  04/12/2022   Procedure: PERIPHERAL VASCULAR INTERVENTION;  Surgeon: Maeola Harman, MD;  Location: [facility] INVAS[facility] CV LAB;  Service: Cardiovascular;;   RENAL ANGIOGRAPHY N/A 03/11/2017   Procedure: RENAL ANGIOGRAPHY;  Surgeon: Sherren Kerns, MD;  Location: [facility] INVASIVE CV LAB;  Service: Cardiovascular;  Laterality: N/A;   TONSILLECTOMY      ROS:   General:  No weight loss, Fever, chills  HEENT: No recent headaches, no nasal bleeding, no visual changes, no sore throat  Neurologic: No dizziness, blackouts, seizures. No recent symptoms of stroke or mini- stroke. No recent episodes of slurred speech, or temporary blindness.  Cardiac: No recent episodes of chest pain/pressure, no shortness of breath at rest.  No shortness of breath with exertion.  Denies history of atrial fibrillation or irregular heartbeat  Vascular: No history of rest pain in feet.  No history  of claudication.  No history of non-healing ulcer, No history of DVT   Pulmonary: No home oxygen, no productive cough, no hemoptysis,  No asthma or wheezing  Musculoskeletal:  [ ]  Arthritis, [ ]  Low back pain,  [ ]  Joint pain  Hematologic:No history of hypercoagulable state.  No history of easy bleeding.  No  history of anemia  Gastrointestinal: No hematochezia or melena,  No gastroesophageal reflux, no trouble swallowing  Urinary: [ ]  chronic Kidney disease, [ ]  on HD - [ ]  MWF or [ ]  TTHS, [ ]  Burning with urination, [ ]  Frequent urination, [ ]  Difficulty urinating;   Skin: No rashes  Psychological: No history of anxiety,  No history of depression  Social History Social History   Tobacco Use   Smoking status: Never   Smokeless tobacco: Never  Vaping Use   Vaping Use: Never used  Substance Use Topics   Alcohol use: Not Currently   Drug use: No    Family History Family History  Problem Relation Age of Onset   Hypertension Mother    Hypertension Father    Colon cancer Paternal Grandmother     Allergies  Allergies  Allergen Reactions   Ibuprofen     Other reaction(s): Other (See Comments) Contraindicated by his CKD     Current Outpatient Medications  Medication Sig Dispense Refill   allopurinol (ZYLOPRIM) 300 MG tablet Take 1 tablet (300 mg total) by mouth daily. 30 tablet 11   amLODipine (NORVASC) 5 MG tablet Take 5 mg by mouth daily.     Cholecalciferol (VITAMIN D3) 125 MCG (5000 UT) capsule Take 5,000 Units by mouth daily.     clopidogrel (PLAVIX) 75 MG tablet Take 1 tablet (75 mg total) by mouth daily. 30 tablet 11   colchicine 0.6 MG tablet Take 1 tablet (0.6 mg total) by mouth daily as needed (gout). (Patient taking differently: Take 0.6 mg by mouth daily.) 30 tablet 2   Continuous Blood Gluc Receiver (FREESTYLE LIBRE 3 READER) DEVI 1 each by Does not apply route every 14 (fourteen) days. 2 each 6   Continuous Blood Gluc Sensor (FREESTYLE LIBRE 3 SENSOR) MISC 1 each by Does not apply route every 14 (fourteen) days. 2 each 6   Dulaglutide (TRULICITY) 3 MG/0.5ML SOPN Inject 3 mg as directed once a week. 4 mL 4   gabapentin (NEURONTIN) 300 MG capsule Take 1 capsule (300 mg total) by mouth at bedtime. 30 capsule 2   glimepiride (AMARYL) 2 MG tablet Take 1 tablet (2 mg  total) by mouth every morning. 30 tablet 11   insulin glargine (LANTUS) 100 unit/mL SOPN Inject 10 Units into the skin daily. 15 mL 11   JARDIANCE 25 MG TABS tablet TAKE 1 TABLET BY MOUTH ONCE DAILY IN THE MORNING 30 tablet 0   losartan (COZAAR) 50 MG tablet Take 1 tablet (50 mg total) by mouth daily. 30 tablet 3   Metoprolol Tartrate 75 MG TABS Take 1 tablet by mouth daily. (Patient taking differently: Take 75 mg by mouth daily.) 60 tablet 1   Multiple Vitamins-Minerals (MULTIVITAMIN WITH MINERALS) tablet Take 1 tablet by mouth daily.     nitroGLYCERIN (NITROSTAT) 0.4 MG SL tablet Place 0.4 mg under the tongue every 5 (five) minutes as needed for chest pain.      oxyCODONE-acetaminophen (PERCOCET) 10-325 MG tablet Take 1 tablet by mouth every 8 (eight) hours as needed for pain. 90 tablet 0   oxymetazoline (AFRIN) 0.05 % nasal spray Place 1  spray into both nostrils 2 (two) times daily as needed for congestion.     REPATHA SURECLICK 140 MG/ML SOAJ 140 mg sub cutaneous every 2 week (Patient taking differently: Inject 140 mg into the skin every 14 (fourteen) days.) 2 mL 6   Turmeric (QC TUMERIC COMPLEX) 500 MG CAPS Take 1 tablet by mouth daily.     Current Facility-Administered Medications  Medication Dose Route Frequency Provider Last Rate Last Admin   labetalol (NORMODYNE) tablet 200 mg  200 mg Oral Once Sherren Kerns, MD        Physical Examination  Vitals:   05/12/22 1332  BP: (!) 172/94  Pulse: 87  Temp: 97.6 F (36.4 C)  TempSrc: Temporal  SpO2: 96%  Weight: 218 lb (98.9 kg)    Body mass index is 33.15 kg/m.  General:  Alert and oriented, no acute distress HEENT: Normal Neck: No bruit or JVD Pulmonary: Clear to auscultation bilaterally Cardiac: Regular Rate and Rhythm without murmur Abdomen: Soft, non-tender, non-distended, no mass, no scars Skin: No rash Extremity: right posterior area over the achillis wound. No ischemic skin changes on the foot and  toes Musculoskeletal: mild B LE edema  Neurologic: Upper and lower extremity motor grossly intact  and symmetric  DATA:   +----------+--------+-----+---------------+----------+--------+  RIGHT    PSV cm/sRatioStenosis       Waveform  Comments  +----------+--------+-----+---------------+----------+--------+  CFA Distal110                         triphasic           +----------+--------+-----+---------------+----------+--------+  SFA Prox  468          75-99% stenosistriphasic           +----------+--------+-----+---------------+----------+--------+  SFA Mid   136                         biphasic            +----------+--------+-----+---------------+----------+--------+  SFA Distal62                          monophasic          +----------+--------+-----+---------------+----------+--------+  POP Prox  89                          biphasic            +----------+--------+-----+---------------+----------+--------+  POP Distal70                          biphasic            +----------+--------+-----+---------------+----------+--------+  TP Trunk  65                          biphasic            +----------+--------+-----+---------------+----------+--------+  PTA Prox  244                         biphasic            +----------+--------+-----+---------------+----------+--------+  PTA Mid   168                         biphasic            +----------+--------+-----+---------------+----------+--------+  PTA  [id number]                         biphasic            +----------+--------+-----+---------------+----------+--------+     Summary:  Right: 75-99% stenosis noted in the superficial femoral artery.  Patent tibioperoneal and posterior tibial arteries.     ABI Findings:  +---------+------------------+-----+-------------------+--------+  Right   Rt Pressure (mmHg)IndexWaveform           Comment    +---------+------------------+-----+-------------------+--------+  PTA                            biphasic                     +---------+------------------+-----+-------------------+--------+  DP                             dampened monophasic          +---------+------------------+-----+-------------------+--------+  Great Toe84                0.42                              +---------+------------------+-----+-------------------+--------+   +---------+------------------+-----+--------+-------+  Left    Lt Pressure (mmHg)IndexWaveformComment  +---------+------------------+-----+--------+-------+  Brachial 199                                     +---------+------------------+-----+--------+-------+  PTA     110               0.55 biphasic         +---------+------------------+-----+--------+-------+  DP      97                0.49 biphasic         +---------+------------------+-----+--------+-------+  Great Toe41                0.21                  +---------+------------------+-----+--------+-------+   +-------+----------------+-----------+----------------+----------------+  ABI/TBIToday's ABI     Today's TBIPrevious ABI    Previous TBI      +-------+----------------+-----------+----------------+----------------+  Right unable to obtain0.42       unable to obtain0.34              +-------+----------------+-----------+----------------+----------------+  Left  0.55            0.21       0.54            unable to obtain  +-------+----------------+-----------+----------------+----------------+       Summary:  Right: The right toe-brachial index is abnormal.  Unable to tolerate cuff pressure to obtain ankle-brachial index.    Left: Resting left ankle-brachial index indicates moderate left lower  extremity arterial disease. The left toe-brachial index is abnormal.   ASSESSMENT/PLAN:   PAD with chronic right LE  limb threatening ulcer.   S/P Laser arthrectomy right tibioperoneal trunk and posterior tibial artery with 1.5 mm Auryon followed by  Plain balloon angioplasty right posterior tibial and tibioperoneal trunk with 3 mm balloon.  After intervention of the left posterior tibial artery where it was subtotally occluded now with 0% residual stenosis. At completion there is a very strong posterior tibial  signal likely traced out to the foot distally.   The duplex Right: 75-99% stenosis noted in the superficial femoral artery.  Patent tibioperoneal and posterior tibial arteries. There is biphasic flow below this stenosis and he is asymptomatic for ischemia.    Biphasic PT wave forms on the right LE ABI.    He will walk for exercise as tolerates.  Continue wound care and f/u for repeat surveillance in 6 months.        Mosetta Pigeon PA-C [facility] [place] Office: [phone]  MD in clinic [place]

## 2022-05-13 ENCOUNTER — Other Ambulatory Visit: Payer: Self-pay | Admitting: Internal Medicine

## 2022-05-13 ENCOUNTER — Encounter: Payer: Self-pay | Admitting: Physician Assistant

## 2022-05-17 ENCOUNTER — Ambulatory Visit: Payer: PPO | Admitting: Internal Medicine

## 2022-05-19 ENCOUNTER — Ambulatory Visit: Payer: PPO | Admitting: Internal Medicine

## 2022-05-19 ENCOUNTER — Encounter: Payer: Self-pay | Admitting: Internal Medicine

## 2022-05-19 VITALS — BP 140/100 | HR 81 | Temp 97.6°F | Resp 18 | Ht 71.0 in | Wt 217.2 lb

## 2022-05-19 DIAGNOSIS — E785 Hyperlipidemia, unspecified: Secondary | ICD-10-CM

## 2022-05-19 DIAGNOSIS — I10 Essential (primary) hypertension: Secondary | ICD-10-CM | POA: Diagnosis not present

## 2022-05-19 DIAGNOSIS — E1122 Type 2 diabetes mellitus with diabetic chronic kidney disease: Secondary | ICD-10-CM

## 2022-05-19 DIAGNOSIS — N1832 Chronic kidney disease, stage 3b: Secondary | ICD-10-CM

## 2022-05-19 DIAGNOSIS — I739 Peripheral vascular disease, unspecified: Secondary | ICD-10-CM

## 2022-05-19 DIAGNOSIS — M25572 Pain in left ankle and joints of left foot: Secondary | ICD-10-CM

## 2022-05-19 NOTE — Assessment & Plan Note (Addendum)
I will do a panel today.

## 2022-05-19 NOTE — Assessment & Plan Note (Signed)
I will do HbA1c and renal function.

## 2022-05-19 NOTE — Assessment & Plan Note (Signed)
His blood pressure is elevated but he is in a pain.  May need to increase his Cozaar to 100 mg daily.

## 2022-05-19 NOTE — Progress Notes (Addendum)
Office Visit  Subjective   Patient ID: [name] [name]   DOB: 04-Sep-1955   Age: 67 y.o.   MRN: [id number]   Chief Complaint Chief Complaint  Patient presents with   Follow-up    Benign essential Hypertension.     History of Present Illness The patient is a 67 year old Caucasian/White male who presents with follow up.  Patient says that he has pain in his left leg lately and also has severe pain in his left ankle started 2 days ago.  He says that his ankle was swollen and he felt that he has a gout attack and he started taking colchicine 0.6 mg daily.  He also take allopurinol daily.  Yesterday he went to wound care where he could not put weight on his left ankle so I have sent him for x-ray that was done yesterday but it has not been read yet.  He denies any injury or fall.   He has a history of peripheral arterial disease with right leg critical ischemia for that he follows with vascular surgery.  He also has a wound on his right right leg for that he follows with the wound care center here in [place].  He has a bandage applied that they changed once a week and that is helping with his pain in his right leg.  He says that he has not used pain medicine in a while now.  He has s/p balloon angioplasty in 2022.   He has diabetes and his last HbA1c was 9.4 but since I have started him on Lantus his blood sugar is much better,stay around 140.  He has continuous glucose monitor.  He has a chronic kidney disease stage IIIb and he is on [place].  He is here due for his labs drawn.  Past Medical History Past Medical History:  Diagnosis Date   Cellulitis    Chronic kidney disease    CVA (cerebral vascular accident) (HCC) 2014   Diabetes mellitus without complication (HCC)    Gout    Hypertension    Obesity (BMI 30.0-34.9)    Sleep disorder      Allergies Allergies  Allergen Reactions   Ibuprofen     Other reaction(s): Other (See Comments) Contraindicated by his CKD     Review of  Systems Review of Systems  Constitutional: Negative.   Respiratory: Negative.    Cardiovascular: Negative.   Gastrointestinal: Negative.   Musculoskeletal:  Positive for joint pain.       Left ankle pain and left leg pain  Neurological: Negative.        Objective:    Vitals BP (!) 140/100 (BP Location: Right Arm, Patient Position: Sitting, Cuff Size: Normal)   Pulse 81   Temp 97.6 F (36.4 C)   Resp 18   Ht 5\' 11"  (1.803 m)   Wt 217 lb 4 oz (98.5 kg)   SpO2 92%   BMI 30.30 kg/m    Physical Examination Physical Exam Constitutional:      Appearance: Normal appearance.  HENT:     Head: Normocephalic and atraumatic.  Cardiovascular:     Rate and Rhythm: Normal rate and regular rhythm.     Heart sounds: Normal heart sounds.  Pulmonary:     Effort: Pulmonary effort is normal.     Breath sounds: Normal breath sounds.  Abdominal:     General: Bowel sounds are normal.     Palpations: Abdomen is soft.  Musculoskeletal:  Comments: Dressing on his right leg.  Neurological:     General: No focal deficit present.     Mental Status: He is alert and oriented to person, place, and time.        Assessment & Plan:   Type 2 diabetes mellitus with diabetic chronic kidney disease (HCC) I will do HbA1c and renal function.  Dyslipidemia I will do a panel today.    Acute left ankle pain xray left ankle was done yesterday. He has history of gout and I wonde if this pain is due to gouty flare up.  Benign essential hypertension His blood pressure is elevated but he is in a pain.  May need to increase his Cozaar to 100 mg daily.    Return in about 3 months (around 08/19/2022).   Eloisa Northern, MD

## 2022-05-19 NOTE — Assessment & Plan Note (Addendum)
xray left ankle was done yesterday. He has history of gout and I wonde if this pain is due to gouty flare up.

## 2022-05-20 LAB — URIC ACID: Uric Acid: 5.9 mg/dL (ref 3.8–8.4)

## 2022-05-20 LAB — CMP14 + ANION GAP
ALT: 20 IU/L (ref 0–44)
AST: 20 IU/L (ref 0–40)
Albumin/Globulin Ratio: 1.6 (ref 1.2–2.2)
Albumin: 4.3 g/dL (ref 3.9–4.9)
Alkaline Phosphatase: 77 IU/L (ref 44–121)
Anion Gap: 18 mmol/L (ref 10.0–18.0)
BUN/Creatinine Ratio: 18 (ref 10–24)
BUN: 32 mg/dL — ABNORMAL HIGH (ref 8–27)
Bilirubin Total: 0.7 mg/dL (ref 0.0–1.2)
CO2: 22 mmol/L (ref 20–29)
Calcium: 9.8 mg/dL (ref 8.6–10.2)
Chloride: 104 mmol/L (ref 96–106)
Creatinine, Ser: 1.74 mg/dL — ABNORMAL HIGH (ref 0.76–1.27)
Globulin, Total: 2.7 g/dL (ref 1.5–4.5)
Glucose: 95 mg/dL (ref 70–99)
Potassium: 4.6 mmol/L (ref 3.5–5.2)
Sodium: 144 mmol/L (ref 134–144)
Total Protein: 7 g/dL (ref 6.0–8.5)
eGFR: 43 mL/min/{1.73_m2} — ABNORMAL LOW (ref >59)

## 2022-05-20 LAB — MICROALBUMIN / CREATININE URINE RATIO
Creatinine, Urine: 70.6 mg/dL
Microalb/Creat Ratio: 119 mg/g creat — ABNORMAL HIGH (ref 0–29)
Microalbumin, Urine: 84.2 ug/mL

## 2022-05-20 LAB — HEMOGLOBIN A1C
Est. average glucose Bld gHb Est-mCnc: 157 mg/dL
Hgb A1c MFr Bld: 7.1 % — ABNORMAL HIGH (ref 4.8–5.6)

## 2022-05-20 LAB — LIPID PANEL
Chol/HDL Ratio: 3 ratio (ref 0.0–5.0)
Cholesterol, Total: 136 mg/dL (ref 100–199)
HDL: 46 mg/dL (ref >39)
LDL Chol Calc (NIH): 64 mg/dL (ref 0–99)
Triglycerides: 148 mg/dL (ref 0–149)
VLDL Cholesterol Cal: 26 mg/dL (ref 5–40)

## 2022-05-24 ENCOUNTER — Other Ambulatory Visit: Payer: Self-pay

## 2022-05-24 DIAGNOSIS — I739 Peripheral vascular disease, unspecified: Secondary | ICD-10-CM

## 2022-05-24 DIAGNOSIS — I70221 Atherosclerosis of native arteries of extremities with rest pain, right leg: Secondary | ICD-10-CM

## 2022-06-01 ENCOUNTER — Telehealth: Payer: Self-pay

## 2022-06-01 NOTE — Telephone Encounter (Signed)
Focused Pharmacist Outreach   Details of the Visit: DM :  started Lantus, aver. of 140 for BG. He uses a CGM. He had a few drops in sugars - gets to 60-65 or so. He does not have symptoms other than when he may wake up for bathroom or alarm is going off.  BP : He does check BP at home. [name] is 145/85-90. He is taking Amlodipine, Losartan and Metoprolol. He does have swelling all the time, though not extensive. Plan: June 7th for BP readings and swelling and potential adjustments. He is to stop amlodipine and take BP readings daily and report back to me if it goes >160/80  Swelling is the main concern.  Date of next Pharmacist Follow-up: 06/11/2022 Documentation loaded into clinic EMR: Done Care Plan update needed?: N/A Engagement Notes [name] on 06/01/2022 02:40 PM 05/13/22: LVMTRC 2.53pm  [phone] (H)  ( chart review/call/VM) [name] on 05/03/2022 08:45 AM Summary for PCP: - Patient needs clarification if he should be taking Amlodipine for BP. His BP was elevated at visit today; however, he said his home readings are lower. Will call him in 2 weeks for home BP readings. - Educated the patient that lisinopril was stopped and he is to take Losartan going forward. - He has not started Lantus. [facility] pharmacy and found out that they did not receive the Rx. Gave them a verbal. He will start using it from this week. - He is due for updated Lipid panel and Urine Microalbumin test at next visit.  [name], PharmD 

## 2022-06-04 ENCOUNTER — Ambulatory Visit: Payer: PPO | Admitting: Internal Medicine

## 2022-06-04 VITALS — BP 164/98 | HR 94 | Temp 98.2°F | Resp 18 | Ht 71.0 in | Wt 216.0 lb

## 2022-06-04 DIAGNOSIS — M109 Gout, unspecified: Secondary | ICD-10-CM

## 2022-06-04 MED ORDER — PREDNISONE 10 MG PO TABS: 20.0000 mg | ORAL_TABLET | Freq: Every day | ORAL | 0 refills | Status: AC

## 2022-06-04 MED ORDER — COLCHICINE 0.6 MG PO TABS: 0.6000 mg | ORAL_TABLET | Freq: Every day | ORAL | 3 refills | Status: DC

## 2022-06-04 NOTE — Progress Notes (Signed)
   Acute Office Visit  Subjective:     Patient ID: [name] [name], male    DOB: 05/20/1955, 67 y.o.   MRN: [id number]  Chief Complaint  Patient presents with   office visit    Gout pain since last week     HPI Patient is in today for severe pain left foot 2nd metatarsal area for 4 days it is the same pain when he has gout attack. No injury, no wound and her ulcer posterior left leg is shrinking. He has history of gout and he tale allopurinol  Review of Systems  Constitutional: Negative.   Musculoskeletal:        Pain right foot        Objective:    BP (!) 164/98 (BP Location: Left Arm, Patient Position: Sitting, Cuff Size: Normal)   Pulse 94   Temp 98.2 F (36.8 C)   Resp 18   Ht 5\' 11"  (1.803 m)   Wt 216 lb (98 kg)   SpO2 96%   BMI 30.13 kg/m    Physical Exam Constitutional:      Appearance: Normal appearance.  Musculoskeletal:     Comments: Tenderness right 2nd metatarsal but no wound  Neurological:     Mental Status: He is alert.     No results found for any visits on 06/04/22.      Assessment & Plan:   Problem List Items Addressed This Visit       Musculoskeletal and Integument   Gouty arthritis of right foot - Primary    He will take colchicine 0.6 mg daily for 3 months and prednisone 20 mg daily for 5 days. He will monitor his sugar at home.       No orders of the defined types were placed in this encounter.   No follow-ups on file.  Eloisa Northern, MD

## 2022-06-04 NOTE — Assessment & Plan Note (Signed)
He will take colchicine 0.6 mg daily for 3 months and prednisone 20 mg daily for 5 days. He will monitor his sugar at home.

## 2022-06-07 ENCOUNTER — Ambulatory Visit: Payer: PPO | Admitting: Internal Medicine

## 2022-06-07 ENCOUNTER — Ambulatory Visit: Payer: PPO | Admitting: Podiatry

## 2022-06-07 VITALS — BP 184/118 | HR 74

## 2022-06-07 DIAGNOSIS — E1142 Type 2 diabetes mellitus with diabetic polyneuropathy: Secondary | ICD-10-CM

## 2022-06-07 DIAGNOSIS — M19071 Primary osteoarthritis, right ankle and foot: Secondary | ICD-10-CM

## 2022-06-07 DIAGNOSIS — M19072 Primary osteoarthritis, left ankle and foot: Secondary | ICD-10-CM

## 2022-06-07 DIAGNOSIS — I10 Essential (primary) hypertension: Secondary | ICD-10-CM | POA: Diagnosis not present

## 2022-06-07 MED ORDER — GABAPENTIN 300 MG PO CAPS
300.0000 mg | ORAL_CAPSULE | Freq: Three times a day (TID) | ORAL | 3 refills | Status: DC
Start: 2022-06-07 — End: 2023-04-01

## 2022-06-07 MED ORDER — METOPROLOL TARTRATE 50 MG PO TABS: 50.0000 mg | ORAL_TABLET | Freq: Two times a day (BID) | ORAL | 6 refills | Status: DC

## 2022-06-07 MED ORDER — FUROSEMIDE 20 MG PO TABS: 20.0000 mg | ORAL_TABLET | Freq: Every day | ORAL | 6 refills | Status: DC

## 2022-06-07 MED ORDER — LOSARTAN POTASSIUM 100 MG PO TABS: 100.0000 mg | ORAL_TABLET | Freq: Every day | ORAL | 6 refills | Status: DC

## 2022-06-07 NOTE — Addendum Note (Signed)
Addended byEloisa Northern on: 06/07/2022 03:52 PM   Modules accepted: Orders, Level of Service

## 2022-06-07 NOTE — Assessment & Plan Note (Signed)
I will increase losartan to 100 mg daily, will also increase metoprolol to 50 mg twice a day, will add lasix 20 mg daily in the morning.

## 2022-06-07 NOTE — Progress Notes (Unsigned)
  Subjective:  Patient ID: [name] [name], male    DOB: 1955-09-25,  MRN: [id number]  Chief Complaint  Patient presents with   Follow-up    Follow-up right ankle pain and instability.     67 y.o. male presents to office for follow-up evaluation of right ankle pain.  Patient presents for follow-up of right ankle pain and concern for possible instability.  He reports he is still having these issues.  He was given a steroid injection last fall did not really help much he thinks.  Past Medical History:  Diagnosis Date   Cellulitis    Chronic kidney disease    CVA (cerebral vascular accident) (HCC) 2014   Diabetes mellitus without complication (HCC)    Gout    Hypertension    Obesity (BMI 30.0-34.9)    Sleep disorder     Allergies  Allergen Reactions   Ibuprofen     Other reaction(s): Other (See Comments) Contraindicated by his CKD    ROS: Negative except as per HPI above  Objective:  General: AAO x3, NAD  Dermatological: With inspection and palpation of the right and left lower extremities there are no open sores, no preulcerative lesions, no rash or signs of infection present. Nails are of normal length thickness and coloration.   Vascular:  Dorsalis Pedis artery and Posterior Tibial artery pedal pulses are 2/4 bilateral.  Capillary fill time brisk < 3 sec. Pedal hair growth present. No varicosities and no lower extremity edema present bilateral. There is no pain with calf compression, swelling, warmth, erythema.   Neruologic: Grossly intact via light touch bilateral. Protective threshold intact to all sites bilateral. Patellar and Achilles deep tendon reflexes 2+ bilateral. Negative Babinski reflex.   Musculoskeletal: Mild edema of the right ankle. Pain with palpation of the anterior ankle joint.   Gait: Unassisted, Nonantalgic.   No images are attached to the encounter.  Radiographs:  Date: September 29, 2021 XR right ankle weightbearing AP/Lateral/Oblique   Findings:  Degenerative changes noted about the right ankle joint with diminished joint space joint irregularity osteosclerosis and cyst formation. Assessment:   No diagnosis found.     Plan:  Patient was evaluated and treated and all questions answered.  #Osteoarthritis of right ankle, chronic ankle pain #Right ankle instability -Discussed with the patient that he does have evidence of pain and instability in the right ankle -Patient did not go to physical therapy -Focus for physical therapy will be for strengthening range of motion exercises in the right ankle.   -Patient denies feelings of pain says the issue is more with instability in the ankle giving out - -Also recommend the patient try using ankle brace which she says at home and I believe this will help with instability concerns.  No follow-ups on file.          Corinna Gab, DPM [facility] / [facility]

## 2022-06-07 NOTE — Progress Notes (Signed)
   Acute Office Visit  Subjective:     Patient ID: [name] [name], male    DOB: February 05, 1955, 67 y.o.   MRN: [id number]  No chief complaint on file.   HPI Patient is in today for very high blood pressure at wound center, his amlodipine was stopped by pharmacist for leg edema, his blood pressure was 205/101 at wound center, here his blood pressure is 180/110. He also take metoprolol 75 mg daily. He says that he is watching his diet. No chest pain and no SOB.   Review of Systems  Constitutional: Negative.   Respiratory: Negative.    Cardiovascular: Negative.         Objective:    BP (!) 184/118    Physical Exam Constitutional:      Appearance: Normal appearance.  Cardiovascular:     Rate and Rhythm: Normal rate and regular rhythm.     Heart sounds: Normal heart sounds.  Pulmonary:     Effort: Pulmonary effort is normal.     Breath sounds: Normal breath sounds.     No results found for any visits on 06/07/22.      Assessment & Plan:   Problem List Items Addressed This Visit       Cardiovascular and Mediastinum   Benign essential hypertension - Primary    I will increase losartan to 100 mg daily, will also increase metoprolol to 50 mg twice a day, will add lasix 20 mg daily in the morning.       No orders of the defined types were placed in this encounter.   No follow-ups on file.  Eloisa Northern, MD

## 2022-06-07 NOTE — Progress Notes (Signed)
Nurse visit due to BP check at wound care being 205/101    They called and asked [place] to recheck.

## 2022-06-11 ENCOUNTER — Other Ambulatory Visit: Payer: Self-pay | Admitting: Internal Medicine

## 2022-06-16 ENCOUNTER — Ambulatory Visit: Payer: PPO

## 2022-06-18 ENCOUNTER — Telehealth: Payer: Self-pay

## 2022-06-18 ENCOUNTER — Other Ambulatory Visit: Payer: Self-pay

## 2022-06-18 ENCOUNTER — Ambulatory Visit: Payer: PPO

## 2022-06-18 VITALS — BP 126/84

## 2022-06-18 DIAGNOSIS — N1831 Chronic kidney disease, stage 3a: Secondary | ICD-10-CM

## 2022-06-18 DIAGNOSIS — E1122 Type 2 diabetes mellitus with diabetic chronic kidney disease: Secondary | ICD-10-CM

## 2022-06-18 MED ORDER — TRULICITY 3 MG/0.5ML ~~LOC~~ SOAJ: 3.0000 mg | SUBCUTANEOUS | 4 refills | Status: DC

## 2022-06-18 NOTE — Telephone Encounter (Signed)
Focused Pharmacist Outreach  [name],[name]  67 years, Male  DOB: May 15, 1955  M:   __________________________________________________ Outreach Details Details of the Visit: 06/11/22:  Lowest 173/80 Jardiance, amlodipine 5mg , Losartan 100mg , metoprolol 50mg , glimepiride, allopurinol, clopidogrel, Vit D 5000IU, colchicine  Physically - Headaches, shortness of breath, Fluids 42oz  Call in 1hr for BP readings ---------------------------------------------------------------------------------------------06/07/22: Start 11.22am: Opened chart to check the intervention and prepared to call Patient. Called patient to report his BP reading all weekend were over 176/100. BP now 195/143. He could not obtain a 2nd reading. He took his amlodipine and additional dose of losartan. Patient will call in 1 hr and give [place] a BP reading (at 12.45pm) 1 hr after meds - 184/84 2.29pm : 175/92 advised patient to go to the office and get a BP check. He has taken amlodipine 5mg , losartan 2 x 50mg  and metoprolol 75mg  today. 06/08/22: 9.15am: Patient saw Dr.Amin yesterday and his medication doses were changed to Losartan 100mg , Metoprolol 50mg  twice a day and Furosemide 20mg . I called this morning but no response , so [facility] patient's BP this morning was 160/85 and he reports "improving". He has not picked up his medicines yet as of 10.24am. 06/18/22: Patient presented at PCP office today and his BP was 120/84. Date of next Pharmacist Follow-up: 07/23/2022 Documentation loaded into clinic EMR: Done Care Plan update needed?: N/A Engagement Notes [name] on 06/01/2022 03:40 PM DM :  started Lantus, aver. of 140 for BG. He uses a CGM. He had a few drops in sugars - gets to 60-65 or so. He does not have symptoms other than when he may wake up for bathroom or alarm is going off.   BP :  He does check BP at home. [name] is 145/85-90. He is taking Amlodipine, Losartan and Metoprolol. He does have swelling all the time,  though not extensive.  June 7th for BP readings and swelling and potential adjustments.   Swelling is the main concern. [name], PharmD Call and Documentation total time 

## 2022-06-18 NOTE — Progress Notes (Signed)
BMP lab  BP Check as well Per Dr. Nelson Chimes, pt is to continue current med regimen for BP meds due to BP reading.

## 2022-06-19 LAB — BASIC METABOLIC PANEL
BUN/Creatinine Ratio: 22 (ref 10–24)
BUN: 49 mg/dL — ABNORMAL HIGH (ref 8–27)
CO2: 21 mmol/L (ref 20–29)
Calcium: 9.3 mg/dL (ref 8.6–10.2)
Chloride: 106 mmol/L (ref 96–106)
Creatinine, Ser: 2.27 mg/dL — ABNORMAL HIGH (ref 0.76–1.27)
Glucose: 195 mg/dL — ABNORMAL HIGH (ref 70–99)
Potassium: 4.6 mmol/L (ref 3.5–5.2)
Sodium: 143 mmol/L (ref 134–144)
eGFR: 31 mL/min/{1.73_m2} — ABNORMAL LOW (ref >59)

## 2022-06-21 ENCOUNTER — Ambulatory Visit: Payer: PPO

## 2022-06-23 NOTE — Progress Notes (Signed)
Patient called.  Patient aware.  

## 2022-06-24 ENCOUNTER — Other Ambulatory Visit: Payer: Self-pay | Admitting: Internal Medicine

## 2022-06-24 MED ORDER — TRULICITY 3 MG/0.5ML ~~LOC~~ SOAJ: 3.0000 mg | SUBCUTANEOUS | 2 refills | Status: AC

## 2022-06-29 ENCOUNTER — Other Ambulatory Visit (HOSPITAL_COMMUNITY): Payer: Self-pay

## 2022-06-29 ENCOUNTER — Other Ambulatory Visit: Payer: Self-pay

## 2022-06-29 MED ORDER — TRULICITY 3 MG/0.5ML ~~LOC~~ SOAJ
3.0000 mg | SUBCUTANEOUS | 4 refills | Status: DC
  Filled 2022-06-29: qty 4, 56d supply, fill #0
  Filled 2022-06-30: qty 2, 28d supply, fill #0
  Filled 2022-07-29: qty 2, 28d supply, fill #1

## 2022-06-30 ENCOUNTER — Other Ambulatory Visit (HOSPITAL_COMMUNITY): Payer: Self-pay

## 2022-06-30 ENCOUNTER — Other Ambulatory Visit: Payer: Self-pay

## 2022-07-11 ENCOUNTER — Other Ambulatory Visit: Payer: Self-pay | Admitting: Internal Medicine

## 2022-07-19 ENCOUNTER — Ambulatory Visit (INDEPENDENT_AMBULATORY_CARE_PROVIDER_SITE_OTHER): Payer: PPO | Admitting: Podiatry

## 2022-07-19 DIAGNOSIS — Z91199 Patient's noncompliance with other medical treatment and regimen due to unspecified reason: Secondary | ICD-10-CM

## 2022-07-19 NOTE — Progress Notes (Signed)
NSA, CG

## 2022-08-02 ENCOUNTER — Ambulatory Visit: Payer: PPO | Admitting: Podiatry

## 2022-08-02 DIAGNOSIS — M25371 Other instability, right ankle: Secondary | ICD-10-CM

## 2022-08-02 DIAGNOSIS — M19071 Primary osteoarthritis, right ankle and foot: Secondary | ICD-10-CM | POA: Diagnosis not present

## 2022-08-02 DIAGNOSIS — M779 Enthesopathy, unspecified: Secondary | ICD-10-CM

## 2022-08-02 NOTE — Progress Notes (Signed)
  Subjective:  Patient ID: [name] [name], male    DOB: [birth year],  MRN: [id number]  Chief Complaint  Patient presents with   Ankle Pain    C/o bilateral ankle pain. The pain alternates between the ankles. Pain is not constant. Patient stated when he is walking he feels like his ankles are going to roll outwards. He tried using the ankles braces but he feels like they are not working.     67 y.o. male presents for follow-up of bilateral ankle pain.  She has pain alternates between ankles.  Feels like he is walking in his ankles or rolling boots.  He has tried gabapentin he says it does help him sleep at night but he is still having a lot of pain.  Past Medical History:  Diagnosis Date   Cellulitis    Chronic kidney disease    CVA (cerebral vascular accident) (HCC) 2014   Diabetes mellitus without complication (HCC)    Gout    Hypertension    Obesity (BMI 30.0-34.9)    Sleep disorder     Allergies  Allergen Reactions   Ibuprofen     Other reaction(s): Other (See Comments) Contraindicated by his CKD    ROS: Negative except as per HPI above  Objective:  General: AAO x3, NAD  Dermatological: No open wounds on the right foot or ankle.  Vascular:  Dorsalis Pedis artery and Posterior Tibial artery pedal pulses are 2/4 bilateral.  Capillary fill time brisk < 3 sec. Pedal hair growth present. There is no pain with calf compression, swelling, warmth, erythema.   Neruologic: Grossly intact via light touch bilateral. Protective threshold intact to all sites bilateral. Patellar and Achilles deep tendon reflexes 2+ bilateral. Negative Babinski reflex.   Musculoskeletal: Mild edema of the bilateral ankle.  No specific focal area with pain on palpation however does globally the whole foot has been bothering him.  Gait: Unassisted, Nonantalgic.   No images are attached to the encounter.  Radiographs:  Date: September 29, 2021 XR right ankle weightbearing AP/Lateral/Oblique    Findings: Degenerative changes noted about the right ankle joint with diminished joint space joint irregularity osteosclerosis and cyst formation. Assessment:   1. Arthritis of right ankle   2. Ankle instability, right   3. Tendonitis         Plan:  Patient was evaluated and treated and all questions answered.  #Osteoarthritis of bilateral ankle versus neuropathic pain -Again discussed likelihood of synovitis and possible ankle arthritis bilaterally right worse than left -Recommend we proceed with MRI of the right ankle at this time to evaluate for synovitis or cartilage defect.  I also want to evaluate anterior talofibular ligament calcaneofibular ligament to evaluate for damage -Order for MRI right ankle without contrast placed.  Patient will follow-up after results to go over them         Corinna Gab, DPM [facility] / [facility]

## 2022-08-03 ENCOUNTER — Other Ambulatory Visit: Payer: Self-pay

## 2022-08-03 ENCOUNTER — Other Ambulatory Visit: Payer: Self-pay | Admitting: Internal Medicine

## 2022-08-04 ENCOUNTER — Other Ambulatory Visit: Payer: Self-pay

## 2022-08-14 ENCOUNTER — Other Ambulatory Visit: Payer: Self-pay | Admitting: Internal Medicine

## 2022-08-17 ENCOUNTER — Ambulatory Visit
Admission: RE | Admit: 2022-08-17 | Discharge: 2022-08-17 | Disposition: A | Discharge status: Home / Self Care | Payer: PPO | Source: Ambulatory Visit | Attending: Podiatry | Admitting: Podiatry

## 2022-08-17 DIAGNOSIS — M25371 Other instability, right ankle: Secondary | ICD-10-CM

## 2022-08-17 DIAGNOSIS — M779 Enthesopathy, unspecified: Secondary | ICD-10-CM

## 2022-08-17 DIAGNOSIS — M19071 Primary osteoarthritis, right ankle and foot: Secondary | ICD-10-CM

## 2022-08-18 ENCOUNTER — Telehealth: Payer: Self-pay | Admitting: Podiatry

## 2022-08-18 ENCOUNTER — Ambulatory Visit: Payer: PPO | Admitting: Internal Medicine

## 2022-08-18 ENCOUNTER — Encounter: Payer: Self-pay | Admitting: Internal Medicine

## 2022-08-18 VITALS — BP 140/90 | HR 81 | Temp 97.5°F | Resp 18 | Ht 70.0 in | Wt 215.5 lb

## 2022-08-18 DIAGNOSIS — I739 Peripheral vascular disease, unspecified: Secondary | ICD-10-CM

## 2022-08-18 DIAGNOSIS — E785 Hyperlipidemia, unspecified: Secondary | ICD-10-CM | POA: Diagnosis not present

## 2022-08-18 DIAGNOSIS — E1122 Type 2 diabetes mellitus with diabetic chronic kidney disease: Secondary | ICD-10-CM

## 2022-08-18 DIAGNOSIS — I10 Essential (primary) hypertension: Secondary | ICD-10-CM

## 2022-08-18 DIAGNOSIS — N1832 Chronic kidney disease, stage 3b: Secondary | ICD-10-CM | POA: Diagnosis not present

## 2022-08-18 MED ORDER — AMLODIPINE BESYLATE 5 MG PO TABS: 5.0000 mg | ORAL_TABLET | Freq: Two times a day (BID) | ORAL | 3 refills | Status: AC

## 2022-08-18 NOTE — Assessment & Plan Note (Signed)
S/p balloon angioplasty before

## 2022-08-18 NOTE — Telephone Encounter (Signed)
Pt wanted to call and let you know that he had his MRI yesterday.

## 2022-08-18 NOTE — Assessment & Plan Note (Signed)
I will repeat kidney function today.

## 2022-08-18 NOTE — Assessment & Plan Note (Signed)
controlled 

## 2022-08-18 NOTE — Assessment & Plan Note (Signed)
LDL is target controlled.

## 2022-08-18 NOTE — Progress Notes (Signed)
Office Visit  Subjective   Patient ID: [name] [name]   DOB: 1955/08/20   Age: 67 y.o.   MRN: [id number]   Chief Complaint Chief Complaint  Patient presents with   Hypertension    3 month follow up Benign essential Hypertension     History of Present Illness The patient is a 67 year old Caucasian/White male who presents with follow up.  Patient saw podiatrist for ankle pain worst on right than left. He order MRI ankle that was done yesterday. He does not know the result yet. His leg wound has healed.     He has a history of peripheral arterial disease with right leg critical ischemia for that he follows with vascular surgery. He has s/p balloon angioplasty in 2022. They feel he dos not need anything right now.  As his leg wound has healed, he was discharged from wound care center.    He has diabetes and his last HbA1c was 7.1% on 05/19/22. He takes Trulicity 3 mg weekly, glimepiride 2 mg daily and jardiance 25 mg daily. He has CGM and says his sugar remain low. He has a chronic kidney disease stage IIIb/4 with albuminuria. His GFR was 31  two months ago. He is here for repeat labs.  and he is on [place].  He is here due for his labs drawn.  He has hypertension and his BP is 140/90 today.  He has hyperlipidemia and take repatha injection every 14 days and atorvastatin 80 mg daily.   Past Medical History Past Medical History:  Diagnosis Date   Cellulitis    Chronic kidney disease    CVA (cerebral vascular accident) (HCC) 2014   Diabetes mellitus without complication (HCC)    Gout    Hypertension    Obesity (BMI 30.0-34.9)    Sleep disorder      Allergies Allergies  Allergen Reactions   Ibuprofen     Other reaction(s): Other (See Comments) Contraindicated by his CKD     Review of Systems Review of Systems  Constitutional: Negative.   Respiratory: Negative.    Cardiovascular: Negative.   Gastrointestinal: Negative.   Musculoskeletal:  Positive for joint pain.   Neurological: Negative.        Objective:    Vitals BP (!) 140/90 (BP Location: Left Arm, Patient Position: Sitting, Cuff Size: Normal)   Pulse 81   Temp (!) 97.5 F (36.4 C)   Resp 18   Ht 5\' 10"  (1.778 m)   Wt 215 lb 8 oz (97.8 kg)   SpO2 96%   BMI 30.92 kg/m    Physical Examination Physical Exam Constitutional:      Appearance: Normal appearance. He is obese.  HENT:     Head: Normocephalic and atraumatic.  Cardiovascular:     Rate and Rhythm: Normal rate and regular rhythm.     Pulses: Normal pulses.     Heart sounds: Normal heart sounds.  Pulmonary:     Effort: Pulmonary effort is normal.     Breath sounds: Normal breath sounds.  Neurological:     General: No focal deficit present.     Mental Status: He is alert and oriented to person, place, and time.        Assessment & Plan:   Benign essential HTN controlled  PAD (peripheral artery disease) (HCC) S/p balloon angioplasty before  Type 2 diabetes mellitus with diabetic chronic kidney disease (HCC) I will repeat kidney function today.  Dyslipidemia LDL is target controlled.  Return in about 3 months (around 11/18/2022).   Eloisa Northern, MD

## 2022-08-19 ENCOUNTER — Telehealth: Payer: Self-pay | Admitting: Internal Medicine

## 2022-08-19 LAB — CMP14 + ANION GAP
ALT: 49 IU/L — ABNORMAL HIGH (ref 0–44)
AST: 39 IU/L (ref 0–40)
Albumin: 4.3 g/dL (ref 3.9–4.9)
Alkaline Phosphatase: 103 IU/L (ref 44–121)
Anion Gap: 19 mmol/L — ABNORMAL HIGH (ref 10.0–18.0)
BUN/Creatinine Ratio: 17 (ref 10–24)
BUN: 32 mg/dL — ABNORMAL HIGH (ref 8–27)
Bilirubin Total: 1.2 mg/dL (ref 0.0–1.2)
CO2: 21 mmol/L (ref 20–29)
Calcium: 9.9 mg/dL (ref 8.6–10.2)
Chloride: 104 mmol/L (ref 96–106)
Creatinine, Ser: 1.88 mg/dL — ABNORMAL HIGH (ref 0.76–1.27)
Globulin, Total: 2.8 g/dL (ref 1.5–4.5)
Glucose: 110 mg/dL — ABNORMAL HIGH (ref 70–99)
Potassium: 4.6 mmol/L (ref 3.5–5.2)
Sodium: 144 mmol/L (ref 134–144)
Total Protein: 7.1 g/dL (ref 6.0–8.5)
eGFR: 39 mL/min/{1.73_m2} — ABNORMAL LOW (ref >59)

## 2022-08-19 NOTE — Telephone Encounter (Signed)
I have called and left the message

## 2022-09-03 ENCOUNTER — Telehealth: Payer: Self-pay | Admitting: Podiatry

## 2022-09-03 NOTE — Telephone Encounter (Signed)
Pt calling about MRI results. °

## 2022-11-15 ENCOUNTER — Ambulatory Visit: Payer: PPO | Admitting: Internal Medicine

## 2022-11-21 ENCOUNTER — Other Ambulatory Visit: Payer: Self-pay | Admitting: Internal Medicine

## 2022-11-22 NOTE — Progress Notes (Unsigned)
Office Note     CC:  follow up Requesting Provider:  Eloisa Northern, MD  HPI: [name] [name] is a 66 y.o. (1955/05/07) male who presents for routine follow up of peripheral artery disease. He has undergone several interventions on his RLE for non healing wounds. Most recently in April he had a CO2 Aortogram, RLE arteriogram with lazer atherectomy of the right TPT and PT as well as balloon angioplasty of the TPT and PT. Prior to this in 2022 he had prior atherectomy and balloon angioplasty of the right PT and TPT by Dr. Randie Heinz. He has been followed by Dr. Denman George at the wound care center for his RLE wounds.  Today he reports ** He is medically managed on Atorvastatin and Plavix.    Past Medical History:  Diagnosis Date   Cellulitis    Chronic kidney disease    CVA (cerebral vascular accident) (HCC) 2014   Diabetes mellitus without complication (HCC)    Gout    Hypertension    Obesity (BMI 30.0-34.9)    Sleep disorder     Past Surgical History:  Procedure Laterality Date   ABDOMINAL AORTOGRAM W/LOWER EXTREMITY N/A 10/13/2020   Procedure: ABDOMINAL AORTOGRAM W/LOWER EXTREMITY;  Surgeon: Maeola Harman, MD;  Location: [facility] INVASIVE CV LAB;  Service: Cardiovascular;  Laterality: N/A;   ABDOMINAL AORTOGRAM W/LOWER EXTREMITY N/A 04/12/2022   Procedure: ABDOMINAL AORTOGRAM W/LOWER EXTREMITY;  Surgeon: Maeola Harman, MD;  Location: [facility] INVASIVE CV LAB;  Service: Cardiovascular;  Laterality: N/A;   Arthroscopic knee surgery     PERIPHERAL VASCULAR ATHERECTOMY  10/13/2020   Procedure: PERIPHERAL VASCULAR ATHERECTOMY;  Surgeon: Maeola Harman, MD;  Location: [facility] INVASIVE CV LAB;  Service: Cardiovascular;;  posterior tibial artery   PERIPHERAL VASCULAR ATHERECTOMY Right 04/12/2022   Procedure: PERIPHERAL VASCULAR ATHERECTOMY;  Surgeon: Maeola Harman, MD;  Location: [facility] INVASIVE CV LAB;  Service: Cardiovascular;  Laterality: Right;   PERIPHERAL VASCULAR BALLOON  ANGIOPLASTY  10/13/2020   Procedure: PERIPHERAL VASCULAR BALLOON ANGIOPLASTY;  Surgeon: Maeola Harman, MD;  Location: [facility] INVASIVE CV LAB;  Service: Cardiovascular;;  posterior tibial artery   PERIPHERAL VASCULAR INTERVENTION Right 03/11/2017   Procedure: PERIPHERAL VASCULAR INTERVENTION;  Surgeon: Sherren Kerns, MD;  Location: [facility] INVASIVE CV LAB;  Service: Cardiovascular;  Laterality: Right;  right renal artery stent   PERIPHERAL VASCULAR INTERVENTION  04/12/2022   Procedure: PERIPHERAL VASCULAR INTERVENTION;  Surgeon: Maeola Harman, MD;  Location: [facility] INVASIVE CV LAB;  Service: Cardiovascular;;   RENAL ANGIOGRAPHY N/A 03/11/2017   Procedure: RENAL ANGIOGRAPHY;  Surgeon: Sherren Kerns, MD;  Location: [facility] INVASIVE CV LAB;  Service: Cardiovascular;  Laterality: N/A;   TONSILLECTOMY      Social History   Socioeconomic History   Marital status: Married    Spouse name: Not on file   Number of children: Not on file   Years of education: Not on file   Highest education level: Not on file  Occupational History   Not on file  Tobacco Use   Smoking status: Never   Smokeless tobacco: Never  Vaping Use   Vaping status: Never Used  Substance and Sexual Activity   Alcohol use: Not Currently   Drug use: No   Sexual activity: Not on file  Other Topics Concern   Not on file  Social History Narrative   Pt ambulates in the hallway. He socialize with staff    Social Determinants of Health   Financial Resource Strain: Not on file  Food Insecurity: Not on file  Transportation Needs: Not on file  Physical Activity: Not on file  Stress: Not on file  Social Connections: Not on file  Intimate Partner Violence: Not on file   *** Family History  Problem Relation Age of Onset   Hypertension Mother    Hypertension Father    Colon cancer Paternal Grandmother     Current Outpatient Medications  Medication Sig Dispense Refill   allopurinol (ZYLOPRIM) 300 MG tablet Take 1  tablet (300 mg total) by mouth daily. 30 tablet 11   amLODipine (NORVASC) 5 MG tablet Take 1 tablet (5 mg total) by mouth 2 (two) times daily. 180 tablet 3   atorvastatin (LIPITOR) 80 MG tablet Take 80 mg by mouth daily.     Cholecalciferol (VITAMIN D3) 125 MCG (5000 UT) capsule Take 5,000 Units by mouth daily.     clopidogrel (PLAVIX) 75 MG tablet Take 1 tablet (75 mg total) by mouth daily. 30 tablet 11   colchicine 0.6 MG tablet Take 1 tablet (0.6 mg total) by mouth daily. 30 tablet 3   Continuous Blood Gluc Receiver (FREESTYLE LIBRE 3 READER) DEVI 1 each by Does not apply route every 14 (fourteen) days. 2 each 6   Continuous Glucose Sensor (FREESTYLE LIBRE 3 SENSOR) MISC APPLY 1 SENSOR TO SKIN EVERY 14 DAYS 2 each 0   Dulaglutide (TRULICITY) 3 MG/0.5ML SOPN Inject 3 mg as directed once a week. 12 mL 2   Dulaglutide (TRULICITY) 3 MG/0.5ML SOPN Inject 3 mg as directed once a week. 4 mL 4   furosemide (LASIX) 20 MG tablet Take 1 tablet (20 mg total) by mouth daily. 30 tablet 6   gabapentin (NEURONTIN) 300 MG capsule Take 1 capsule (300 mg total) by mouth at bedtime. 30 capsule 2   gabapentin (NEURONTIN) 300 MG capsule Take 1 capsule (300 mg total) by mouth 3 (three) times daily. 90 capsule 3   glimepiride (AMARYL) 2 MG tablet Take 1 tablet (2 mg total) by mouth every morning. 30 tablet 11   insulin glargine (LANTUS) 100 unit/mL SOPN Inject 40 Units into the skin daily.     JARDIANCE 25 MG TABS tablet TAKE 1 TABLET BY MOUTH ONCE DAILY IN THE MORNING 30 tablet 0   losartan (COZAAR) 100 MG tablet Take 1 tablet (100 mg total) by mouth daily. 30 tablet 6   metoprolol tartrate (LOPRESSOR) 50 MG tablet Take 1 tablet (50 mg total) by mouth 2 (two) times daily. 60 tablet 6   Multiple Vitamins-Minerals (MULTIVITAMIN WITH MINERALS) tablet Take 1 tablet by mouth daily.     nitroGLYCERIN (NITROSTAT) 0.4 MG SL tablet Place 0.4 mg under the tongue every 5 (five) minutes as needed for chest pain.       oxyCODONE-acetaminophen (PERCOCET) 10-325 MG tablet Take 1 tablet by mouth every 8 (eight) hours as needed for pain. 90 tablet 0   oxymetazoline (AFRIN) 0.05 % nasal spray Place 1 spray into both nostrils 2 (two) times daily as needed for congestion.     REPATHA SURECLICK 140 MG/ML SOAJ 140 mg sub cutaneous every 2 week (Patient taking differently: Inject 140 mg into the skin every 14 (fourteen) days.) 2 mL 6   Turmeric (QC TUMERIC COMPLEX) 500 MG CAPS Take 1 tablet by mouth daily.     No current facility-administered medications for this visit.    Allergies  Allergen Reactions   Ibuprofen     Other reaction(s): Other (See Comments) Contraindicated by his CKD  REVIEW OF SYSTEMS:  *** [X]  denotes positive finding, [ ]  denotes negative finding Cardiac  Comments:  Chest pain or chest pressure:    Shortness of breath upon exertion:    Short of breath when lying flat:    Irregular heart rhythm:        Vascular    Pain in calf, thigh, or hip brought on by ambulation:    Pain in feet at night that wakes you up from your sleep:     Blood clot in your veins:    Leg swelling:         Pulmonary    Oxygen at home:    Productive cough:     Wheezing:         Neurologic    Sudden weakness in arms or legs:     Sudden numbness in arms or legs:     Sudden onset of difficulty speaking or slurred speech:    Temporary loss of vision in one eye:     Problems with dizziness:         Gastrointestinal    Blood in stool:     Vomited blood:         Genitourinary    Burning when urinating:     Blood in urine:        Psychiatric    Major depression:         Hematologic    Bleeding problems:    Problems with blood clotting too easily:        Skin    Rashes or ulcers:        Constitutional    Fever or chills:      PHYSICAL EXAMINATION:  There were no vitals filed for this visit.  General:  WDWN in NAD; vital signs documented above Gait: Not observed HENT: WNL,  normocephalic Pulmonary: normal non-labored breathing , without Rales, rhonchi,  wheezing Cardiac: {Desc; regular/irreg:14544} HR Abdomen: soft, NT, no masses Skin: {With/Without:20273} rashes Vascular Exam/Pulses: *** Extremities: {With/Without:20273} ischemic changes, {With/Without:20273} Gangrene , {With/Without:20273} cellulitis; {With/Without:20273} open wounds;  Musculoskeletal: no muscle wasting or atrophy  Neurologic: A&O X 3*** Psychiatric:  The pt has {Desc; normal/abnormal:11317::"Normal"} affect.   Non-Invasive Vascular Imaging:   ***    ASSESSMENT/PLAN:: 67 y.o. male here for follow up for ***   -***   Graceann Congress, PA-C [facility] [phone]  Clinic MD:   Judeth Cornfield

## 2022-11-23 ENCOUNTER — Ambulatory Visit (HOSPITAL_COMMUNITY): Payer: PPO

## 2022-11-23 ENCOUNTER — Ambulatory Visit: Payer: PPO

## 2022-11-29 ENCOUNTER — Other Ambulatory Visit: Payer: Self-pay

## 2022-11-29 MED ORDER — FUROSEMIDE 20 MG PO TABS: 20.0000 mg | ORAL_TABLET | Freq: Every day | ORAL | 6 refills | Status: DC

## 2022-11-29 MED ORDER — LOSARTAN POTASSIUM 100 MG PO TABS: 100.0000 mg | ORAL_TABLET | Freq: Every day | ORAL | 6 refills | Status: DC

## 2022-11-29 MED ORDER — METOPROLOL TARTRATE 50 MG PO TABS: 50.0000 mg | ORAL_TABLET | Freq: Two times a day (BID) | ORAL | 6 refills | Status: AC

## 2022-12-27 ENCOUNTER — Ambulatory Visit: Payer: PPO | Admitting: Internal Medicine

## 2022-12-27 VITALS — BP 140/80 | HR 78 | Temp 97.8°F | Resp 18 | Wt 224.0 lb

## 2022-12-27 DIAGNOSIS — J302 Other seasonal allergic rhinitis: Secondary | ICD-10-CM | POA: Diagnosis not present

## 2022-12-27 DIAGNOSIS — R0989 Other specified symptoms and signs involving the circulatory and respiratory systems: Secondary | ICD-10-CM | POA: Diagnosis not present

## 2022-12-27 LAB — POC INFLUENZA A&B (BINAX/QUICKVUE)
Influenza A, POC: NEGATIVE
Influenza B, POC: NEGATIVE

## 2022-12-27 MED ORDER — FLUTICASONE PROPIONATE 50 MCG/ACT NA SUSP: 1.0000 | Freq: Every day | NASAL | 2 refills | Status: DC

## 2022-12-27 MED ORDER — TRIAMCINOLONE ACETONIDE 40 MG/ML IJ SUSP
80.0000 mg | Freq: Once | INTRAMUSCULAR | Status: AC
Start: 2022-12-27 — End: 2022-12-27
  Administered 2022-12-27: 80 mg via INTRAMUSCULAR

## 2022-12-27 MED ORDER — FREESTYLE LIBRE 3 SENSOR MISC: 11 refills | Status: AC

## 2022-12-27 MED ORDER — FEXOFENADINE HCL 180 MG PO TABS: 180.0000 mg | ORAL_TABLET | Freq: Every day | ORAL | 3 refills | Status: DC

## 2022-12-27 NOTE — Progress Notes (Signed)
   Acute Office Visit  Subjective:     Patient ID: [name] [name], male    DOB: May 06, 1955, 67 y.o.   MRN: [id number]  Chief Complaint  Patient presents with   office visit    Runny nose for 4 weeks     HPI Patient is in today for running nose for 4 weeks.He has pressure behind his left eye. No cough, no SOB.  No fever or chills. He feel tired.  Nasal secretions are clear.   Review of Systems  Constitutional: Negative.   HENT:  Positive for congestion.   Respiratory: Negative.    Cardiovascular: Negative.   Gastrointestinal: Negative.         Objective:    BP (!) 140/80 (BP Location: Left Arm, Patient Position: Sitting, Cuff Size: Normal)   Pulse 78   Temp 97.8 F (36.6 C)   Resp 18   Wt 224 lb (101.6 kg)   BMI 32.14 kg/m    Physical Exam Constitutional:      Appearance: Normal appearance.  Cardiovascular:     Rate and Rhythm: Normal rate and regular rhythm.     Heart sounds: Normal heart sounds.  Pulmonary:     Effort: Pulmonary effort is normal.     Breath sounds: Normal breath sounds.  Neurological:     Mental Status: He is alert.     No results found for any visits on 12/27/22.      Assessment & Plan:   Problem List Items Addressed This Visit       Respiratory   Seasonal allergic rhinitis   I will start him on fluticasone and fexofenadine. I will also given him kenalog injection. He will monitor his sugar.         Other   Runny nose - Primary   Relevant Orders   POC Influenza A&B(BINAX/QUICKVUE) (Completed)   His COVID test is negative.  No orders of the defined types were placed in this encounter.   No follow-ups on file.  Eloisa Northern, MD

## 2022-12-27 NOTE — Assessment & Plan Note (Signed)
I will start him on fluticasone and fexofenadine. I will also given him kenalog injection. He will monitor his sugar.

## 2023-02-14 ENCOUNTER — Other Ambulatory Visit: Payer: Self-pay | Admitting: Internal Medicine

## 2023-02-18 ENCOUNTER — Ambulatory Visit: Payer: PPO | Admitting: Internal Medicine

## 2023-02-18 VITALS — BP 144/90 | Wt 215.0 lb

## 2023-02-18 DIAGNOSIS — I10 Essential (primary) hypertension: Secondary | ICD-10-CM

## 2023-02-18 NOTE — Progress Notes (Signed)
Nurse Bp Check      Putting patient on CCM program

## 2023-03-11 ENCOUNTER — Ambulatory Visit: Admitting: Internal Medicine

## 2023-03-11 VITALS — BP 122/78 | HR 78 | Temp 97.6°F | Resp 18 | Ht 70.0 in | Wt 212.5 lb

## 2023-03-11 DIAGNOSIS — N401 Enlarged prostate with lower urinary tract symptoms: Secondary | ICD-10-CM | POA: Insufficient documentation

## 2023-03-11 DIAGNOSIS — N1832 Chronic kidney disease, stage 3b: Secondary | ICD-10-CM

## 2023-03-11 DIAGNOSIS — R350 Frequency of micturition: Secondary | ICD-10-CM

## 2023-03-11 DIAGNOSIS — E785 Hyperlipidemia, unspecified: Secondary | ICD-10-CM | POA: Diagnosis not present

## 2023-03-11 DIAGNOSIS — E1122 Type 2 diabetes mellitus with diabetic chronic kidney disease: Secondary | ICD-10-CM | POA: Diagnosis not present

## 2023-03-11 LAB — POCT URINALYSIS DIPSTICK
Bilirubin, UA: NEGATIVE
Ketones, UA: NEGATIVE
Spec Grav, UA: 1.015 (ref 1.010–1.025)
pH, UA: 6 (ref 5.0–8.0)

## 2023-03-11 MED ORDER — TAMSULOSIN HCL 0.4 MG PO CAPS: 0.4000 mg | ORAL_CAPSULE | Freq: Every day | ORAL | 3 refills | Status: DC

## 2023-03-11 NOTE — Assessment & Plan Note (Signed)
 I will start him on Flomax 0.4 mg daily and if his frequent urination is not better then he will call.

## 2023-03-11 NOTE — Progress Notes (Signed)
 Office Visit  Subjective   Patient ID: [name] [name]   DOB: 1955/11/07   Age: 68 y.o.   MRN: [id number]   Chief Complaint Chief Complaint  Patient presents with   Urinating too frequently     History of Present Illness 68 years old male is here c/o frequent urination, he says that he wake up every hour at night. He sleep 5-6 hours with interrupted sleep. He denies any burning urine. He take jardiance in the morning. His urine stream is also weak.   He has diabetes and he says that he check sugar and fasting sugar stay around 80-120 and after meal stay around 160 mg/dl. Occasionally it goes above 200 mg/dl. He has stopped taking lantus  that he was taking 40 units daily, last prescription was filled 08/2022. He takes, glimepride 2 mg, jatdiance 25 mg and trulicity 3 mg weekly. His last Hb A1c was 7.1% in May 2024. His random blood sugar was 116 mg/dl.   He has CKD 3b with microalbuminuria.     He also has hyperlipidemia atorvastatin 80 mg daily and his LDL was 64 nine months ago.  As he has moved to [place] and he is here so will check his cholesterol as well.  Past Medical History Past Medical History:  Diagnosis Date   Cellulitis    Chronic kidney disease    CVA (cerebral vascular accident) (HCC) 2014   Diabetes mellitus without complication (HCC)    Gout    Hypertension    Obesity (BMI 30.0-34.9)    Sleep disorder      Allergies Allergies  Allergen Reactions   Ibuprofen     Other reaction(s): Other (See Comments) Contraindicated by his CKD     Review of Systems Review of Systems  Constitutional: Negative.   Respiratory: Negative.    Cardiovascular: Negative.   Gastrointestinal: Negative.   Genitourinary:  Positive for frequency.  Neurological: Negative.        Objective:    Vitals BP 122/78 (BP Location: Right Arm, Patient Position: Sitting, Cuff Size: Normal)   Pulse 78   Temp 97.6 F (36.4 C)   Resp 18   Ht 5\' 10"  (1.778 m)   Wt 212 lb 8 oz  (96.4 kg)   SpO2 98%   BMI 30.49 kg/m    Physical Examination Physical Exam Constitutional:      Appearance: Normal appearance. He is obese.  Cardiovascular:     Rate and Rhythm: Normal rate and regular rhythm.     Heart sounds: Normal heart sounds.  Pulmonary:     Effort: Pulmonary effort is normal.     Breath sounds: Normal breath sounds.  Abdominal:     General: Bowel sounds are normal.     Palpations: Abdomen is soft.  Neurological:     General: No focal deficit present.     Mental Status: He is alert and oriented to person, place, and time.        Assessment & Plan:   Type 2 diabetes mellitus with diabetic chronic kidney disease (HCC)   He has controlled diabetes mellitus with chronic kidney disease stage IIIB.  I will repeat hemoglobin A1c and CMP today.  I will also do urine microalbumin.  He takes Jardiance for that.  Benign prostatic hyperplasia with urinary frequency   I will start him on Flomax 0.4 mg daily and if his frequent urination is not better then he will call.  Dyslipidemia   He take atorvastatin 80  mg daily and I also have repatha injection on my list but he says that he does not take it.  I will check his lipid panel today.    No follow-ups on file.   Eloisa Northern, MD

## 2023-03-11 NOTE — Assessment & Plan Note (Signed)
 He has controlled diabetes mellitus with chronic kidney disease stage IIIB.  I will repeat hemoglobin A1c and CMP today.  I will also do urine microalbumin.  He takes Jardiance for that.

## 2023-03-11 NOTE — Assessment & Plan Note (Signed)
 He take atorvastatin 80 mg daily and I also have repatha injection on my list but he says that he does not take it.  I will check his lipid panel today.

## 2023-03-12 LAB — HEMOGLOBIN A1C
Est. average glucose Bld gHb Est-mCnc: 134 mg/dL
Hgb A1c MFr Bld: 6.3 % — ABNORMAL HIGH (ref 4.8–5.6)

## 2023-03-12 LAB — CMP14 + ANION GAP
ALT: 17 IU/L (ref 0–44)
AST: 16 IU/L (ref 0–40)
Albumin: 4.3 g/dL (ref 3.9–4.9)
Alkaline Phosphatase: 87 IU/L (ref 44–121)
Anion Gap: 16 mmol/L (ref 10.0–18.0)
BUN/Creatinine Ratio: 21 (ref 10–24)
BUN: 33 mg/dL — ABNORMAL HIGH (ref 8–27)
Bilirubin Total: 1 mg/dL (ref 0.0–1.2)
CO2: 24 mmol/L (ref 20–29)
Calcium: 9.8 mg/dL (ref 8.6–10.2)
Chloride: 104 mmol/L (ref 96–106)
Creatinine, Ser: 1.54 mg/dL — ABNORMAL HIGH (ref 0.76–1.27)
Globulin, Total: 2.4 g/dL (ref 1.5–4.5)
Glucose: 116 mg/dL — ABNORMAL HIGH (ref 70–99)
Potassium: 4.8 mmol/L (ref 3.5–5.2)
Sodium: 144 mmol/L (ref 134–144)
Total Protein: 6.7 g/dL (ref 6.0–8.5)
eGFR: 50 mL/min/{1.73_m2} — ABNORMAL LOW (ref >59)

## 2023-03-12 LAB — LIPID PANEL
Chol/HDL Ratio: 5.9 ratio — ABNORMAL HIGH (ref 0.0–5.0)
Cholesterol, Total: 225 mg/dL — ABNORMAL HIGH (ref 100–199)
HDL: 38 mg/dL — ABNORMAL LOW (ref >39)
LDL Chol Calc (NIH): 137 mg/dL — ABNORMAL HIGH (ref 0–99)
Triglycerides: 274 mg/dL — ABNORMAL HIGH (ref 0–149)
VLDL Cholesterol Cal: 50 mg/dL — ABNORMAL HIGH (ref 5–40)

## 2023-03-12 LAB — PSA: Prostate Specific Ag, Serum: 0.5 ng/mL (ref 0.0–4.0)

## 2023-03-14 ENCOUNTER — Other Ambulatory Visit: Payer: Self-pay | Admitting: Internal Medicine

## 2023-03-14 MED ORDER — REPATHA SURECLICK 140 MG/ML ~~LOC~~ SOAJ: SUBCUTANEOUS | 11 refills | Status: DC

## 2023-03-14 NOTE — Addendum Note (Signed)
 Addended by: Selah Zelman on: 03/14/2023 01:12 PM   Modules accepted: Orders

## 2023-03-14 NOTE — Progress Notes (Signed)
 I have spoken with him about his results. His LDL is 137 and I have suggested to restart Repatha injection every 2 weeks and continue with Atorvastatin 80 mg daily.

## 2023-03-19 ENCOUNTER — Other Ambulatory Visit: Payer: Self-pay | Admitting: Internal Medicine

## 2023-03-29 DIAGNOSIS — I10 Essential (primary) hypertension: Secondary | ICD-10-CM | POA: Diagnosis not present

## 2023-03-29 DIAGNOSIS — I739 Peripheral vascular disease, unspecified: Secondary | ICD-10-CM | POA: Diagnosis not present

## 2023-03-29 DIAGNOSIS — E1122 Type 2 diabetes mellitus with diabetic chronic kidney disease: Secondary | ICD-10-CM | POA: Diagnosis not present

## 2023-04-01 ENCOUNTER — Encounter: Payer: Self-pay | Admitting: Internal Medicine

## 2023-04-01 ENCOUNTER — Ambulatory Visit: Admitting: Internal Medicine

## 2023-04-01 VITALS — BP 130/90 | HR 78 | Temp 98.0°F | Resp 18 | Ht 70.0 in | Wt 218.4 lb

## 2023-04-01 DIAGNOSIS — I10 Essential (primary) hypertension: Secondary | ICD-10-CM

## 2023-04-01 DIAGNOSIS — N1832 Chronic kidney disease, stage 3b: Secondary | ICD-10-CM | POA: Diagnosis not present

## 2023-04-01 DIAGNOSIS — G629 Polyneuropathy, unspecified: Secondary | ICD-10-CM | POA: Insufficient documentation

## 2023-04-01 DIAGNOSIS — R350 Frequency of micturition: Secondary | ICD-10-CM

## 2023-04-01 DIAGNOSIS — N401 Enlarged prostate with lower urinary tract symptoms: Secondary | ICD-10-CM

## 2023-04-01 DIAGNOSIS — E785 Hyperlipidemia, unspecified: Secondary | ICD-10-CM | POA: Diagnosis not present

## 2023-04-01 DIAGNOSIS — E1122 Type 2 diabetes mellitus with diabetic chronic kidney disease: Secondary | ICD-10-CM | POA: Diagnosis not present

## 2023-04-01 MED ORDER — PREGABALIN 50 MG PO CAPS: 50.0000 mg | ORAL_CAPSULE | Freq: Three times a day (TID) | ORAL | 2 refills | Status: AC

## 2023-04-01 MED ORDER — ATORVASTATIN CALCIUM 80 MG PO TABS: 80.0000 mg | ORAL_TABLET | Freq: Every day | ORAL | 3 refills | Status: AC

## 2023-04-01 MED ORDER — TAMSULOSIN HCL 0.4 MG PO CAPS: 0.8000 mg | ORAL_CAPSULE | Freq: Every day | ORAL | 3 refills | Status: DC

## 2023-04-01 NOTE — Assessment & Plan Note (Signed)
 I have restarted his atorvastatin 80 mg daily.  Will repeat lipid panel on next visit.

## 2023-04-01 NOTE — Progress Notes (Signed)
 Office Visit  Subjective   Patient ID: [name] [name]   DOB: Dec 09, 1955   Age: 68 y.o.   MRN: [id number]   Chief Complaint Chief Complaint  Patient presents with   Medication change    Follow up     History of Present Illness 67 years old male is c/o that his urine is some better, he can sleep 3 hours and driving from [place] to here he has to stop one time. I have started him on Flomax 0.4 mg daily.  He says that it is helping.    He also is complaining of burning in his both feet for long time.  He has stop using gabapentin for unknown reason.  He says that it does not hurt when he walk but he does has burning in both feet but worse at nighttime.   He has diabetes and he says that he check sugar and   His sugar been better.  His hemoglobin A1c was 6.3 2 weeks ago.    His cholesterol was well  controlled 10 month ago with LDL of 64 but 3 weeks ago LDL was 137. Apparently he has stopped taking atorvastatin 80 mg because he ran out of refills and he never informants.  He has moved to [facility] area and he drove from there to [place] this morning.   He has CKD 3b with   GFR 7 month ago so was 39 and 3 weeks ago GFR was 50. He does has microalbuminuria.   He has hypertension and his blood pressure is acceptable even though he drove from [place] be this morning.     Past Medical History Past Medical History:  Diagnosis Date   Cellulitis    Chronic kidney disease    CVA (cerebral vascular accident) (HCC) 2014   Diabetes mellitus without complication (HCC)    Gout    Hypertension    Obesity (BMI 30.0-34.9)    Sleep disorder      Allergies Allergies  Allergen Reactions   Ibuprofen     Other reaction(s): Other (See Comments) Contraindicated by his CKD     Review of Systems Review of Systems  Constitutional: Negative.   HENT: Negative.    Respiratory: Negative.    Cardiovascular: Negative.   Gastrointestinal: Negative.   Neurological: Negative.        Objective:     Vitals BP (!) 130/90 (BP Location: Right Arm, Patient Position: Sitting, Cuff Size: Normal)   Pulse 78   Temp 98 F (36.7 C)   Resp 18   Ht 5\' 10"  (1.778 m)   Wt 218 lb 6 oz (99.1 kg)   SpO2 97%   BMI 31.33 kg/m    Physical Examination Physical Exam Constitutional:      Appearance: Normal appearance.  Cardiovascular:     Rate and Rhythm: Normal rate and regular rhythm.     Heart sounds: Normal heart sounds.  Pulmonary:     Effort: Pulmonary effort is normal.     Breath sounds: Normal breath sounds.  Abdominal:     General: Bowel sounds are normal.     Palpations: Abdomen is soft.  Neurological:     General: No focal deficit present.     Mental Status: He is alert and oriented to person, place, and time.        Assessment & Plan:   Dyslipidemia   I have restarted his atorvastatin 80 mg daily.  Will repeat lipid panel on next visit.  Benign prostatic hyperplasia with urinary frequency   His symptoms are better and I will increase Flomax to 0.8 mg daily.  Peripheral polyneuropathy   I will start him on pregabalin 50 mg twice a day.  Type 2 diabetes mellitus with diabetic chronic kidney disease (HCC)   His blood sugar is well controlled.  His kidney function was slightly better but will repeat on next visit as well.     As he is moved to [facility], until he establish with physician there either he can not do video conference or come to our office.  Return in about 6 months (around 10/02/2023).   Eloisa Northern, MD

## 2023-04-01 NOTE — Assessment & Plan Note (Signed)
 His symptoms are better and I will increase Flomax to 0.8 mg daily.

## 2023-04-01 NOTE — Assessment & Plan Note (Signed)
 His blood sugar is well controlled.  His kidney function was slightly better but will repeat on next visit as well.

## 2023-04-01 NOTE — Assessment & Plan Note (Signed)
 I will start him on pregabalin 50 mg twice a day.

## 2023-04-28 DIAGNOSIS — J0100 Acute maxillary sinusitis, unspecified: Secondary | ICD-10-CM | POA: Diagnosis not present

## 2023-04-28 DIAGNOSIS — R0981 Nasal congestion: Secondary | ICD-10-CM | POA: Diagnosis not present

## 2023-05-06 ENCOUNTER — Other Ambulatory Visit: Payer: Self-pay | Admitting: Internal Medicine

## 2023-05-07 ENCOUNTER — Other Ambulatory Visit: Payer: Self-pay | Admitting: Vascular Surgery

## 2023-05-10 ENCOUNTER — Other Ambulatory Visit: Payer: Self-pay | Admitting: Internal Medicine

## 2023-05-17 ENCOUNTER — Telehealth: Payer: Self-pay

## 2023-05-17 NOTE — Telephone Encounter (Signed)
 LM regarding Plavix  refill request (see previous note).  Also, needs f/u appt with Dr. Vikki Graves

## 2023-05-19 ENCOUNTER — Telehealth: Payer: Self-pay

## 2023-05-19 NOTE — Telephone Encounter (Signed)
 Plavix :  Received multiple pharmacy requests to refill. Plavix  last refilled 2024  Attempted MyChart messages and phone messages w/o response.   Past due f/u appt

## 2023-06-03 ENCOUNTER — Encounter: Payer: Self-pay | Admitting: Internal Medicine

## 2023-06-03 ENCOUNTER — Other Ambulatory Visit: Payer: Self-pay | Admitting: Internal Medicine

## 2023-06-03 ENCOUNTER — Ambulatory Visit: Admitting: Internal Medicine

## 2023-06-03 VITALS — BP 126/88 | HR 70 | Temp 97.8°F | Resp 18 | Ht 70.0 in | Wt 213.4 lb

## 2023-06-03 DIAGNOSIS — R350 Frequency of micturition: Secondary | ICD-10-CM

## 2023-06-03 DIAGNOSIS — I10 Essential (primary) hypertension: Secondary | ICD-10-CM | POA: Diagnosis not present

## 2023-06-03 DIAGNOSIS — S81802A Unspecified open wound, left lower leg, initial encounter: Secondary | ICD-10-CM | POA: Diagnosis not present

## 2023-06-03 DIAGNOSIS — E785 Hyperlipidemia, unspecified: Secondary | ICD-10-CM | POA: Diagnosis not present

## 2023-06-03 LAB — POCT URINALYSIS DIPSTICK
Bilirubin, UA: NEGATIVE
Glucose, UA: POSITIVE — AB
Ketones, UA: NEGATIVE
Nitrite, UA: NEGATIVE
Protein, UA: NEGATIVE
Spec Grav, UA: 1.015 (ref 1.010–1.025)
Urobilinogen, UA: 0.2 U/dL
pH, UA: 5.5 (ref 5.0–8.0)

## 2023-06-03 MED ORDER — TAMSULOSIN HCL 0.4 MG PO CAPS: 0.8000 mg | ORAL_CAPSULE | Freq: Every day | ORAL | 3 refills | Status: AC

## 2023-06-03 MED ORDER — MIRABEGRON ER 50 MG PO TB24: 50.0000 mg | ORAL_TABLET | Freq: Every day | ORAL | 6 refills | Status: DC

## 2023-06-03 MED ORDER — CEPHALEXIN 500 MG PO CAPS: 500.0000 mg | ORAL_CAPSULE | Freq: Four times a day (QID) | ORAL | 0 refills | Status: AC

## 2023-06-03 NOTE — Assessment & Plan Note (Signed)
 He will clean that to wound daily and I will send Keflex in for 7 days.

## 2023-06-03 NOTE — Assessment & Plan Note (Signed)
 There is no sign of any infection.  He will continue with Flomax  and I will add Myrbetriq 50 mg daily.

## 2023-06-03 NOTE — Assessment & Plan Note (Signed)
 Controlled.

## 2023-06-03 NOTE — Assessment & Plan Note (Signed)
 He is on atorvastatin  80 mg daily and Repatha  was added.  I will repeat lipid panel on next visit.

## 2023-06-03 NOTE — Progress Notes (Signed)
 Office Visit  Subjective   Patient ID: [name] [name]   DOB: 10-16-1955   Age: 68 y.o.   MRN: [id number]   Chief Complaint Chief Complaint  Patient presents with   Urinating Frequenting frequently     History of Present Illness   68 years old male is here complaining of frequent urination.  He has a history of BPH and was taking Flomax  0.4 mg 2 tablets at nighttime but he still wake up every 2 hours to urinate.  He denies having any burning urination.  No fever or chills.  His urine analysis was negative any sign of infection.  But he does has a 500  Mg glucose.  He has diabetes mellitus and he take Trulicity  3 mg once a week and Jardiance 25 mg daily.  His hemoglobin A1c was 6.3 on March 11, 2023. His LDL was not target control on LDL was 137 on March 2025. He takes atorvastatin  80 mg daily.  I have added Repatha  injection twice a month and will repeat lipid panel on next visit.    He also says that he bumped his right shin and wound is looking nasty with the surrounding redness and pain in his shin.    He has hypertension  And his blood pressure is controlled.  Past Medical History Past Medical History:  Diagnosis Date   Cellulitis    Chronic kidney disease    CVA (cerebral vascular accident) (HCC) 2014   Diabetes mellitus without complication (HCC)    Gout    Hypertension    Obesity (BMI 30.0-34.9)    Sleep disorder      Allergies Allergies  Allergen Reactions   Ibuprofen     Other reaction(s): Other (See Comments) Contraindicated by his CKD     Review of Systems Review of Systems  Constitutional: Negative.   Respiratory: Negative.    Cardiovascular: Negative.   Gastrointestinal: Negative.   Genitourinary:  Positive for frequency.  Musculoskeletal:         wound right shin       Objective:    Vitals BP 126/88 (BP Location: Left Arm, Patient Position: Sitting, Cuff Size: Normal)   Pulse 70   Temp 97.8 F (36.6 C)   Resp 18   Ht 5\' 10"  (1.778 m)   Wt 213  lb 6 oz (96.8 kg)   SpO2 98%   BMI 30.62 kg/m    Physical Examination Physical Exam Constitutional:      Appearance: Normal appearance.  Cardiovascular:     Rate and Rhythm: Normal rate and regular rhythm.     Heart sounds: Normal heart sounds.  Pulmonary:     Effort: Pulmonary effort is normal.     Breath sounds: Normal breath sounds.  Musculoskeletal:     Comments:  Small wound right shin with surrounding erythema.  Neurological:     General: No focal deficit present.     Mental Status: He is alert and oriented to person, place, and time.        Assessment & Plan:   Benign essential HTN   Controlled  Urinary frequency  There is no sign of any infection.  He will continue with Flomax  and I will add Myrbetriq 50 mg daily.  Wound of left leg   He will clean that to wound daily and I will send Keflex in for 7 days.  Dyslipidemia   He is on atorvastatin  80 mg daily and Repatha  was added.  I will repeat lipid  panel on next visit.    Return in about 3 months (around 09/03/2023).   Tita Form, MD

## 2023-06-06 ENCOUNTER — Ambulatory Visit: Admitting: Internal Medicine

## 2023-06-08 DIAGNOSIS — I10 Essential (primary) hypertension: Secondary | ICD-10-CM | POA: Diagnosis not present

## 2023-06-08 DIAGNOSIS — I739 Peripheral vascular disease, unspecified: Secondary | ICD-10-CM | POA: Diagnosis not present

## 2023-06-08 DIAGNOSIS — E1122 Type 2 diabetes mellitus with diabetic chronic kidney disease: Secondary | ICD-10-CM | POA: Diagnosis not present

## 2023-07-11 ENCOUNTER — Ambulatory Visit: Admitting: Internal Medicine

## 2023-07-11 ENCOUNTER — Encounter: Payer: Self-pay | Admitting: Internal Medicine

## 2023-07-11 VITALS — BP 124/80 | HR 72 | Temp 98.1°F | Resp 18 | Ht 70.0 in | Wt 227.4 lb

## 2023-07-11 DIAGNOSIS — G8929 Other chronic pain: Secondary | ICD-10-CM

## 2023-07-11 DIAGNOSIS — M545 Low back pain, unspecified: Secondary | ICD-10-CM | POA: Insufficient documentation

## 2023-07-11 DIAGNOSIS — J0110 Acute frontal sinusitis, unspecified: Secondary | ICD-10-CM

## 2023-07-11 DIAGNOSIS — E785 Hyperlipidemia, unspecified: Secondary | ICD-10-CM | POA: Diagnosis not present

## 2023-07-11 MED ORDER — MIRABEGRON ER 50 MG PO TB24: 50.0000 mg | ORAL_TABLET | Freq: Every day | ORAL | 6 refills | Status: AC

## 2023-07-11 MED ORDER — METHYLPREDNISOLONE SODIUM SUCC 40 MG IJ SOLR
120.0000 mg | Freq: Once | INTRAMUSCULAR | Status: AC
  Administered 2023-07-11: 120 mg via INTRAMUSCULAR

## 2023-07-11 MED ORDER — REPATHA SURECLICK 140 MG/ML ~~LOC~~ SOAJ: SUBCUTANEOUS | 11 refills | Status: AC

## 2023-07-11 MED ORDER — SALINE SPRAY 0.65 % NA SOLN
1.0000 | NASAL | 0 refills | Status: DC | PRN
Start: 2023-07-11 — End: 2023-07-28

## 2023-07-11 MED ORDER — FLUTICASONE PROPIONATE 50 MCG/ACT NA SUSP: 1.0000 | Freq: Every day | NASAL | 2 refills | Status: DC

## 2023-07-11 NOTE — Assessment & Plan Note (Signed)
 He will continue to use atorvastatin  80 mg daily and I have send prescription of Repatha  injection 140 mg twice a month.  I will repeat lipid panel on next visit.

## 2023-07-11 NOTE — Progress Notes (Signed)
 Office Visit  Subjective   Patient ID: [name] [name]   DOB: July 26, 1955   Age: 68 y.o.   MRN: [id number]   Chief Complaint Chief Complaint  Patient presents with   Stuffy head    Been like this for months     History of Present Illness  68 years old male who is here complaining of sinus pressure and saline mist and generic nasal spray is not helping.  He feel pressure in his face when he bent forward.  He is bringing clear secretion.    He also says that Myrbetriq  that I sent for overactive bladder has helped but it cost him 45 dollar per month and is asking for generic.  I have told him that this might medicine also has a generic and if this still cost him money then he will let us  know.   He has hyperlipidemia and he was taking atorvastatin  and Repatha  but ran out of Repatha  and never have refill of this medicine.  His LDL was not at target control he denies having any side effects from atorvastatin  or Repatha  injection.  I will send refill of this medicine.    He also complaining of pain in his hip and lower back for long time but it is getting worse.  He says that he cannot do anything at home because of pain.  He put his hand on his lower back and sacroiliac area.  He described this pain as moderate to severe and nonradiating.  Pain does not wake him up at nighttime.  Past Medical History Past Medical History:  Diagnosis Date   Cellulitis    Chronic kidney disease    CVA (cerebral vascular accident) (HCC) 2014   Diabetes mellitus without complication (HCC)    Gout    Hypertension    Obesity (BMI 30.0-34.9)    Sleep disorder      Allergies Allergies  Allergen Reactions   Ibuprofen     Other reaction(s): Other (See Comments) Contraindicated by his CKD     Review of Systems Review of Systems  Constitutional: Negative.   HENT:  Positive for congestion and sinus pain.   Respiratory: Negative.    Cardiovascular: Negative.   Musculoskeletal:  Positive for back pain.   Neurological: Negative.        Objective:    Vitals BP 124/80   Pulse 72   Temp 98.1 F (36.7 C)   Resp 18   Ht 5' 10 (1.778 m)   Wt 227 lb 6 oz (103.1 kg)   SpO2 96%   BMI 32.62 kg/m    Physical Examination Physical Exam Constitutional:      Appearance: Normal appearance. He is obese.  Cardiovascular:     Rate and Rhythm: Normal rate and regular rhythm.     Heart sounds: Normal heart sounds.  Pulmonary:     Effort: Pulmonary effort is normal.     Breath sounds: Normal breath sounds.  Abdominal:     Palpations: Abdomen is soft.  Musculoskeletal:        General: No tenderness.  Neurological:     General: No focal deficit present.     Mental Status: He is alert and oriented to person, place, and time.        Assessment & Plan:   Sinusitis   I have given him Solu Medrol  120 mg intramuscular time 1.  I will send fluticasone  and he will use saline nasal spray 3 to 4 times a day.  If this did not help I will do CT sinus  Or refer him to see ENT.  Dyslipidemia  He will continue to use atorvastatin  80 mg daily and I have send prescription of Repatha  injection 140 mg twice a month.  I will repeat lipid panel on next visit.  Chronic bilateral low back pain without sciatica   He has no tenderness but has significant pain in spite of taking pregabalin .  I will refer him to see orthopedic surgeon here for evaluation.    Return in about 1 month (around 08/11/2023).   Roetta Dare, MD

## 2023-07-11 NOTE — Assessment & Plan Note (Signed)
 He has no tenderness but has significant pain in spite of taking pregabalin .  I will refer him to see orthopedic surgeon here for evaluation.

## 2023-07-11 NOTE — Assessment & Plan Note (Signed)
 I have given him Solu Medrol  120 mg intramuscular time 1.  I will send fluticasone  and he will use saline nasal spray 3 to 4 times a day.  If this did not help I will do CT sinus  Or refer him to see ENT.

## 2023-07-15 ENCOUNTER — Other Ambulatory Visit: Payer: Self-pay | Admitting: Internal Medicine

## 2023-07-22 ENCOUNTER — Other Ambulatory Visit: Payer: Self-pay | Admitting: Internal Medicine

## 2023-07-28 ENCOUNTER — Encounter: Payer: Self-pay | Admitting: Internal Medicine

## 2023-07-28 ENCOUNTER — Ambulatory Visit: Admitting: Internal Medicine

## 2023-07-28 VITALS — BP 152/82 | HR 67 | Temp 98.0°F | Resp 16 | Ht 70.0 in | Wt 220.4 lb

## 2023-07-28 DIAGNOSIS — I872 Venous insufficiency (chronic) (peripheral): Secondary | ICD-10-CM | POA: Diagnosis not present

## 2023-07-28 DIAGNOSIS — L97919 Non-pressure chronic ulcer of unspecified part of right lower leg with unspecified severity: Secondary | ICD-10-CM | POA: Diagnosis not present

## 2023-07-28 DIAGNOSIS — I83019 Varicose veins of right lower extremity with ulcer of unspecified site: Secondary | ICD-10-CM

## 2023-07-28 DIAGNOSIS — R600 Localized edema: Secondary | ICD-10-CM

## 2023-07-28 NOTE — Assessment & Plan Note (Signed)
 If no DVT then he will need [employer] and diuretic.

## 2023-07-28 NOTE — Assessment & Plan Note (Signed)
 I will do stat venous doppler right leg and if positive then will treat

## 2023-07-28 NOTE — Progress Notes (Signed)
   Acute Office Visit  Subjective:     Patient ID: [name] [name], male    DOB: 05-14-1955, 68 y.o.   MRN: [id number]  No chief complaint on file.   HPI Patient is in today for increased swelling in right leg with dark coloration and cabbed lesion right shin. He says that it hurt. He drive a lot back and fore between [address] and [place].  Review of Systems  Constitutional: Negative.   Respiratory: Negative.    Cardiovascular:  Positive for leg swelling.  Skin:  Positive for rash.        Objective:    BP (!) 152/82   Pulse 67   Temp 98 F (36.7 C) (Temporal)   Resp 16   Ht 5' 10 (1.778 m)   Wt 220 lb 6.4 oz (100 kg)   SpO2 96%   BMI 31.62 kg/m    Physical Exam Constitutional:      Appearance: Normal appearance.  Cardiovascular:     Rate and Rhythm: Normal rate and regular rhythm.     Heart sounds: Normal heart sounds.  Pulmonary:     Effort: Pulmonary effort is normal.     Breath sounds: Normal breath sounds.  Musculoskeletal:     Right lower leg: Edema present.     Comments: Right leg has erythema and scabbed lesion on shin probably related with venous ulcer.   Skin:    Findings: Erythema present.  Neurological:     Mental Status: He is alert.     No results found for any visits on 07/28/23.      Assessment & Plan:   Problem List Items Addressed This Visit       Cardiovascular and Mediastinum   Edema of right lower leg due to venous stasis - Primary   I will do stat venous doppler right leg and if positive then will treat        Musculoskeletal and Integument   Venous ulcer of right leg (HCC)   If no DVT then he will need [employer] and diuretic.       No orders of the defined types were placed in this encounter.   No follow-ups on file.  Roetta Dare, MD

## 2023-08-02 ENCOUNTER — Other Ambulatory Visit: Payer: Self-pay

## 2023-08-02 NOTE — Telephone Encounter (Signed)
 Patient called and patient aware of results of Venous Doppler of right leg.

## 2023-08-03 ENCOUNTER — Other Ambulatory Visit: Payer: Self-pay

## 2023-08-05 ENCOUNTER — Encounter: Payer: Self-pay | Admitting: Internal Medicine

## 2023-08-11 ENCOUNTER — Ambulatory Visit: Admitting: Internal Medicine

## 2023-08-15 ENCOUNTER — Other Ambulatory Visit: Payer: Self-pay | Admitting: Internal Medicine

## 2023-08-15 ENCOUNTER — Ambulatory Visit: Admitting: Internal Medicine

## 2023-08-18 ENCOUNTER — Ambulatory Visit: Admitting: Internal Medicine

## 2023-08-18 ENCOUNTER — Encounter: Payer: Self-pay | Admitting: Internal Medicine

## 2023-08-18 VITALS — BP 124/70 | HR 70 | Temp 98.0°F | Resp 18 | Ht 70.0 in | Wt 215.1 lb

## 2023-08-18 DIAGNOSIS — N1832 Chronic kidney disease, stage 3b: Secondary | ICD-10-CM

## 2023-08-18 DIAGNOSIS — E1122 Type 2 diabetes mellitus with diabetic chronic kidney disease: Secondary | ICD-10-CM | POA: Diagnosis not present

## 2023-08-18 DIAGNOSIS — I872 Venous insufficiency (chronic) (peripheral): Secondary | ICD-10-CM | POA: Diagnosis not present

## 2023-08-18 DIAGNOSIS — R600 Localized edema: Secondary | ICD-10-CM | POA: Diagnosis not present

## 2023-08-18 DIAGNOSIS — J019 Acute sinusitis, unspecified: Secondary | ICD-10-CM

## 2023-08-18 DIAGNOSIS — E785 Hyperlipidemia, unspecified: Secondary | ICD-10-CM

## 2023-08-18 MED ORDER — TRIAMCINOLONE ACETONIDE 0.1 % EX CREA: TOPICAL_CREAM | Freq: Two times a day (BID) | CUTANEOUS | 1 refills | Status: DC

## 2023-08-18 MED ORDER — FLUTICASONE PROPIONATE 50 MCG/ACT NA SUSP
1.0000 | Freq: Every day | NASAL | 2 refills | Status: AC
Start: 2023-08-18 — End: 2024-08-17

## 2023-08-18 NOTE — Progress Notes (Signed)
 Office Visit  Subjective   Patient ID: ADRIN JULIAN   DOB: 01-Nov-1955   Age: 68 y.o.   MRN: [id number]   Chief Complaint Chief Complaint  Patient presents with   Follow-up    Edema of right lower leg     History of Present Illness 68 years old male is here c/o increased redness and dryness to right leg.  He says that swelling is better but has not gone completely.  His venous Doppler was negative and I have advised him to use elastic stocking to right leg.  He says that he has been using it.  No pain in his leg.  He says that rash and dryness is still there.    He is also complaining of stuffy nose and cannot sleep at nighttime.  He says that he use some sort of spray over the counter.  His symptoms are there for long time. F   He has diabetes and he says that he check sugar and fasting sugar stay around 80-120.  His hemoglobin A1c on March 11, 2023 was 6.3%.  he has chronic kidney disease and his last EGFR 5 month ago was 50 that was better from a year ago.    He has hyperlipidemia and his LDL was not controlled.  He takes atorvastatin  80 mg daily and Repatha  was added.  He is in the process of moving to [place] .      Past Medical History Past Medical History:  Diagnosis Date   Cellulitis    Chronic kidney disease    CVA (cerebral vascular accident) (HCC) 2014   Diabetes mellitus without complication (HCC)    Gout    Hypertension    Obesity (BMI 30.0-34.9)    Sleep disorder      Allergies Allergies  Allergen Reactions   Ibuprofen     Other reaction(s): Other (See Comments) Contraindicated by his CKD     Review of Systems Review of Systems  Constitutional: Negative.   Respiratory: Negative.    Cardiovascular: Negative.   Gastrointestinal: Negative.   Musculoskeletal:        Dermatitis right leg  Skin:  Positive for rash.  Neurological: Negative.        Objective:    Vitals BP 124/70 (BP Location: Left Arm, Patient Position: Sitting, Cuff Size:  Normal)   Pulse 70   Temp 98 F (36.7 C)   Resp 18   Ht 5' 10 (1.778 m)   Wt 215 lb 2 oz (97.6 kg)   SpO2 99%   BMI 30.87 kg/m    Physical Examination Physical Exam Constitutional:      Appearance: Normal appearance.  HENT:     Head: Normocephalic and atraumatic.  Cardiovascular:     Rate and Rhythm: Normal rate and regular rhythm.     Heart sounds: Normal heart sounds.  Pulmonary:     Effort: Pulmonary effort is normal.     Breath sounds: Normal breath sounds.  Abdominal:     General: Bowel sounds are normal.     Palpations: Abdomen is soft.  Skin:    Findings: Erythema present.     Comments: Right leg dryness and erythematous changes  Neurological:     General: No focal deficit present.     Mental Status: He is alert and oriented to person, place, and time.        Assessment & Plan:   Edema of right lower leg due to venous stasis   He will  continue to use elastic stocking.  For dermatitis he will apply Eucerin and triamcinolone  cream twice a day  Sinusitis  He will use fluticasone  1 spray to each nostril daily.  Type 2 diabetes mellitus with diabetic chronic kidney disease (HCC)   His blood sugar is well controlled.  Dyslipidemia   I will do lipid panel and CMP.    Return in about 3 months (around 11/18/2023).   Roetta Dare, MD

## 2023-08-18 NOTE — Assessment & Plan Note (Signed)
 He will continue to use elastic stocking.  For dermatitis he will apply Eucerin and triamcinolone  cream twice a day

## 2023-08-18 NOTE — Assessment & Plan Note (Signed)
 His blood sugar is well controlled

## 2023-08-18 NOTE — Assessment & Plan Note (Signed)
 He will use fluticasone  1 spray to each nostril daily.

## 2023-08-18 NOTE — Assessment & Plan Note (Signed)
 I will do lipid panel and CMP.

## 2023-08-19 ENCOUNTER — Ambulatory Visit: Payer: Self-pay

## 2023-08-19 LAB — LIPID PANEL
Chol/HDL Ratio: 2.6 ratio (ref 0.0–5.0)
Cholesterol, Total: 88 mg/dL — ABNORMAL LOW (ref 100–199)
HDL: 34 mg/dL — ABNORMAL LOW (ref >39)
LDL Chol Calc (NIH): 11 mg/dL (ref 0–99)
Triglycerides: 300 mg/dL — ABNORMAL HIGH (ref 0–149)
VLDL Cholesterol Cal: 43 mg/dL — ABNORMAL HIGH (ref 5–40)

## 2023-08-19 LAB — CMP14 + ANION GAP
ALT: 12 IU/L (ref 0–44)
AST: 11 IU/L (ref 0–40)
Albumin: 3.8 g/dL — ABNORMAL LOW (ref 3.9–4.9)
Alkaline Phosphatase: 109 IU/L (ref 44–121)
Anion Gap: 16 mmol/L (ref 10.0–18.0)
BUN/Creatinine Ratio: 16 (ref 10–24)
BUN: 33 mg/dL — ABNORMAL HIGH (ref 8–27)
Bilirubin Total: 1.3 mg/dL — ABNORMAL HIGH (ref 0.0–1.2)
CO2: 21 mmol/L (ref 20–29)
Calcium: 9.1 mg/dL (ref 8.6–10.2)
Chloride: 102 mmol/L (ref 96–106)
Creatinine, Ser: 2.05 mg/dL — ABNORMAL HIGH (ref 0.76–1.27)
Globulin, Total: 2.2 g/dL (ref 1.5–4.5)
Glucose: 390 mg/dL — ABNORMAL HIGH (ref 70–99)
Potassium: 4.2 mmol/L (ref 3.5–5.2)
Sodium: 139 mmol/L (ref 134–144)
Total Protein: 6 g/dL (ref 6.0–8.5)
eGFR: 35 mL/min/1.73 — ABNORMAL LOW (ref >59)

## 2023-08-26 ENCOUNTER — Other Ambulatory Visit: Payer: Self-pay

## 2023-08-26 MED ORDER — TRIAMCINOLONE ACETONIDE 0.1 % EX CREA: TOPICAL_CREAM | Freq: Two times a day (BID) | CUTANEOUS | 1 refills | Status: AC

## 2023-08-26 MED ORDER — TRIAMCINOLONE ACETONIDE 0.1 % EX CREA: TOPICAL_CREAM | Freq: Two times a day (BID) | CUTANEOUS | 1 refills | Status: DC

## 2023-09-25 DIAGNOSIS — R9431 Abnormal electrocardiogram [ECG] [EKG]: Secondary | ICD-10-CM | POA: Diagnosis not present

## 2023-09-25 DIAGNOSIS — E785 Hyperlipidemia, unspecified: Secondary | ICD-10-CM | POA: Diagnosis not present

## 2023-09-25 DIAGNOSIS — R079 Chest pain, unspecified: Secondary | ICD-10-CM | POA: Diagnosis not present

## 2023-09-25 DIAGNOSIS — I517 Cardiomegaly: Secondary | ICD-10-CM | POA: Diagnosis not present

## 2023-09-25 DIAGNOSIS — I444 Left anterior fascicular block: Secondary | ICD-10-CM | POA: Diagnosis not present

## 2023-09-25 DIAGNOSIS — I252 Old myocardial infarction: Secondary | ICD-10-CM | POA: Diagnosis not present

## 2023-10-11 ENCOUNTER — Other Ambulatory Visit: Payer: Self-pay | Admitting: Internal Medicine

## 2023-10-13 ENCOUNTER — Other Ambulatory Visit: Payer: Self-pay

## 2023-10-13 MED ORDER — LOSARTAN POTASSIUM 100 MG PO TABS: 100.0000 mg | ORAL_TABLET | Freq: Every day | ORAL | 6 refills | Status: AC

## 2023-11-17 ENCOUNTER — Ambulatory Visit: Admitting: Internal Medicine

## 2023-11-18 ENCOUNTER — Other Ambulatory Visit: Payer: Self-pay

## 2023-11-20 ENCOUNTER — Other Ambulatory Visit: Payer: Self-pay | Admitting: Internal Medicine

## 2023-11-21 ENCOUNTER — Other Ambulatory Visit: Payer: Self-pay

## 2023-11-21 MED ORDER — EMPAGLIFLOZIN 25 MG PO TABS: 25.0000 mg | ORAL_TABLET | Freq: Every day | ORAL | 0 refills | Status: DC

## 2023-12-22 ENCOUNTER — Other Ambulatory Visit: Payer: Self-pay | Admitting: Internal Medicine

## 2024-01-19 ENCOUNTER — Other Ambulatory Visit: Payer: Self-pay | Admitting: Internal Medicine
